# Patient Record
Sex: Male | Born: 1960 | Race: White | Hispanic: No | Marital: Married | Smoking: Former smoker
Health system: Southern US, Community
[De-identification: ages and names within clinical notes are randomized; demographics above are authoritative.]

## PROBLEM LIST (undated history)

## (undated) DIAGNOSIS — R519 Headache, unspecified: Secondary | ICD-10-CM

## (undated) DIAGNOSIS — F419 Anxiety disorder, unspecified: Secondary | ICD-10-CM

## (undated) DIAGNOSIS — N400 Enlarged prostate without lower urinary tract symptoms: Secondary | ICD-10-CM

## (undated) DIAGNOSIS — K7460 Unspecified cirrhosis of liver: Secondary | ICD-10-CM

## (undated) DIAGNOSIS — R413 Other amnesia: Secondary | ICD-10-CM

## (undated) DIAGNOSIS — E119 Type 2 diabetes mellitus without complications: Secondary | ICD-10-CM

## (undated) DIAGNOSIS — Z87442 Personal history of urinary calculi: Secondary | ICD-10-CM

## (undated) DIAGNOSIS — F32A Depression, unspecified: Secondary | ICD-10-CM

## (undated) DIAGNOSIS — M1990 Unspecified osteoarthritis, unspecified site: Secondary | ICD-10-CM

## (undated) DIAGNOSIS — N200 Calculus of kidney: Secondary | ICD-10-CM

## (undated) DIAGNOSIS — F329 Major depressive disorder, single episode, unspecified: Secondary | ICD-10-CM

## (undated) HISTORY — PX: BUNIONECTOMY: SHX129

## (undated) HISTORY — DX: Unspecified osteoarthritis, unspecified site: M19.90

## (undated) HISTORY — PX: PENILE ADHESIONS LYSIS: SHX2198

## (undated) HISTORY — DX: Major depressive disorder, single episode, unspecified: F32.9

## (undated) HISTORY — DX: Other amnesia: R41.3

## (undated) HISTORY — PX: KNEE SURGERY: SHX244

## (undated) HISTORY — DX: Calculus of kidney: N20.0

## (undated) HISTORY — DX: Benign prostatic hyperplasia without lower urinary tract symptoms: N40.0

## (undated) HISTORY — DX: Headache, unspecified: R51.9

## (undated) HISTORY — PX: TONSILLECTOMY AND ADENOIDECTOMY: SUR1326

---

## 2007-06-09 HISTORY — PX: COLONOSCOPY: SHX174

## 2008-02-17 ENCOUNTER — Encounter: Admission: RE | Admit: 2008-02-17 | Discharge: 2008-02-17 | Payer: Self-pay | Admitting: Urology

## 2008-02-22 ENCOUNTER — Ambulatory Visit (HOSPITAL_BASED_OUTPATIENT_CLINIC_OR_DEPARTMENT_OTHER): Admission: RE | Admit: 2008-02-22 | Discharge: 2008-02-22 | Payer: Self-pay | Admitting: Urology

## 2008-02-22 ENCOUNTER — Encounter (INDEPENDENT_AMBULATORY_CARE_PROVIDER_SITE_OTHER): Payer: Self-pay | Admitting: Urology

## 2009-06-01 IMAGING — CR DG CHEST 2V
2 series · 2 of 2 positions shown · non-contrast
Comparison: None

CLINICAL DATA: Preop chest x-ray, hematuria

CHEST - 2 VIEW

[w chest pa]
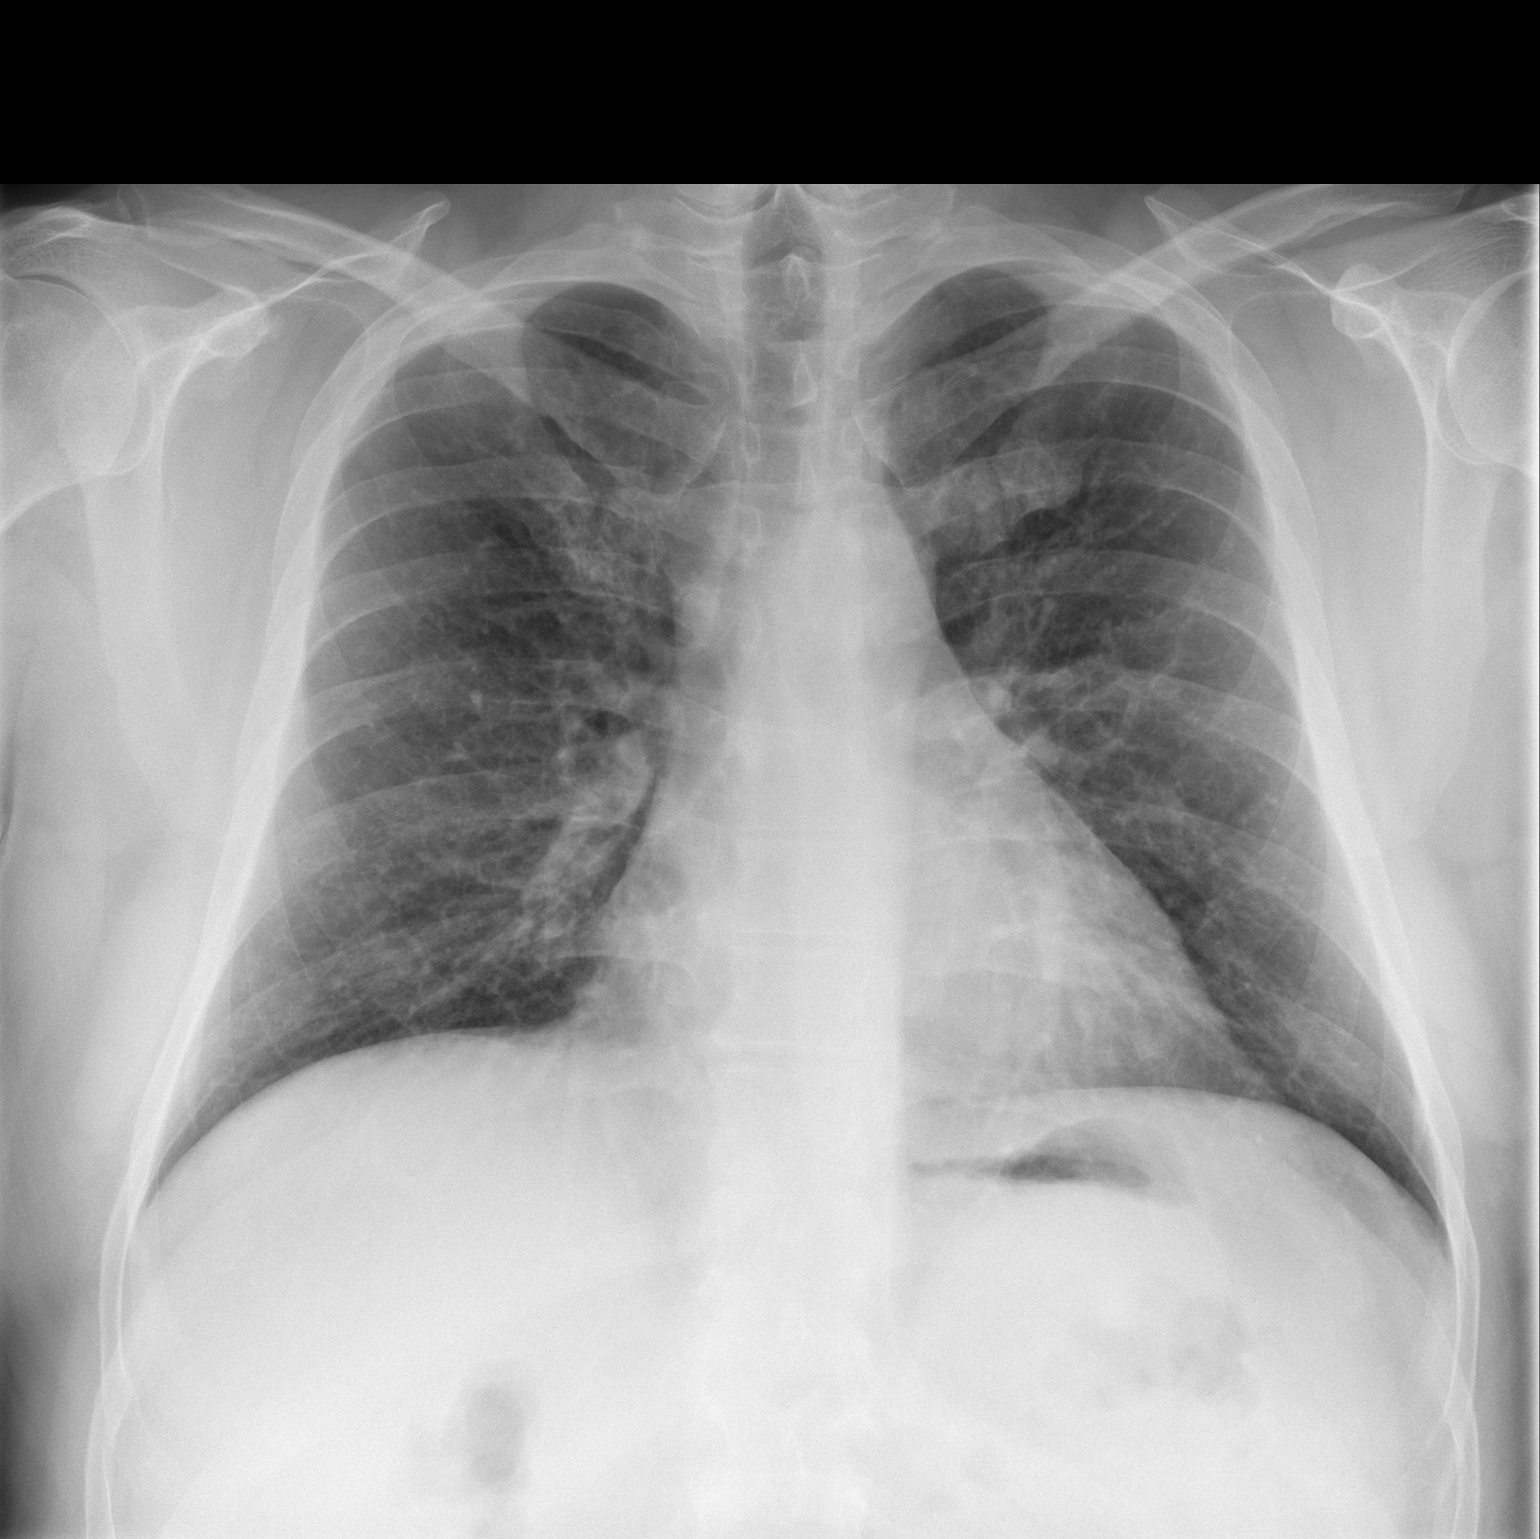

[w chest lat]
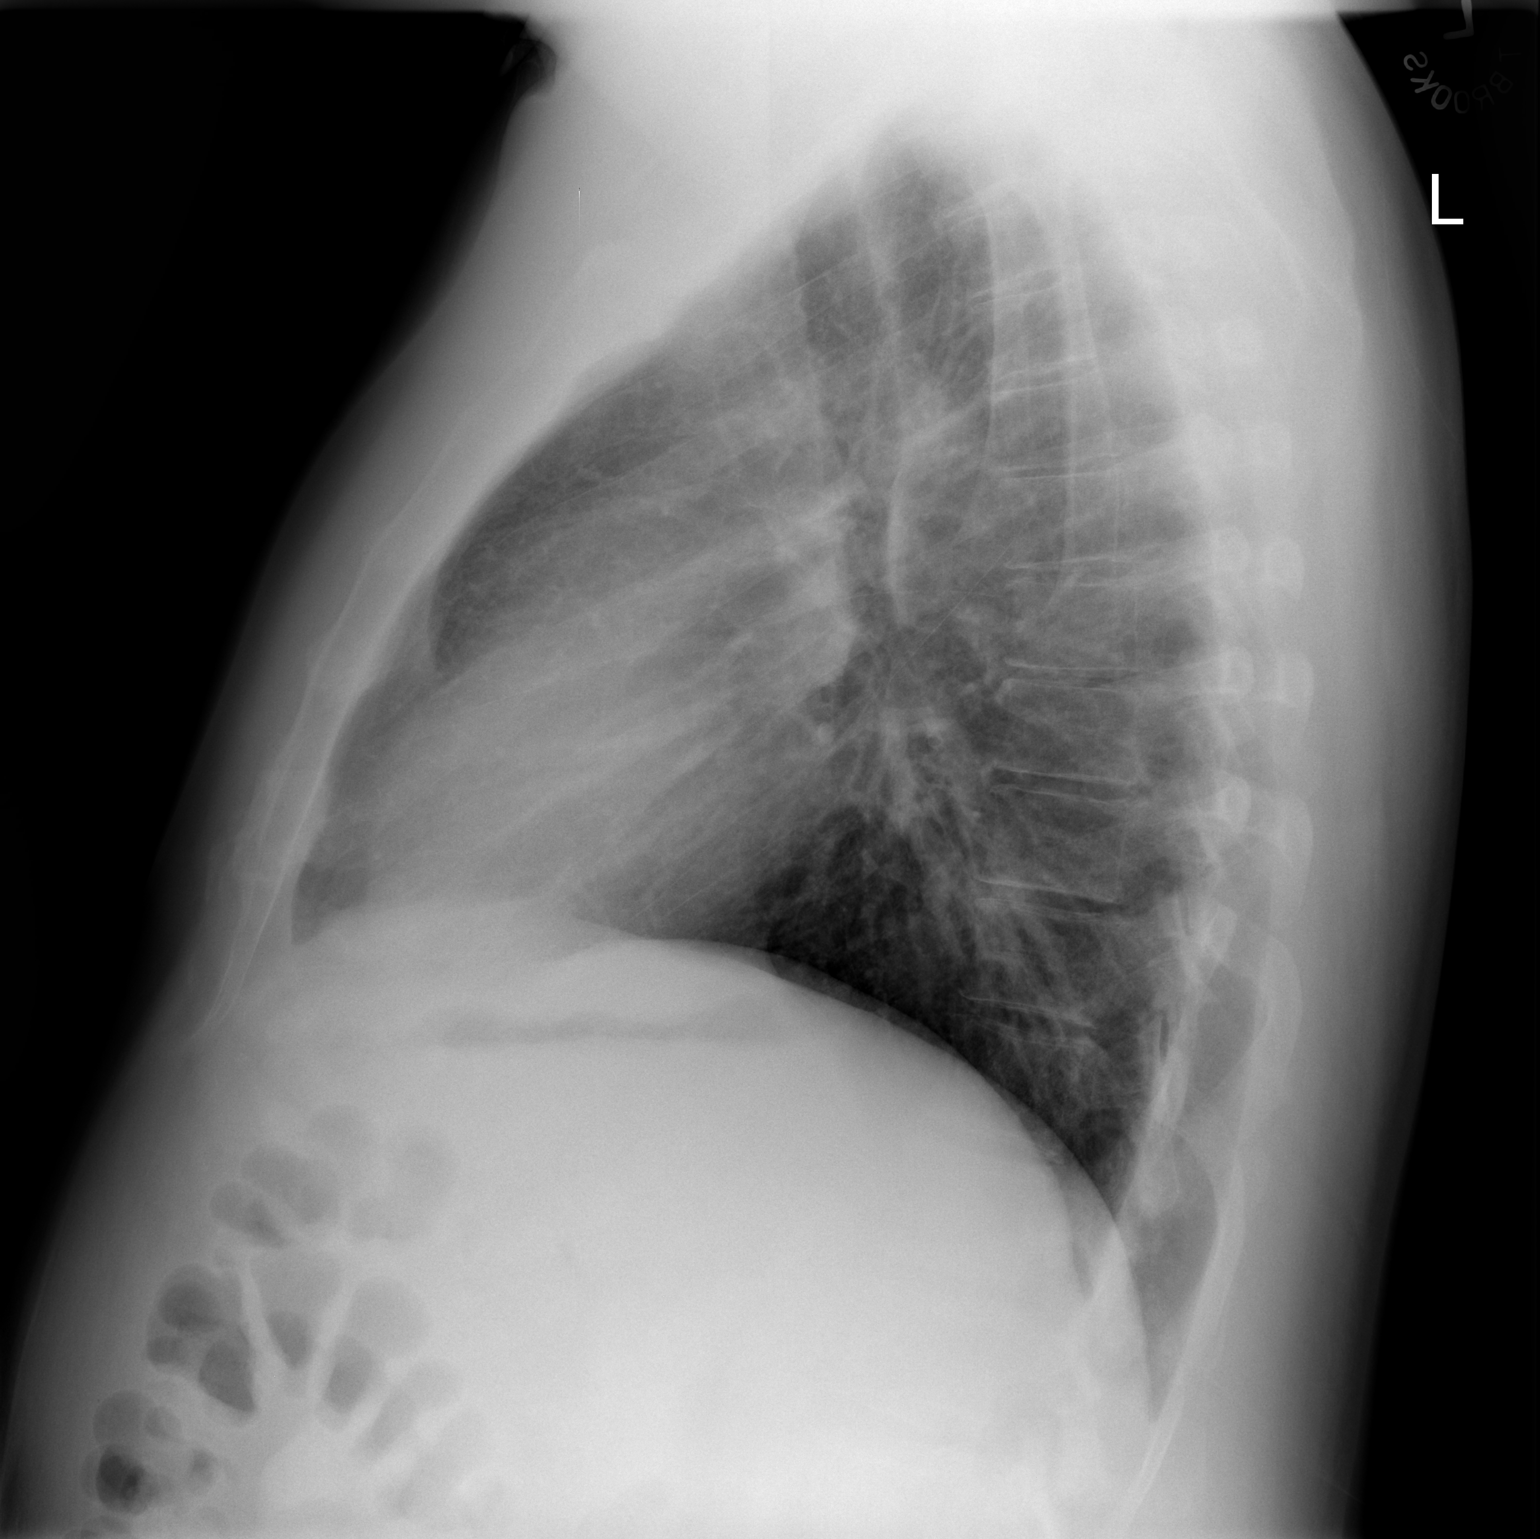

[2 of 2 positions shown; findings below may reference images not displayed]

FINDINGS: Two views the chest show lungs to be clear.  The heart is
within upper limits of normal.  No bony abnormality is seen.
IMPRESSION: No active lung disease.  Heart upper normal.

## 2011-02-17 NOTE — Op Note (Signed)
Malik Hill, Malik Hill              ACCOUNT NO.:  [id number]   MEDICAL RECORD NO.:  [id number]          PATIENT TYPE:  AMB   LOCATION:  [facility]                         FACILITY:  [facility]   PHYSICIAN:  Ronald L. Earlene Plater, M.D.  DATE OF BIRTH:  17-Jul-1961   DATE OF PROCEDURE:  02/22/2008  DATE OF DISCHARGE:                               OPERATIVE REPORT   DIAGNOSES:  1. Hematuria.  2. Positive cytology.  3. Urethral stricture disease.   OPERATIVE PROCEDURE:  1. Cystourethroscopy.  2. Dilatation of urethral stricture.  3. Bilateral renal pelvic washings.  4. Bilateral retrograde ureteropyelograms.  5. Bladder washings.  6. Placement of Foley catheter.   SURGEON:  Gaynelle Arabian, MD   ANESTHESIA:  LMA.   ESTIMATED BLOOD LOSS:  20 mL.   TUBES:  A 24-French 10-mL balloon Foley.   COMPLICATIONS:  None.   INDICATION FOR PROCEDURE:  Mr. Boydstun is a very nice 50 year old white  male who presented with some intermittent hematuria.  He underwent a  workup consisting of renal ultrasound and a CT scan that revealed really  no significant abnormalities.  He underwent an office cystoscopy which  revealed urethral stricture disease with no abnormalities.  However, he  had some cytologies that were positive.  After understanding risks,  benefits and alternatives, he has elected to proceed with the above  procedure.   PROCEDURE IN DETAIL:  The patient was placed in a supine position.  After proper LMA anesthesia, he was placed in the dorsal lithotomy  position, prepped and draped with Betadine in a sterile fashion.  Cystourethroscopy was performed with a 22.5-French Olympus panendoscope.  He was noted have a rather tight filamentous bulbar urethral stricture  that was at multiple levels for approximately a centimeter and a half.  A 0.03-French Sensor wire was placed past it and it was dilated to 75-  [place] with Heyman dilators.  Cystourethroscopy was then performed and  the bladder was  smooth-walled and efflux of clear urine was noted from  the normally placed ureteral orifices bilaterally.  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  The 6-French open-ended catheters were  placed into each renal pelvis and barbotage and right and left renal  cytologies were obtained with normal saline and submitted for cytology.  Right and left retrograde ureteropyelograms was then performed; both  appeared to have no filling defects, no hydronephrosis.  Both drained  well.  The entire ureter could be visualized and the orthotopic  ureterocele was noted near the right ureteral orifice, but there were no  lesions noted and both systems appeared to drain well.  Bladder washings  were then performed with normal saline and submitted to Pathology also.  The bladder was drained.  The panendoscope was visually removed and a 24-  French 10-mL balloon Foley was passed and the urine was noted to  irrigate clear.  The patient tolerated the procedure well with no  complications.  He was taken to the recovery room stable.      Ronald L. Earlene Plater, M.D.  Electronically Signed     RLD/MEDQ  D:  02/22/2008  T:  02/22/2008  Job:  [id number]

## 2011-05-27 ENCOUNTER — Encounter (INDEPENDENT_AMBULATORY_CARE_PROVIDER_SITE_OTHER): Payer: Self-pay

## 2011-07-01 LAB — BASIC METABOLIC PANEL
BUN: 5 — ABNORMAL LOW
Chloride: 105
GFR calc Af Amer: >60
GFR calc non Af Amer: >60

## 2011-07-01 LAB — CBC
HCT: 43.3
Hemoglobin: 14.6
RDW: 13.9

## 2011-07-01 LAB — URINALYSIS, ROUTINE W REFLEX MICROSCOPIC
Bilirubin Urine: NEGATIVE
Specific Gravity, Urine: 1.009
Urobilinogen, UA: 0.2

## 2013-10-05 HISTORY — PX: KNEE SURGERY: SHX244

## 2014-03-20 ENCOUNTER — Emergency Department (HOSPITAL_COMMUNITY): Payer: BC Managed Care – PPO

## 2014-03-20 ENCOUNTER — Encounter (HOSPITAL_COMMUNITY): Payer: Self-pay | Admitting: Emergency Medicine

## 2014-03-20 ENCOUNTER — Emergency Department (HOSPITAL_COMMUNITY)
Admission: EM | Admit: 2014-03-20 | Discharge: 2014-03-20 | Disposition: A | Discharge status: Home / Self Care | Payer: BC Managed Care – PPO | Attending: Emergency Medicine | Admitting: Emergency Medicine

## 2014-03-20 DIAGNOSIS — Z791 Long term (current) use of non-steroidal anti-inflammatories (NSAID): Secondary | ICD-10-CM | POA: Insufficient documentation

## 2014-03-20 DIAGNOSIS — Y9389 Activity, other specified: Secondary | ICD-10-CM | POA: Insufficient documentation

## 2014-03-20 DIAGNOSIS — Z79899 Other long term (current) drug therapy: Secondary | ICD-10-CM | POA: Insufficient documentation

## 2014-03-20 DIAGNOSIS — Z87891 Personal history of nicotine dependence: Secondary | ICD-10-CM | POA: Insufficient documentation

## 2014-03-20 DIAGNOSIS — S99929A Unspecified injury of unspecified foot, initial encounter: Principal | ICD-10-CM

## 2014-03-20 DIAGNOSIS — X500XXA Overexertion from strenuous movement or load, initial encounter: Secondary | ICD-10-CM | POA: Insufficient documentation

## 2014-03-20 DIAGNOSIS — Z8659 Personal history of other mental and behavioral disorders: Secondary | ICD-10-CM | POA: Insufficient documentation

## 2014-03-20 DIAGNOSIS — E119 Type 2 diabetes mellitus without complications: Secondary | ICD-10-CM | POA: Insufficient documentation

## 2014-03-20 DIAGNOSIS — M25561 Pain in right knee: Secondary | ICD-10-CM

## 2014-03-20 DIAGNOSIS — S8990XA Unspecified injury of unspecified lower leg, initial encounter: Secondary | ICD-10-CM | POA: Insufficient documentation

## 2014-03-20 DIAGNOSIS — Y929 Unspecified place or not applicable: Secondary | ICD-10-CM | POA: Insufficient documentation

## 2014-03-20 DIAGNOSIS — S99919A Unspecified injury of unspecified ankle, initial encounter: Principal | ICD-10-CM

## 2014-03-20 HISTORY — DX: Type 2 diabetes mellitus without complications: E11.9

## 2014-03-20 HISTORY — DX: Depression, unspecified: F32.A

## 2014-03-20 HISTORY — DX: Major depressive disorder, single episode, unspecified: F32.9

## 2014-03-20 HISTORY — DX: Anxiety disorder, unspecified: F41.9

## 2014-03-20 MED ORDER — NAPROXEN 500 MG PO TABS: 500.0000 mg | ORAL_TABLET | Freq: Two times a day (BID) | ORAL | Status: DC

## 2014-03-20 MED ORDER — HYDROCODONE-ACETAMINOPHEN 7.5-325 MG PO TABS: 1.0000 | ORAL_TABLET | Freq: Four times a day (QID) | ORAL | Status: DC | PRN

## 2014-03-20 NOTE — ED Notes (Signed)
Pt verbalized understanding of no driving and to use caution within 4 hours of taking pain med due to med causes drowsiness

## 2014-03-20 NOTE — ED Notes (Signed)
T. Triplett, PA at bedside. 

## 2014-03-20 NOTE — Discharge Instructions (Signed)
Knee Pain Knee pain can be a result of an injury or other medical conditions. Treatment will depend on the cause of your pain. HOME CARE  Only take medicine as told by your doctor.  Keep a healthy weight. Being overweight can make the knee hurt more.  Stretch before exercising or playing sports.  If there is constant knee pain, change the way you exercise. Ask your doctor for advice.  Make sure shoes fit well. Choose the right shoe for the sport or activity.  Protect your knees. Wear kneepads if needed.  Rest when you are tired. GET HELP RIGHT AWAY IF:   Your knee pain does not stop.  Your knee pain does not get better.  Your knee joint feels hot to the touch.  You have a fever. MAKE SURE YOU:   Understand these instructions.  Will watch this condition.  Will get help right away if you are not doing well or get worse. Document Released: 12/18/2008 Document Revised: 12/14/2011 Document Reviewed: 12/18/2008 Exit[employer] Patient Information 2014 [employer].  

## 2014-03-20 NOTE — ED Notes (Signed)
Pt lifting and heard a pop to right knee. Has been hurting since and limping some. Mild swelling noted.

## 2014-03-23 NOTE — ED Provider Notes (Signed)
Medical screening examination/treatment/procedure(s) were performed by non-physician practitioner and as supervising physician I was immediately available for consultation/collaboration.   EKG Interpretation None        Joseph L Zammit, MD 03/23/14 1547 

## 2014-03-23 NOTE — ED Provider Notes (Signed)
CSN: [id number]     Arrival date & time 03/20/14  1249 History   First MD Initiated Contact with Patient 03/20/14 1341     Chief Complaint  Patient presents with  . Knee Pain     (Consider location/radiation/quality/duration/timing/severity/associated sxs/prior Treatment) Patient is a 53 y.o. male presenting with knee pain. The history is provided by the patient.  Knee Pain Location:  Knee Time since incident:  1 day Injury: yes   Mechanism of injury comment:  Bending and lifting when he heard a "pop" to the right knee Knee location:  R knee Pain details:    Quality:  Aching and throbbing   Radiates to:  Does not radiate   Severity:  Moderate   Onset quality:  Sudden   Timing:  Constant   Progression:  Unchanged Chronicity:  New Dislocation: no   Foreign body present:  No foreign bodies Prior injury to area:  No Relieved by:  Rest Worsened by:  Activity, bearing weight and flexion Ineffective treatments:  NSAIDs Associated symptoms: decreased ROM and swelling   Associated symptoms: no back pain, no fever, no neck pain, no numbness, no stiffness and no tingling     Past Medical History  Diagnosis Date  . Diabetes mellitus without complication   . Depression   . Anxiety    Past Surgical History  Procedure Laterality Date  . Colonoscopy  06/09/2007  . Knee surgery Left    History reviewed. No pertinent family history. History  Substance Use Topics  . Smoking status: Former Games developermoker  . Smokeless tobacco: Not on file  . Alcohol Use: No    Review of Systems  Constitutional: Negative for fever and chills.  Gastrointestinal: Negative for nausea and vomiting.  Genitourinary: Negative for dysuria and difficulty urinating.  Musculoskeletal: Positive for arthralgias. Negative for back pain, joint swelling, neck pain and stiffness.       Right knee pain and swelling  Skin: Negative for color change and wound.  Neurological: Negative for dizziness, weakness,  light-headedness and numbness.  All other systems reviewed and are negative.     Allergies  Review of patient's allergies indicates no known allergies.  Home Medications   Prior to Admission medications   Medication Sig Start Date End Date Taking? Authorizing Provider  HYDROcodone-acetaminophen (NORCO) 7.5-325 MG per tablet Take 1 tablet by mouth every 6 (six) hours as needed for moderate pain. 03/20/14   Tammy L. Triplett, PA-C  naproxen (NAPROSYN) 500 MG tablet Take 1 tablet (500 mg total) by mouth 2 (two) times daily. 03/20/14   Tammy L. Triplett, PA-C   BP 140/86  Pulse 100  Temp(Src) 98.1 F (36.7 C) (Oral)  Resp 18  Ht 6\' 5"  (1.956 m)  Wt 287 lb (130.182 kg)  BMI 34.03 kg/m2  SpO2 96% Physical Exam  Nursing note and vitals reviewed. Constitutional: He is oriented to person, place, and time. He appears well-developed and well-nourished. No distress.  HENT:  Head: Normocephalic and atraumatic.  Cardiovascular: Normal rate, regular rhythm, normal heart sounds and intact distal pulses.   No murmur heard. Pulmonary/Chest: Effort normal and breath sounds normal. No respiratory distress.  Musculoskeletal: He exhibits edema and tenderness.       Right knee: He exhibits swelling. He exhibits no ecchymosis, no deformity, no laceration, no erythema and normal alignment. Tenderness found. No patellar tendon tenderness noted.       Legs: Localized ttp of the medial upper right knee . Mild to moderate STS present. No erythema,  or step-off deformity.  DP pulse brisk, distal sensation intact. Calf is soft and NT. Compartments of the right leg soft  Neurological: He is alert and oriented to person, place, and time. He exhibits normal muscle tone. Coordination normal.  Skin: Skin is warm and dry. No erythema.    ED Course  Procedures (including critical care time) Labs Review Labs Reviewed - No data to display  Imaging Review Dg Knee Complete 4 Views Right  03/20/2014   CLINICAL  DATA:  Pain post trauma  EXAM: RIGHT KNEE - COMPLETE 4+ VIEW  COMPARISON:  None.  FINDINGS: Frontal, lateral, and bilateral oblique views were obtained. There is a moderate joint effusion. There is no demonstrable fracture or dislocation. There is spurring along the anterior superior patella. There is no appreciable joint space narrowing. There is minimal spurring medially.  IMPRESSION: There is a moderate joint effusion. No fracture is appreciable. There is mild osteoarthritic change.   Electronically Signed   By: Bretta BangWilliam  Woodruff M.D.   On: 03/20/2014 14:16    EKG Interpretation None      MDM   Final diagnoses:  Knee pain, right    XR discussed with pt.  Sx's are concerning for possible quad tendon/ muscle injury.  Plan includes knee immobilizer, ice, elevate and close f/u with orthopedics.  Referral info given.  Pt agrees to arrange f/u with Dr. Romeo AppleHarrison for latera this week.  Also advised pt to return here if sx's worsen, he agrees to plan.  rx for naprosyn and vicodin.  Knee immob applied, pain improved, remains NV intact.      Tammy L. Trisha Mangleriplett, PA-C 03/23/14 1517

## 2016-06-23 ENCOUNTER — Emergency Department (HOSPITAL_COMMUNITY)
Admission: EM | Admit: 2016-06-23 | Discharge: 2016-06-23 | Disposition: A | Discharge status: Home / Self Care | Payer: BLUE CROSS/BLUE SHIELD | Attending: Dermatology | Admitting: Dermatology

## 2016-06-23 ENCOUNTER — Encounter (HOSPITAL_COMMUNITY): Payer: Self-pay | Admitting: Emergency Medicine

## 2016-06-23 DIAGNOSIS — Z5321 Procedure and treatment not carried out due to patient leaving prior to being seen by health care provider: Secondary | ICD-10-CM | POA: Insufficient documentation

## 2016-06-23 DIAGNOSIS — Z87891 Personal history of nicotine dependence: Secondary | ICD-10-CM | POA: Insufficient documentation

## 2016-06-23 DIAGNOSIS — E119 Type 2 diabetes mellitus without complications: Secondary | ICD-10-CM | POA: Insufficient documentation

## 2016-06-23 DIAGNOSIS — R319 Hematuria, unspecified: Secondary | ICD-10-CM | POA: Insufficient documentation

## 2016-06-23 NOTE — ED Triage Notes (Signed)
Pt feel in April and broke a vertabrea in his back and has been going to a Md regularly. Pt has been seeing a Landchiropractor.  Pt is complaining of lower back pain. Pt stated that when he went to the bathroom this morning and had blood in his urine. Pt states that he had a large kidney stone back in April but he has not passed it since then. Pt states that he is having lower back pain.

## 2016-06-23 NOTE — ED Notes (Signed)
Pt told staff he was going to his pcp.

## 2016-08-10 ENCOUNTER — Emergency Department (HOSPITAL_COMMUNITY)
Admission: EM | Admit: 2016-08-10 | Discharge: 2016-08-10 | Disposition: A | Discharge status: Home / Self Care | Payer: BLUE CROSS/BLUE SHIELD | Attending: Emergency Medicine | Admitting: Emergency Medicine

## 2016-08-10 ENCOUNTER — Encounter (HOSPITAL_COMMUNITY): Payer: Self-pay | Admitting: Emergency Medicine

## 2016-08-10 ENCOUNTER — Emergency Department (HOSPITAL_COMMUNITY): Payer: BLUE CROSS/BLUE SHIELD

## 2016-08-10 DIAGNOSIS — R161 Splenomegaly, not elsewhere classified: Secondary | ICD-10-CM | POA: Diagnosis not present

## 2016-08-10 DIAGNOSIS — R1013 Epigastric pain: Secondary | ICD-10-CM

## 2016-08-10 DIAGNOSIS — N200 Calculus of kidney: Secondary | ICD-10-CM

## 2016-08-10 DIAGNOSIS — R110 Nausea: Secondary | ICD-10-CM | POA: Diagnosis not present

## 2016-08-10 DIAGNOSIS — R634 Abnormal weight loss: Secondary | ICD-10-CM | POA: Insufficient documentation

## 2016-08-10 DIAGNOSIS — R630 Anorexia: Secondary | ICD-10-CM | POA: Diagnosis not present

## 2016-08-10 DIAGNOSIS — E119 Type 2 diabetes mellitus without complications: Secondary | ICD-10-CM | POA: Insufficient documentation

## 2016-08-10 DIAGNOSIS — Z87891 Personal history of nicotine dependence: Secondary | ICD-10-CM | POA: Diagnosis not present

## 2016-08-10 LAB — I-STAT TROPONIN, ED: TROPONIN I, POC: 0 ng/mL (ref 0.00–0.08)

## 2016-08-10 LAB — URINALYSIS, ROUTINE W REFLEX MICROSCOPIC
Bilirubin Urine: NEGATIVE
GLUCOSE, UA: NEGATIVE mg/dL
HGB URINE DIPSTICK: NEGATIVE
Ketones, ur: NEGATIVE mg/dL
Nitrite: NEGATIVE
PH: 5.5 (ref 5.0–8.0)
Protein, ur: NEGATIVE mg/dL
SPECIFIC GRAVITY, URINE: 1.017 (ref 1.005–1.030)

## 2016-08-10 LAB — CBC
HEMATOCRIT: 44.4 % (ref 39.0–52.0)
HEMOGLOBIN: 14.6 g/dL (ref 13.0–17.0)
MCH: 28.1 pg (ref 26.0–34.0)
MCHC: 32.9 g/dL (ref 30.0–36.0)
MCV: 85.4 fL (ref 78.0–100.0)
Platelets: 222 10*3/uL (ref 150–400)
RBC: 5.2 MIL/uL (ref 4.22–5.81)
RDW: 13.6 % (ref 11.5–15.5)
WBC: 5.8 10*3/uL (ref 4.0–10.5)

## 2016-08-10 LAB — COMPREHENSIVE METABOLIC PANEL
ALBUMIN: 4.5 g/dL (ref 3.5–5.0)
ALT: 36 U/L (ref 17–63)
ANION GAP: 7 (ref 5–15)
AST: 31 U/L (ref 15–41)
Alkaline Phosphatase: 60 U/L (ref 38–126)
BUN: 13 mg/dL (ref 6–20)
CHLORIDE: 103 mmol/L (ref 101–111)
CO2: 24 mmol/L (ref 22–32)
Calcium: 9.3 mg/dL (ref 8.9–10.3)
Creatinine, Ser: 1.08 mg/dL (ref 0.61–1.24)
GFR calc non Af Amer: >60 mL/min (ref >60)
Glucose, Bld: 80 mg/dL (ref 65–99)
Potassium: 3.6 mmol/L (ref 3.5–5.1)
SODIUM: 134 mmol/L — AB (ref 135–145)
Total Bilirubin: 1 mg/dL (ref 0.3–1.2)
Total Protein: 7.7 g/dL (ref 6.5–8.1)

## 2016-08-10 LAB — URINE MICROSCOPIC-ADD ON
BACTERIA UA: NONE SEEN
RBC / HPF: NONE SEEN RBC/hpf (ref 0–5)
Squamous Epithelial / LPF: NONE SEEN

## 2016-08-10 LAB — LIPASE, BLOOD: LIPASE: 41 U/L (ref 11–51)

## 2016-08-10 MED ORDER — ONDANSETRON 8 MG PO TBDP: 8.0000 mg | ORAL_TABLET | Freq: Three times a day (TID) | ORAL | 0 refills | Status: DC | PRN

## 2016-08-10 MED ORDER — ONDANSETRON 8 MG PO TBDP
8.0000 mg | ORAL_TABLET | Freq: Once | ORAL | Status: AC
  Administered 2016-08-10: 8 mg via ORAL
  Filled 2016-08-10: qty 1

## 2016-08-10 MED ORDER — GI COCKTAIL ~~LOC~~
30.0000 mL | Freq: Once | ORAL | Status: AC
  Administered 2016-08-10: 30 mL via ORAL
  Filled 2016-08-10: qty 30

## 2016-08-10 NOTE — ED Triage Notes (Signed)
Pt reports abd x 1 month, was seen by PCP , was prescribed Protonix and other meds for acid reflux. Pt reports no relief and pain is persistent in LLQ. Hx kidney stone yet reports doesn't feel like one . Also reports oliguria. Denies dysuria.  Reports nausea yet no emesis.

## 2016-08-10 NOTE — ED Provider Notes (Signed)
[facility]-EMERGENCY DEPT Provider Note   CSN: [id number] Arrival date & time: 08/10/16  1239     History   Chief Complaint Chief Complaint  Patient presents with  . Abdominal Pain    HPI Malik Hill is a 55 y.o. male with a PMHx of anxiety, depression, nephrolithiasis, urethral strictures s/p repair, IBS, and DM2, who presents to the ED with complaints of 6 weeks of epigastric abdominal pain. Patient states that several months ago he broke of vertebrae in his back, was prescribed Percocet which he was on for several weeks, and when he came off of it his symptoms started and have persisted since then. He went to his PCPs office on Monday last week and was told that he likely had an ulcer so he was prescribed Protonix and Carafate, which has helped, but this afternoon the pain returned and was more severe so he came to the emergency room. Patient states that he thought that all of his symptoms were related to coming off the Percocet, and being very anxious, but this morning it concerned him in the pain was worse than it had been. He describes his pain as 6/10 intermittent burning in the epigastrium radiating into the left upper quadrant worse with anxiety and stress as well as eating, and improved with Protonix and Carafate. He has not tried anything else. States that currently his pain has resolved. Associated symptoms include nausea and decreased appetite. He states that he hasn't been eating much and has lost approximately 12 pounds in the last 6 weeks.   He denies any fevers, chills, chest pain, shortness breath, vomiting, diarrhea, constipation, melena, hematochezia, obstipation, dysuria, hematuria, numbness, tingling, focal weakness, recent travel, antibiotic use, sick contacts, suspicious food intake, alcohol use, chronic NSAID use, or prior abdominal surgeries. He last ate 2 pieces of toast, a boost drink, and some bacon at 7 AM. His GI specialist is Dr. Dionicia Ableraman, last had a colonoscopy ~8154yrs ago,  states he's supposed to go every 2-3 yrs to have them because his sister had colon cancer so he's high risk.    The history is provided by the patient and medical records. No language interpreter was used.  Abdominal Pain   This is a recurrent problem. The current episode started more than 1 week ago. The problem occurs daily. The problem has been gradually improving. The pain is associated with eating. The pain is located in the epigastric region. The quality of the pain is burning. The pain is at a severity of 6/10. The pain is moderate. Associated symptoms include nausea. Pertinent negatives include fever, diarrhea, flatus, hematochezia, melena, vomiting, constipation, dysuria, hematuria, arthralgias and myalgias. The symptoms are aggravated by eating (and stress/anxiety). Relieved by: protonix and carafate. His past medical history is significant for irritable bowel syndrome.    Past Medical History:  Diagnosis Date  . Anxiety   . Depression   . Diabetes mellitus without complication (HCC)     There are no active problems to display for this patient.   Past Surgical History:  Procedure Laterality Date  . COLONOSCOPY  06/09/2007  . KNEE SURGERY Left   . PENILE ADHESIONS LYSIS         Home Medications    Prior to Admission medications   Medication Sig Start Date End Date Taking? Authorizing Provider  HYDROcodone-acetaminophen (NORCO) 7.5-325 MG per tablet Take 1 tablet by mouth every 6 (six) hours as needed for moderate pain. 03/20/14   Tammy Triplett, PA-C  naproxen (NAPROSYN) 500 MG  tablet Take 1 tablet (500 mg total) by mouth 2 (two) times daily. 03/20/14   Tammy Triplett, PA-C    Family History No family history on file.  Social History Social History  Substance Use Topics  . Smoking status: Former Games developermoker  . Smokeless tobacco: Never Used  . Alcohol use No     Allergies   Penicillins   Review of Systems Review of Systems  Constitutional: Positive for appetite  change (decreased) and unexpected weight change (12# in 6 wks). Negative for chills and fever.  Respiratory: Negative for shortness of breath.   Cardiovascular: Negative for chest pain.  Gastrointestinal: Positive for abdominal pain and nausea. Negative for blood in stool, constipation, diarrhea, flatus, hematochezia, melena and vomiting.  Genitourinary: Negative for dysuria and hematuria.  Musculoskeletal: Negative for arthralgias and myalgias.  Skin: Negative for color change.  Allergic/Immunologic: Positive for immunocompromised state (DM2).  Neurological: Negative for weakness and numbness.  Psychiatric/Behavioral: Negative for confusion.   10 Systems reviewed and are negative for acute change except as noted in the HPI.   Physical Exam Updated Vital Signs BP 128/91   Pulse 104   Temp 98.5 F (36.9 C) (Oral)   Resp 18   Wt 103.4 kg   SpO2 100%   BMI 27.04 kg/m   Physical Exam  Constitutional: He is oriented to person, place, and time. Vital signs are normal. He appears well-developed and well-nourished.  Non-toxic appearance. No distress.  Afebrile, nontoxic, NAD  HENT:  Head: Normocephalic and atraumatic.  Mouth/Throat: Oropharynx is clear and moist and mucous membranes are normal.  Eyes: Conjunctivae and EOM are normal. Right eye exhibits no discharge. Left eye exhibits no discharge.  Neck: Normal range of motion. Neck supple.  Cardiovascular: Normal rate, regular rhythm, normal heart sounds and intact distal pulses.  Exam reveals no gallop and no friction rub.   No murmur heard. Pulmonary/Chest: Effort normal and breath sounds normal. No respiratory distress. He has no decreased breath sounds. He has no wheezes. He has no rhonchi. He has no rales.  Abdominal: Soft. Normal appearance and bowel sounds are normal. He exhibits no distension. There is tenderness in the right upper quadrant and epigastric area. There is no rigidity, no rebound, no guarding, no CVA tenderness, no  tenderness at McBurney's point and negative Murphy's sign (elicits pain but still able to inspire).    Soft, nondistended, +BS throughout, with moderate RUQ and epigastric TTP, no r/g/r, pain elicited with murphy's exam but pt still able to fully inspire, neg mcburney's, no CVA TTP   Musculoskeletal: Normal range of motion.  Neurological: He is alert and oriented to person, place, and time. He has normal strength. No sensory deficit.  Skin: Skin is warm, dry and intact. No rash noted.  Psychiatric: He has a normal mood and affect.  Nursing note and vitals reviewed.    ED Treatments / Results  Labs (all labs ordered are listed, but only abnormal results are displayed) Labs Reviewed  COMPREHENSIVE METABOLIC PANEL - Abnormal; Notable for the following:       Result Value   Sodium 134 (*)    All other components within normal limits  URINALYSIS, ROUTINE W REFLEX MICROSCOPIC (NOT AT [facility]) - Abnormal; Notable for the following:    Leukocytes, UA TRACE (*)    All other components within normal limits  URINE CULTURE  LIPASE, BLOOD  CBC  URINE MICROSCOPIC-ADD ON  I-STAT TROPOININ, ED    EKG  EKG Interpretation  Date/Time:  Monday August 10 2016 17:23:52 EST Ventricular Rate:  95 PR Interval:    QRS Duration: 90 QT Interval:  341 QTC Calculation: 429 R Axis:   80 Text Interpretation:  Sinus rhythm Borderline T wave abnormalities Confirmed by Ranae Palms  MD, DAVID ([id number]) on 08/10/2016 6:38:43 PM       Radiology US Abdomen Complete  Result Date: 08/10/2016 CLINICAL DATA:  Epigastric and left upper quadrant abdominal pain x1 week EXAM: ABDOMEN ULTRASOUND COMPLETE COMPARISON:  CT abdomen/ pelvis dated 07/27/2010 FINDINGS: Gallbladder: No gallstones, gallbladder wall thickening, or pericholecystic fluid. Negative sonographic Murphy's sign. Common bile duct: Diameter: 4 mm Liver: Hyperechoic hepatic parenchyma. No focal hepatic lesion is seen. Incomplete visualization of left hepatic  lobe due to bowel gas. IVC: Poorly visualized due to overlying bowel gas. Pancreas: Not visualized due to overlying bowel gas. Spleen: Splenomegaly, measuring 14.3 x 15.8 x 8.5 cm (calculated volume 1001 mL). Right Kidney: Length: 12.4 cm.  No mass or hydronephrosis. Left Kidney: Length: 13.9 cm. 9 mm lower pole calculus. No hydronephrosis. Abdominal aorta: No aneurysm visualized. Other findings: None. IMPRESSION: Hyperechoic hepatic parenchyma, suggesting hepatic steatosis. Splenomegaly, measuring up to 15.8 cm in max dimension (calculated volume 1001 mL). 9 mm left lower pole renal calculus.  No hydronephrosis. Electronically Signed   By: Charline Bills M.D.   On: 08/10/2016 17:22    Procedures Procedures (including critical care time)  Medications Ordered in ED Medications  ondansetron (ZOFRAN-ODT) disintegrating tablet 8 mg (8 mg Oral Given 08/10/16 1641)  gi cocktail (Maalox,Lidocaine,Donnatal) (30 mLs Oral Given 08/10/16 1642)     Initial Impression / Assessment and Plan / ED Course  I have reviewed the triage vital signs and the nursing notes.  Pertinent labs & imaging results that were available during my care of the patient were reviewed by me and considered in my medical decision making (see chart for details).  Clinical Course     55 y.o. male here with ongoing epigastric pain and nausea, loss of appetite and subsequent weight loss x6 wks, intermittent with times of anxiety/stress; PCP diagnosed him with presumptive ulcer, started on carafate and protonix which has helped, but pain this morning was more severe so he came to the ER. Pain completely resolved upon arrival, declines wanting anything for pain. On exam, epigastric and RUQ TTP, neg murphy's but elicits pain during murphy's exam. Lipase WNL, CMP unremarkable, CBC WNL; U/A pending. Will add-on EKG and troponin to ensure this is not a cardiac etiology; will also get U/A. Will give GI cocktail and zofran, and reassess  shortly  7:17 PM U/A with few WBCs but otherwise unremarkable, doubt UTI, will send for culture. Trop neg. EKG unremarkable. U/S with splenomegaly and L lower renal pole calculus, and some findings of hepatic steatosis (the latter two are findings pt knew about already, chronic and unchanged per his reports). Pt feeling much better, able to tolerate PO well here. Discussed that he needs to see his GI specialist for likely EGD to eval for PUD/gastritis/etc which is what his symptoms seem to be consistent with, and for his colonoscopy since he's due for that and due to the weight loss and FHx of GI cancer this would be indicated for further evaluation; also to f/up on the splenomegaly finding seen on U/S today. Doubt need for further emergent work up for this right now. Will d/c home with zofran, continue carafate and protonix, tylenol and OTC meds for symptom control, diet modifications discussed, f/up with GI in 1wk  for ongoing eval and management. I explained the diagnosis and have given explicit precautions to return to the ER including for any other new or worsening symptoms. The patient understands and accepts the medical plan as it's been dictated and I have answered their questions. Discharge instructions concerning home care and prescriptions have been given. The patient is STABLE and is discharged to home in good condition.   Final Clinical Impressions(s) / ED Diagnoses   Final diagnoses:  Epigastric abdominal pain  Nausea  Splenomegaly  Decreased appetite  Weight loss, unintentional  Left nephrolithiasis    New Prescriptions New Prescriptions   ONDANSETRON (ZOFRAN ODT) 8 MG DISINTEGRATING TABLET    Take 1 tablet (8 mg total) by mouth every 8 (eight) hours as needed for nausea or vomiting.     France Ravens Camprubi-Soms, PA-C 08/10/16 1933    Loren Racer, MD 08/10/16 [phone]

## 2016-08-10 NOTE — Discharge Instructions (Signed)
Your abdominal pain is likely from gastritis or an ulcer, but could be from other causes and you will need to follow up for further evaluation. Your ultrasound today revealed an enlarged spleen, you will need to see your GI doctor for evaluation of this as well. Continue taking your carafate and protonix as directed. You may use over the counter zantac for additional relief, and avoid spicy/fatty/acidic foods, avoid soda/coffee/tea/alcohol. Avoid laying down flat within 30 minutes of eating. Avoid NSAIDs like ibuprofen/aleve/motrin/etc on an empty stomach. May consider using over the counter tums/maalox as needed for additional relief. Use zofran as directed as needed for nausea. Use tylenol as needed for pain. Follow up with your gastroenterologist in one week for ongoing evaluation of your abdominal pain and enlarged spleen. Return to the ER for changes or worsening symptoms.  Abdominal (belly) pain can be caused by many things. Your caregiver performed an examination and possibly ordered blood/urine tests and imaging (CT scan, x-rays, ultrasound). Many cases can be observed and treated at home after initial evaluation in the emergency department. Even though you are being discharged home, abdominal pain can be unpredictable. Therefore, you need a repeated exam if your pain does not resolve, returns, or worsens. Most patients with abdominal pain don't have to be admitted to the hospital or have surgery, but serious problems like appendicitis and gallbladder attacks can start out as nonspecific pain. Many abdominal conditions cannot be diagnosed in one visit, so follow-up evaluations are very important. SEEK IMMEDIATE MEDICAL ATTENTION IF YOU DEVELOP ANY OF THE FOLLOWING SYMPTOMS: The pain does not go away or becomes severe.  A temperature above 101 develops.  Repeated vomiting occurs (multiple episodes).  The pain becomes localized to portions of the abdomen. The right side could possibly be appendicitis.  In an adult, the left lower portion of the abdomen could be colitis or diverticulitis.  Blood is being passed in stools or vomit (bright red or black tarry stools).  Return also if you develop chest pain, difficulty breathing, dizziness or fainting, or become confused, poorly responsive, or inconsolable (young children). The constipation stays for more than 4 days.  There is belly (abdominal) or rectal pain.  You do not seem to be getting better.

## 2016-08-10 NOTE — ED Notes (Signed)
Ultrasound at bedside

## 2016-08-12 LAB — URINE CULTURE: CULTURE: NO GROWTH

## 2017-03-25 ENCOUNTER — Encounter: Payer: Self-pay | Admitting: Registered"

## 2017-03-25 ENCOUNTER — Encounter: Payer: BC Managed Care – PPO | Attending: Family Medicine | Admitting: Registered"

## 2017-03-25 DIAGNOSIS — E119 Type 2 diabetes mellitus without complications: Secondary | ICD-10-CM | POA: Diagnosis not present

## 2017-03-25 DIAGNOSIS — Z713 Dietary counseling and surveillance: Secondary | ICD-10-CM | POA: Insufficient documentation

## 2017-03-25 DIAGNOSIS — E118 Type 2 diabetes mellitus with unspecified complications: Secondary | ICD-10-CM

## 2017-03-25 DIAGNOSIS — I10 Essential (primary) hypertension: Secondary | ICD-10-CM | POA: Diagnosis not present

## 2017-03-25 NOTE — Progress Notes (Signed)
Diabetes Self-Management Education  Visit Type: First/Initial  Appt. Start Time: 1030 Appt. End Time: 1200  03/25/2017  Mr. Lorette AngJeffrey Femia,  identified by name and date of birth, is a 56 y.o. male with a diagnosis of Diabetes: Type 2.   ASSESSMENT Pt ([facility] [employer] states he was reluctant to come in because he believed he would be given a restrictive diet that would be hard to follow and take the enjoyment out of life. Pt states he doesn't want to admit to having diabetes and honest about his denial.   Pt states he has been taking metformin for about 10 yrs. Next visit RD may recommended B12 supplement.  Pt states he was waking up sweating and a friend suggested he eat a little PB before bed which he started and reports that his symptoms have subsided.  Pt has limited activity due to back injury last year. Pt states he and his wife walks [employer] every day. Pt states he would like to have a goal of more vigorous activity 30 min per day.      Diabetes Self-Management Education - 03/25/17 1048      Visit Information   Visit Type First/Initial     Initial Visit   Diabetes Type Type 2   Are you currently following a meal plan? No   Are you taking your medications as prescribed? Yes   Date Diagnosed 10 years      Health Coping   How would you rate your overall health? Fair     Psychosocial Assessment   Patient Belief/Attitude about Diabetes Denial   How often do you need to have someone help you when you read instructions, pamphlets, or other written materials from your doctor or pharmacy? 1 - Never   What is the last grade level you completed in school? 12     Complications   Last HgB A1C per patient/outside source 7.8 %   How often do you check your blood sugar? 1-2 times/day   Fasting Blood glucose range (mg/dL) [phone]  [phone]   Number of hypoglycemic episodes per month 2  feels symptoms in high 70s   Number of hyperglycemic episodes per week 2   Can you tell when your  blood sugar is high? No   Have you had a dilated eye exam in the past 12 months? Yes   Have you had a dental exam in the past 12 months? Yes   Are you checking your feet? Yes   How many days per week are you checking your feet? 4     Dietary Intake   Breakfast biscuit sausage egg   Snack (morning) almonds OR nabs   Lunch starving - [place] OR salad, chicken sour cream   Snack (afternoon) none   [employer] restaurants. fried flounder, fries, coleslaw OR sub OR meat, starch, veggie, 1 choc cookie   Snack (evening) spoonful of PB to prevent night sweats   Beverage(s) water, 1 diet drink     Exercise   Exercise Type Light (walking / raking leaves)   How many days per week to you exercise? 7   How many minutes per day do you exercise? 15   Total minutes per week of exercise 105     Patient Education   Previous Diabetes Education No   Disease state  Definition of diabetes, type 1 and 2, and the diagnosis of diabetes;Factors that contribute to the development of diabetes   Nutrition management  Role of diet in the treatment  of diabetes and the relationship between the three main macronutrients and blood glucose level;Reviewed blood glucose goals for pre and post meals and how to evaluate the patients' food intake on their blood glucose level.   Physical activity and exercise  Role of exercise on diabetes management, blood pressure control and cardiac health.;Identified with patient nutritional and/or medication changes necessary with exercise.   Monitoring Identified appropriate SMBG and/or A1C goals.   Acute complications Taught treatment of hypoglycemia - the 15 rule.   Chronic complications Relationship between chronic complications and blood glucose control     Individualized Goals (developed by patient)   Nutrition General guidelines for healthy choices and portions discussed   Physical Activity Exercise 5-7 days per week     Outcomes   Expected Outcomes Demonstrated interest in  learning. Expect positive outcomes   Future DMSE 4-6 wks   Program Status Not Completed      Individualized Plan for Diabetes Self-Management Training:   Learning Objective:  Patient will have a greater understanding of diabetes self-management. Patient education plan is to attend individual and/or group sessions per assessed needs and concerns.  Patient Instructions  Plan:   Include protein with your meals and snacks  Aim to eat mindful and enjoy food and the eating experiencing. Aim for 20 min for meal.  Consider increasing your activity daily as tolerated  Consider checking BG a few time during the week 2 hours after a meal.   Continue taking medication as directed by MD  Continue doing things you like such as yard work as tolerated  Consider keeping glucose tabs in car to treat low-blood sugar  Expected Outcomes:  Demonstrated interest in learning. Expect positive outcomes  Education material provided: Living Well with Diabetes and A1C conversion sheet  If problems or questions, patient to contact team via:  Phone  Future DSME appointment: 4-6 wks

## 2017-03-25 NOTE — Patient Instructions (Addendum)
Plan:   Include protein with your meals and snacks  Aim to eat mindful and enjoy food and the eating experiencing. Aim for 20 min for meal.  Consider increasing your activity daily as tolerated  Consider checking BG a few time during the week 2 hours after a meal.   Continue taking medication as directed by MD  Continue doing things you like such as yard work as tolerated  Consider keeping glucose tabs in car to treat low-blood sugar

## 2017-05-06 ENCOUNTER — Ambulatory Visit: Payer: BLUE CROSS/BLUE SHIELD | Admitting: Registered"

## 2018-03-14 ENCOUNTER — Encounter: Payer: Self-pay | Admitting: Internal Medicine

## 2018-06-07 ENCOUNTER — Encounter: Payer: Self-pay | Admitting: Internal Medicine

## 2018-06-08 ENCOUNTER — Ambulatory Visit: Payer: BLUE CROSS/BLUE SHIELD | Admitting: Nurse Practitioner

## 2018-06-25 IMAGING — US US ABDOMEN COMPLETE
1 series · 14 of 25 positions shown · non-contrast
Comparison: CT abdomen/ pelvis dated 07/27/2010

CLINICAL DATA: Epigastric and left upper quadrant abdominal pain x1
week

EXAM:
ABDOMEN ULTRASOUND COMPLETE

[Series 1: us abdomen complete · 0.28mm/px · 14 of 71 slices shown]
[im 1/71]
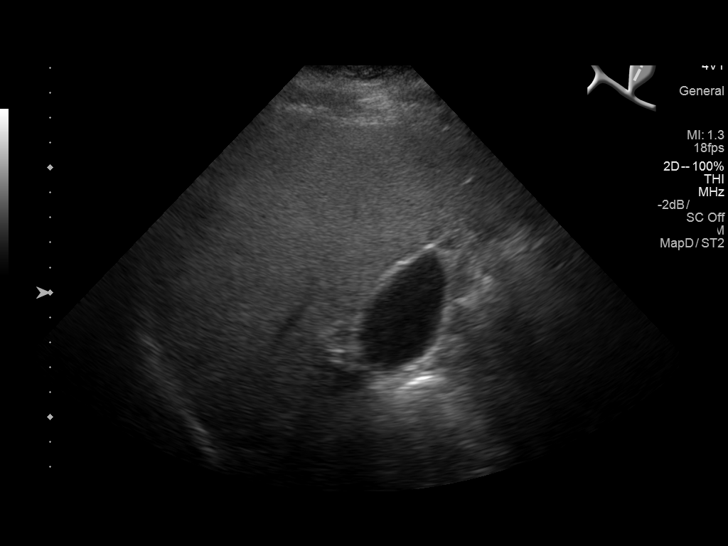
[im 6/71]
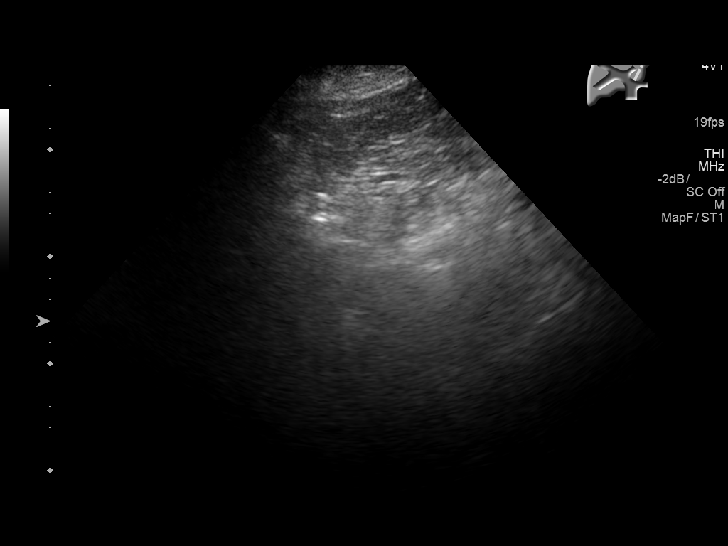
[im 12/71]
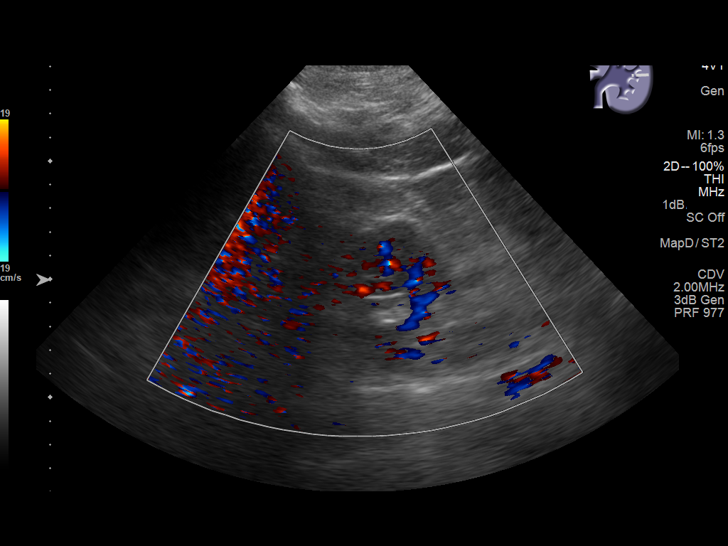
[im 18/71]
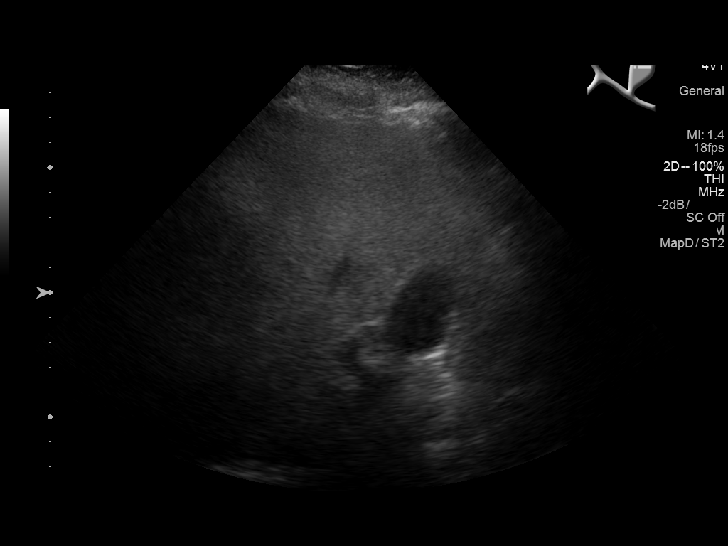
[im 24/71]
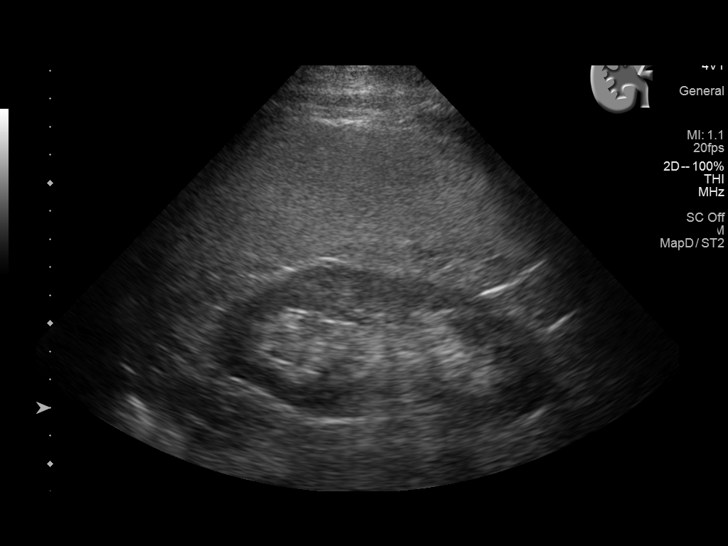
[im 27/71]
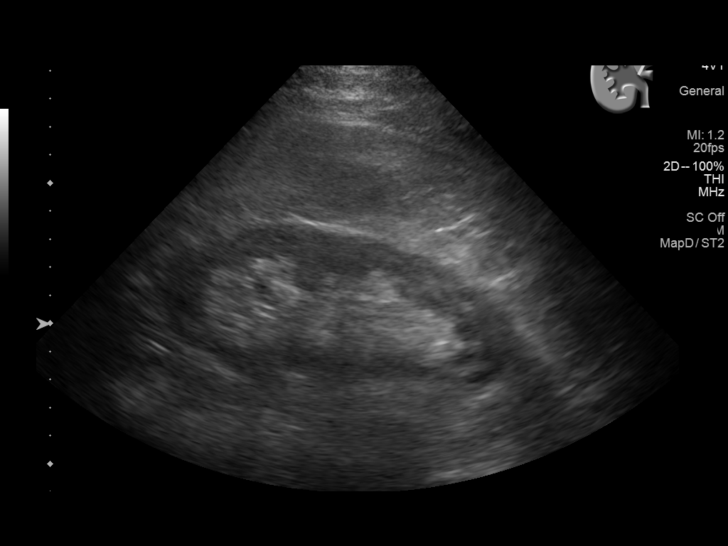
[im 33/71]
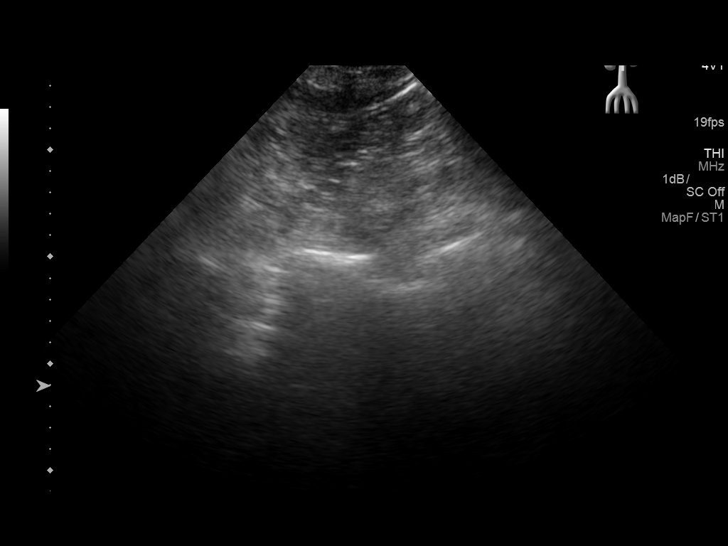
[im 38/71]
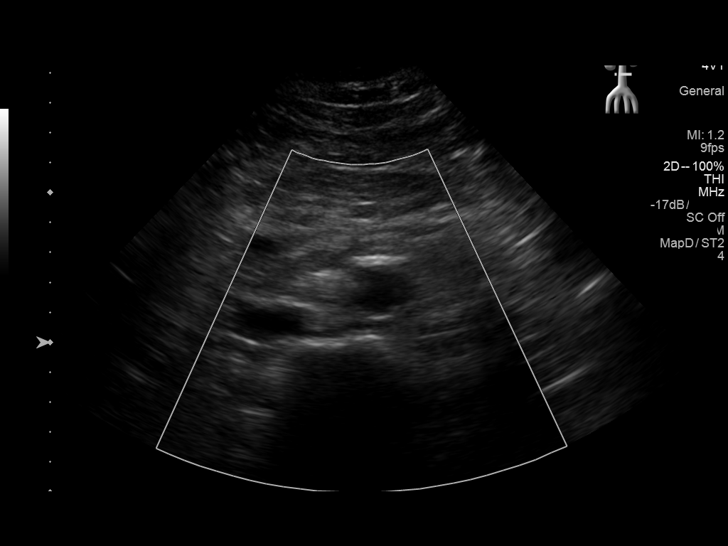
[im 44/71]
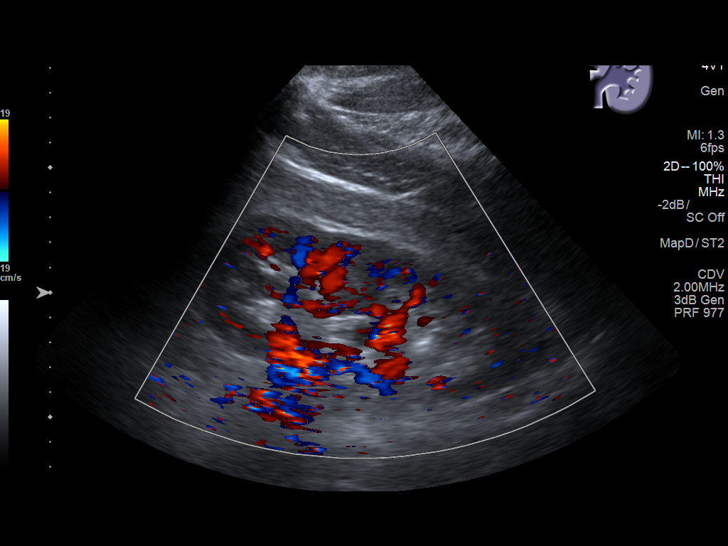
[im 47/71]
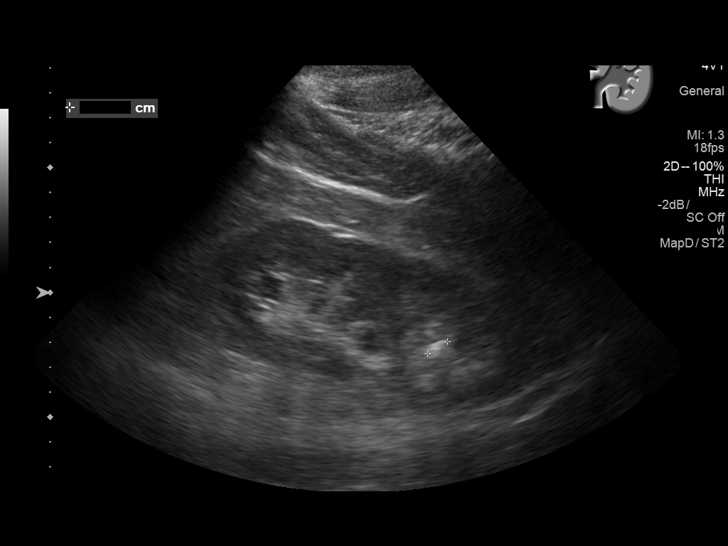
[im 53/71]
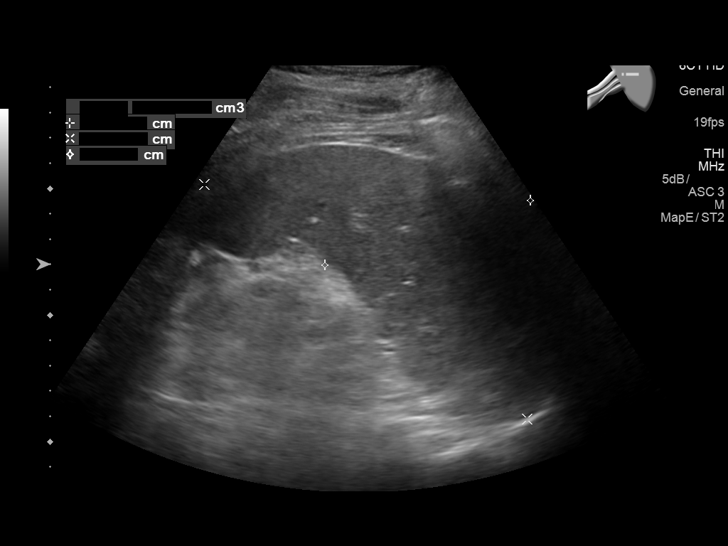
[im 59/71]
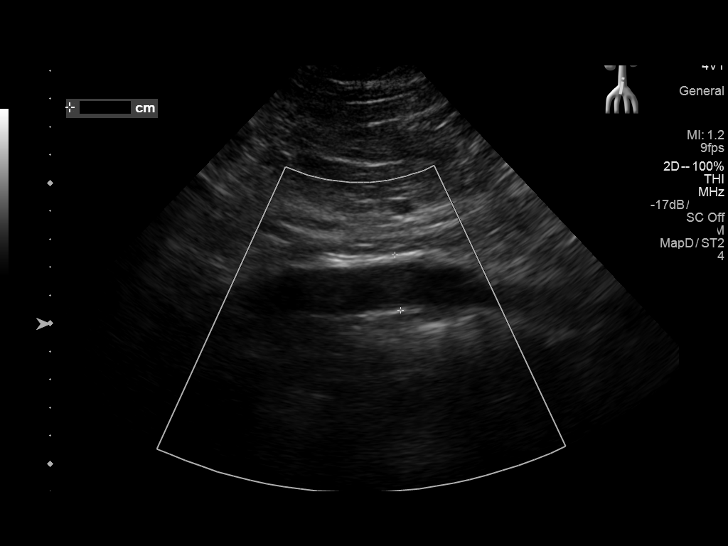
[im 65/71]
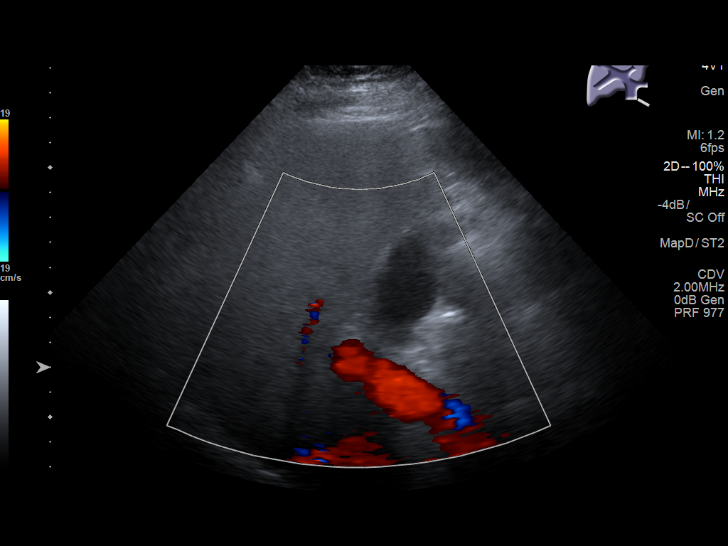
[im 71/71]
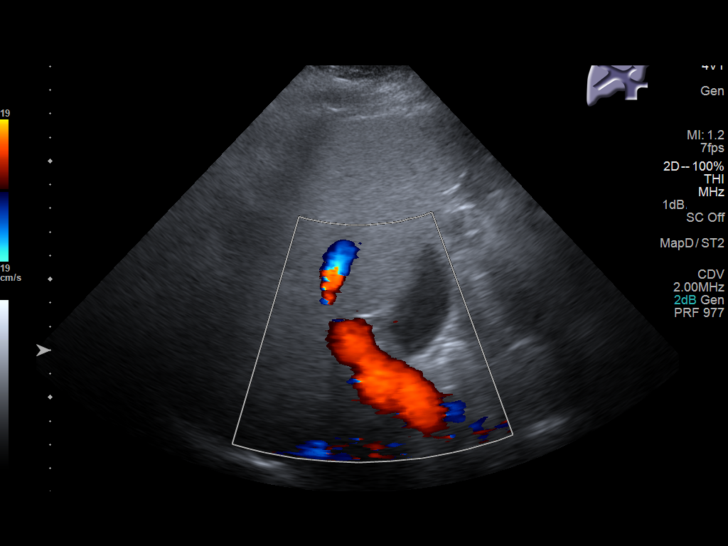

[14 of 25 positions shown; findings below may reference images not displayed]

FINDINGS: Gallbladder: No gallstones, gallbladder wall thickening, or
pericholecystic fluid. Negative sonographic Murphy's sign.

Common bile duct: Diameter: 4 mm

Liver: Hyperechoic hepatic parenchyma. No focal hepatic lesion is
seen. Incomplete visualization of left hepatic lobe due to bowel
gas.

IVC: Poorly visualized due to overlying bowel gas.

Pancreas: Not visualized due to overlying bowel gas.

Spleen: Splenomegaly, measuring 14.3 x 15.8 x 8.5 cm (calculated
volume 0770 mL).

Right Kidney: Length: 12.4 cm.  No mass or hydronephrosis.

Left Kidney: Length: 13.9 cm. 9 mm lower pole calculus. No
hydronephrosis.

Abdominal aorta: No aneurysm visualized.

Other findings: None.
IMPRESSION: Hyperechoic hepatic parenchyma, suggesting hepatic steatosis.

Splenomegaly, measuring up to 15.8 cm in max dimension (calculated
volume 0770 mL).

9 mm left lower pole renal calculus.  No hydronephrosis.

## 2018-09-12 ENCOUNTER — Ambulatory Visit: Payer: BLUE CROSS/BLUE SHIELD | Admitting: Nurse Practitioner

## 2021-09-04 DIAGNOSIS — I639 Cerebral infarction, unspecified: Secondary | ICD-10-CM

## 2021-09-04 HISTORY — DX: Cerebral infarction, unspecified: I63.9

## 2021-09-25 ENCOUNTER — Encounter (HOSPITAL_COMMUNITY): Payer: Self-pay

## 2021-09-25 ENCOUNTER — Emergency Department (HOSPITAL_COMMUNITY): Payer: BC Managed Care – PPO

## 2021-09-25 ENCOUNTER — Other Ambulatory Visit: Payer: Self-pay

## 2021-09-25 ENCOUNTER — Inpatient Hospital Stay (HOSPITAL_COMMUNITY)
Admission: EM | Admit: 2021-09-25 | Discharge: 2021-09-29 | DRG: 202 | Disposition: A | Discharge status: Home / Self Care | Payer: BC Managed Care – PPO | Attending: Internal Medicine | Admitting: Internal Medicine

## 2021-09-25 DIAGNOSIS — E119 Type 2 diabetes mellitus without complications: Secondary | ICD-10-CM

## 2021-09-25 DIAGNOSIS — Z9181 History of falling: Secondary | ICD-10-CM

## 2021-09-25 DIAGNOSIS — R6510 Systemic inflammatory response syndrome (SIRS) of non-infectious origin without acute organ dysfunction: Secondary | ICD-10-CM | POA: Diagnosis present

## 2021-09-25 DIAGNOSIS — J40 Bronchitis, not specified as acute or chronic: Secondary | ICD-10-CM | POA: Diagnosis not present

## 2021-09-25 DIAGNOSIS — E538 Deficiency of other specified B group vitamins: Secondary | ICD-10-CM | POA: Diagnosis present

## 2021-09-25 DIAGNOSIS — F419 Anxiety disorder, unspecified: Secondary | ICD-10-CM | POA: Diagnosis present

## 2021-09-25 DIAGNOSIS — M545 Low back pain, unspecified: Secondary | ICD-10-CM | POA: Diagnosis present

## 2021-09-25 DIAGNOSIS — Z794 Long term (current) use of insulin: Secondary | ICD-10-CM

## 2021-09-25 DIAGNOSIS — Z20822 Contact with and (suspected) exposure to covid-19: Secondary | ICD-10-CM | POA: Diagnosis present

## 2021-09-25 DIAGNOSIS — E876 Hypokalemia: Secondary | ICD-10-CM | POA: Diagnosis present

## 2021-09-25 DIAGNOSIS — E139 Other specified diabetes mellitus without complications: Secondary | ICD-10-CM | POA: Diagnosis not present

## 2021-09-25 DIAGNOSIS — F32A Depression, unspecified: Secondary | ICD-10-CM | POA: Diagnosis not present

## 2021-09-25 DIAGNOSIS — R509 Fever, unspecified: Secondary | ICD-10-CM | POA: Diagnosis present

## 2021-09-25 DIAGNOSIS — K219 Gastro-esophageal reflux disease without esophagitis: Secondary | ICD-10-CM | POA: Diagnosis present

## 2021-09-25 DIAGNOSIS — G8929 Other chronic pain: Secondary | ICD-10-CM | POA: Diagnosis present

## 2021-09-25 DIAGNOSIS — R4182 Altered mental status, unspecified: Secondary | ICD-10-CM

## 2021-09-25 DIAGNOSIS — E785 Hyperlipidemia, unspecified: Secondary | ICD-10-CM | POA: Diagnosis present

## 2021-09-25 DIAGNOSIS — R32 Unspecified urinary incontinence: Secondary | ICD-10-CM | POA: Diagnosis present

## 2021-09-25 DIAGNOSIS — Z87891 Personal history of nicotine dependence: Secondary | ICD-10-CM

## 2021-09-25 DIAGNOSIS — R7982 Elevated C-reactive protein (CRP): Secondary | ICD-10-CM | POA: Diagnosis present

## 2021-09-25 DIAGNOSIS — R531 Weakness: Secondary | ICD-10-CM

## 2021-09-25 DIAGNOSIS — R3589 Other polyuria: Secondary | ICD-10-CM | POA: Diagnosis present

## 2021-09-25 DIAGNOSIS — Z880 Allergy status to penicillin: Secondary | ICD-10-CM

## 2021-09-25 DIAGNOSIS — R700 Elevated erythrocyte sedimentation rate: Secondary | ICD-10-CM | POA: Diagnosis present

## 2021-09-25 DIAGNOSIS — Z7984 Long term (current) use of oral hypoglycemic drugs: Secondary | ICD-10-CM

## 2021-09-25 DIAGNOSIS — F05 Delirium due to known physiological condition: Secondary | ICD-10-CM | POA: Diagnosis present

## 2021-09-25 DIAGNOSIS — Z79899 Other long term (current) drug therapy: Secondary | ICD-10-CM

## 2021-09-25 DIAGNOSIS — U099 Post covid-19 condition, unspecified: Secondary | ICD-10-CM | POA: Diagnosis present

## 2021-09-25 DIAGNOSIS — E871 Hypo-osmolality and hyponatremia: Secondary | ICD-10-CM | POA: Diagnosis present

## 2021-09-25 DIAGNOSIS — I10 Essential (primary) hypertension: Secondary | ICD-10-CM

## 2021-09-25 LAB — CBC WITH DIFFERENTIAL/PLATELET
Abs Immature Granulocytes: 0.02 10*3/uL (ref 0.00–0.07)
Basophils Absolute: 0 10*3/uL (ref 0.0–0.1)
Basophils Relative: 0 %
Eosinophils Absolute: 0 10*3/uL (ref 0.0–0.5)
Eosinophils Relative: 0 %
HCT: 43.1 % (ref 39.0–52.0)
Hemoglobin: 14.6 g/dL (ref 13.0–17.0)
Immature Granulocytes: 0 %
Lymphocytes Relative: 10 %
Lymphs Abs: 0.5 10*3/uL — ABNORMAL LOW (ref 0.7–4.0)
MCH: 29.6 pg (ref 26.0–34.0)
MCHC: 33.9 g/dL (ref 30.0–36.0)
MCV: 87.2 fL (ref 80.0–100.0)
Monocytes Absolute: 0.3 10*3/uL (ref 0.1–1.0)
Monocytes Relative: 7 %
Neutro Abs: 4 10*3/uL (ref 1.7–7.7)
Neutrophils Relative %: 83 %
Platelets: 98 10*3/uL — ABNORMAL LOW (ref 150–400)
RBC: 4.94 MIL/uL (ref 4.22–5.81)
RDW: 14.1 % (ref 11.5–15.5)
WBC: 4.9 10*3/uL (ref 4.0–10.5)
nRBC: 0 % (ref 0.0–0.2)

## 2021-09-25 LAB — COMPREHENSIVE METABOLIC PANEL
ALT: 41 U/L (ref 0–44)
AST: 67 U/L — ABNORMAL HIGH (ref 15–41)
Albumin: 4 g/dL (ref 3.5–5.0)
Alkaline Phosphatase: 55 U/L (ref 38–126)
Anion gap: 12 (ref 5–15)
BUN: 14 mg/dL (ref 8–23)
CO2: 20 mmol/L — ABNORMAL LOW (ref 22–32)
Calcium: 8.9 mg/dL (ref 8.9–10.3)
Chloride: 96 mmol/L — ABNORMAL LOW (ref 98–111)
Creatinine, Ser: 1.1 mg/dL (ref 0.61–1.24)
GFR, Estimated: >60 mL/min (ref >60)
Glucose, Bld: 142 mg/dL — ABNORMAL HIGH (ref 70–99)
Potassium: 3 mmol/L — ABNORMAL LOW (ref 3.5–5.1)
Sodium: 128 mmol/L — ABNORMAL LOW (ref 135–145)
Total Bilirubin: 1.8 mg/dL — ABNORMAL HIGH (ref 0.3–1.2)
Total Protein: 7.4 g/dL (ref 6.5–8.1)

## 2021-09-25 LAB — URINALYSIS, ROUTINE W REFLEX MICROSCOPIC
Bilirubin Urine: NEGATIVE
Glucose, UA: >=500 mg/dL — AB
Ketones, ur: 15 mg/dL — AB
Leukocytes,Ua: NEGATIVE
Nitrite: NEGATIVE
Specific Gravity, Urine: 1.015 (ref 1.005–1.030)
pH: 5.5 (ref 5.0–8.0)

## 2021-09-25 LAB — URINALYSIS, MICROSCOPIC (REFLEX)

## 2021-09-25 LAB — PROTIME-INR
INR: 1.1 (ref 0.8–1.2)
Prothrombin Time: 14.4 seconds (ref 11.4–15.2)

## 2021-09-25 LAB — CBG MONITORING, ED: Glucose-Capillary: 142 mg/dL — ABNORMAL HIGH (ref 70–99)

## 2021-09-25 LAB — LACTIC ACID, PLASMA
Lactic Acid, Venous: 2.4 mmol/L (ref 0.5–1.9)
Lactic Acid, Venous: 2.6 mmol/L (ref 0.5–1.9)

## 2021-09-25 LAB — BRAIN NATRIURETIC PEPTIDE: B Natriuretic Peptide: 40 pg/mL (ref 0.0–100.0)

## 2021-09-25 LAB — RESP PANEL BY RT-PCR (FLU A&B, COVID) ARPGX2
Influenza A by PCR: NEGATIVE
Influenza B by PCR: NEGATIVE
SARS Coronavirus 2 by RT PCR: NEGATIVE

## 2021-09-25 LAB — APTT: aPTT: 27 seconds (ref 24–36)

## 2021-09-25 MED ORDER — LOSARTAN POTASSIUM 50 MG PO TABS
25.0000 mg | ORAL_TABLET | Freq: Every day | ORAL | Status: DC
  Administered 2021-09-26 – 2021-09-29 (×4): 25 mg via ORAL
  Filled 2021-09-25 (×4): qty 1

## 2021-09-25 MED ORDER — SODIUM CHLORIDE 0.9 % IV SOLN
2.0000 g | Freq: Once | INTRAVENOUS | Status: AC
  Administered 2021-09-25: 15:00:00 2 g via INTRAVENOUS
  Filled 2021-09-25: qty 2

## 2021-09-25 MED ORDER — ATENOLOL 25 MG PO TABS
25.0000 mg | ORAL_TABLET | Freq: Every day | ORAL | Status: DC
  Administered 2021-09-26 – 2021-09-29 (×4): 25 mg via ORAL
  Filled 2021-09-25 (×4): qty 1

## 2021-09-25 MED ORDER — OLANZAPINE 5 MG PO TABS
5.0000 mg | ORAL_TABLET | Freq: Every day | ORAL | Status: DC
  Administered 2021-09-26 – 2021-09-29 (×4): 5 mg via ORAL
  Filled 2021-09-25 (×4): qty 1

## 2021-09-25 MED ORDER — SODIUM CHLORIDE 0.9 % IV SOLN
2.0000 g | Freq: Three times a day (TID) | INTRAVENOUS | Status: DC
  Administered 2021-09-25 – 2021-09-27 (×5): 2 g via INTRAVENOUS
  Filled 2021-09-25 (×6): qty 2

## 2021-09-25 MED ORDER — HEPARIN SODIUM (PORCINE) 5000 UNIT/ML IJ SOLN
5000.0000 [IU] | Freq: Three times a day (TID) | INTRAMUSCULAR | Status: DC
  Administered 2021-09-25 – 2021-09-29 (×12): 5000 [IU] via SUBCUTANEOUS
  Filled 2021-09-25 (×11): qty 1

## 2021-09-25 MED ORDER — INSULIN ASPART 100 UNIT/ML IJ SOLN
0.0000 [IU] | Freq: Every day | INTRAMUSCULAR | Status: DC
  Administered 2021-09-27 – 2021-09-28 (×2): 2 [IU] via SUBCUTANEOUS

## 2021-09-25 MED ORDER — VANCOMYCIN HCL 1500 MG/300ML IV SOLN
1500.0000 mg | Freq: Once | INTRAVENOUS | Status: AC
  Administered 2021-09-25: 17:00:00 1500 mg via INTRAVENOUS
  Filled 2021-09-25: qty 300

## 2021-09-25 MED ORDER — PANTOPRAZOLE SODIUM 40 MG PO TBEC
40.0000 mg | DELAYED_RELEASE_TABLET | Freq: Two times a day (BID) | ORAL | Status: DC
  Administered 2021-09-25 – 2021-09-29 (×8): 40 mg via ORAL
  Filled 2021-09-25 (×8): qty 1

## 2021-09-25 MED ORDER — HYDROCHLOROTHIAZIDE 12.5 MG PO TABS: 12.5000 mg | ORAL_TABLET | Freq: Every day | ORAL | Status: DC

## 2021-09-25 MED ORDER — LACTATED RINGERS IV BOLUS (SEPSIS)
1000.0000 mL | Freq: Once | INTRAVENOUS | Status: AC
  Administered 2021-09-25: 15:00:00 1000 mL via INTRAVENOUS

## 2021-09-25 MED ORDER — VANCOMYCIN HCL 750 MG/150ML IV SOLN
750.0000 mg | Freq: Two times a day (BID) | INTRAVENOUS | Status: DC
  Administered 2021-09-26 – 2021-09-27 (×3): 750 mg via INTRAVENOUS
  Filled 2021-09-25 (×9): qty 150

## 2021-09-25 MED ORDER — POTASSIUM CHLORIDE CRYS ER 20 MEQ PO TBCR
40.0000 meq | EXTENDED_RELEASE_TABLET | Freq: Four times a day (QID) | ORAL | Status: AC
  Administered 2021-09-25 – 2021-09-26 (×2): 40 meq via ORAL
  Filled 2021-09-25 (×2): qty 2

## 2021-09-25 MED ORDER — LACTATED RINGERS IV BOLUS
1000.0000 mL | Freq: Once | INTRAVENOUS | Status: AC
  Administered 2021-09-25: 16:00:00 1000 mL via INTRAVENOUS

## 2021-09-25 MED ORDER — ALPRAZOLAM 0.5 MG PO TABS
0.5000 mg | ORAL_TABLET | Freq: Three times a day (TID) | ORAL | Status: DC | PRN
  Administered 2021-09-25 – 2021-09-26 (×2): 0.5 mg via ORAL
  Filled 2021-09-25 (×2): qty 1

## 2021-09-25 MED ORDER — METRONIDAZOLE 500 MG/100ML IV SOLN
500.0000 mg | Freq: Once | INTRAVENOUS | Status: AC
  Administered 2021-09-25: 16:00:00 500 mg via INTRAVENOUS
  Filled 2021-09-25: qty 100

## 2021-09-25 MED ORDER — INSULIN ASPART 100 UNIT/ML IJ SOLN
0.0000 [IU] | Freq: Three times a day (TID) | INTRAMUSCULAR | Status: DC
  Administered 2021-09-26: 17:00:00 3 [IU] via SUBCUTANEOUS
  Administered 2021-09-26: 13:00:00 2 [IU] via SUBCUTANEOUS
  Administered 2021-09-27 (×2): 5 [IU] via SUBCUTANEOUS
  Administered 2021-09-27 – 2021-09-28 (×2): 3 [IU] via SUBCUTANEOUS
  Administered 2021-09-28: 17:00:00 11 [IU] via SUBCUTANEOUS
  Administered 2021-09-28 – 2021-09-29 (×2): 5 [IU] via SUBCUTANEOUS
  Administered 2021-09-29: 09:00:00 2 [IU] via SUBCUTANEOUS
  Administered 2021-09-29: 17:00:00 5 [IU] via SUBCUTANEOUS

## 2021-09-25 MED ORDER — SODIUM CHLORIDE 0.9 % IV SOLN: INTRAVENOUS | Status: DC

## 2021-09-25 NOTE — ED Notes (Signed)
Phlebotomy at bedside collecting second set of blood cultures, labs and lactic have been taken to the the lab

## 2021-09-25 NOTE — ED Notes (Addendum)
Date and time results received: 09/25/21 3:18 PM   Test: Lactic Acid Critical Value: 2.4  Name of Provider Notified: Karie Mainland PA  Orders Received? Or Actions Taken?: see orders

## 2021-09-25 NOTE — ED Provider Notes (Addendum)
[facility] EMERGENCY DEPARTMENT Provider Note   CSN: [id number] Arrival date & time: 09/25/21  1308     History Chief Complaint  Patient presents with   Weakness    Malik Hill is a 60 y.o. male.  60 year old male with past medical history significant for depression, anxiety, hypertension, hyperlipidemia, diabetes, history of back fracture low back pain since presents today for evaluation of sudden onset of weakness and altered mental status since this morning.  Patient reports he woke up at 1:30 AM last night and has not been able to fall asleep since.  His daughter is at bedside who assists with history.  He was recently diagnosed with COVID 3 weeks ago and has done fairly well until today.  Per patient he reports he has been having worsening weakness over the past 2 weeks however daughter reports that this was sudden onset this morning and he had no prior symptoms or complaints until today.  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  Patient and daughter both report patient had 2 episodes of urine incontinence today.  Patient had 2 falls today first 1 requiring full assist from EMS, and second requiring presentation to the emergency room.  Daughter reports he is also confused.  On my exam patient is alert and oriented x3, he is unaware of today's date which daughter states he would normally know.  Daughter states he also told her that he fell at 4:00 this afternoon when currently it is 2 PM.  He is not on anticoagulation and denies prior history of CAD, CHF, or A. fib.  Denies loss of consciousness during falls today.   The history is provided by the patient. No language interpreter was used.      Past Medical History:  Diagnosis Date   Anxiety    Depression    Diabetes mellitus without complication ([place])     There are no problems to display for this patient.   Past Surgical History:  Procedure Laterality Date   COLONOSCOPY   06/09/2007   KNEE SURGERY Left    PENILE ADHESIONS LYSIS         History reviewed. No pertinent family history.  Social History   Tobacco Use   Smoking status: Former   Smokeless tobacco: Never  Substance Use Topics   Alcohol use: No   Drug use: No    Home Medications Prior to Admission medications   Medication Sig Start Date End Date Taking? Authorizing Provider  ALPRAZolam Duanne Moron) 0.5 MG tablet Take 0.5 mg by mouth 2 (two) times daily. 07/07/16   [provider]  atenolol (TENORMIN) 25 MG tablet Take by mouth daily.    [provider]  CARAFATE 1 GM/10ML suspension TAKE TWO TEASPOONSFUL (10ML) BY MOUTH THREE TIMES DAILY. 08/03/16   [provider]  clonazePAM (KLONOPIN) 1 MG tablet Take 1 mg by mouth 3 (three) times daily as needed. for anxiety 08/03/16   [provider]  cyclobenzaprine (FLEXERIL) 10 MG tablet TAKE 1/2 TO 1 TABLET BY MOUTH THREE TIMES DAILY AS NEEDED FOR PAIN 06/23/16   [provider]  hydrochlorothiazide (MICROZIDE) 12.5 MG capsule Take 12.5 mg by mouth daily. 06/09/16   [provider]  HYDROcodone-acetaminophen (NORCO) 7.5-325 MG per tablet Take 1 tablet by mouth every 6 (six) hours as needed for moderate pain. Patient not taking: Reported on 08/10/2016 03/20/14   Triplett, Tammy, PA-C  losartan (COZAAR) 25 MG tablet Take 25 mg by  mouth daily. 07/11/16   [provider]  metFORMIN (GLUCOPHAGE-XR) 500 MG 24 hr tablet TAKE 1 TABLET BY MOUTH DAILY 06/04/16   [provider]  naproxen (NAPROSYN) 500 MG tablet Take 1 tablet (500 mg total) by mouth 2 (two) times daily. Patient not taking: Reported on 08/10/2016 03/20/14   Triplett, Tammy, PA-C  OLANZAPINE-FLUOXETINE HCL PO Take by mouth.    [provider]  ondansetron (ZOFRAN ODT) 8 MG disintegrating tablet Take 1 tablet (8 mg total) by mouth every 8 (eight) hours as needed for nausea or vomiting. Patient not taking: Reported on 03/25/2017  08/10/16   Street, [place], PA-C  ondansetron (ZOFRAN-ODT) 8 MG disintegrating tablet ALLOW ONE TABLET TO DISSOLVE ON THE TONGUE EVERY 6 TO 8 HOURS PRN FOR NAUSEA OR VOMITING 07/07/16   [provider]  oxyCODONE-acetaminophen (PERCOCET/ROXICET) 5-325 MG tablet Take 1 tablet by mouth every 4 (four) hours as needed. for pain 06/23/16   [provider]  pantoprazole (PROTONIX) 40 MG tablet Take 40 mg by mouth 2 (two) times daily. 08/03/16   [provider]  PARoxetine HCl (PAXIL PO) Take by mouth.    [provider]  traMADol (ULTRAM) 50 MG tablet Take 50 mg by mouth 3 (three) times daily as needed. 05/06/16   [provider]  venlafaxine XR (EFFEXOR-XR) 150 MG 24 hr capsule Take 150 mg by mouth daily. 08/03/16   [provider]  venlafaxine XR (EFFEXOR-XR) 75 MG 24 hr capsule TAKE ONE CAPSULE BY MOUTH DAILY. TAKE WITH EFFEXOR 150MG  08/03/16   [provider]    Allergies    Penicillins  Review of Systems   Review of Systems  Constitutional:  Negative for chills and fever.  Eyes:  Negative for visual disturbance.  Respiratory:  Positive for shortness of breath.   Cardiovascular:  Negative for chest pain and palpitations.  Gastrointestinal:  Negative for abdominal pain, nausea and vomiting.  Genitourinary:  Negative for difficulty urinating and dysuria.  Neurological:  Positive for light-headedness and headaches. Negative for facial asymmetry and weakness.  All other systems reviewed and are negative.  Physical Exam Updated Vital Signs BP 127/67 (BP Location: Right Arm)    Pulse (!) 115    Temp 100.1 F (37.8 C) (Oral)    Resp (!) 22    Ht 6\' 5"  (1.956 m)    Wt 78.5 kg    SpO2 98%    BMI 20.51 kg/m   Physical Exam Vitals and nursing note reviewed.  Constitutional:      General: He is not in acute distress.    Appearance: Normal appearance. He is not ill-appearing.  HENT:     Head: Normocephalic and atraumatic.     Nose: Nose  normal.  Eyes:     General: No scleral icterus.    Extraocular Movements: Extraocular movements intact.     Conjunctiva/sclera: Conjunctivae normal.  Cardiovascular:     Rate and Rhythm: Regular rhythm. Tachycardia present.  Pulmonary:     Effort: Pulmonary effort is normal. No respiratory distress.     Breath sounds: Normal breath sounds. No wheezing or rales.  Abdominal:     General: There is no distension.     Palpations: Abdomen is soft.     Tenderness: There is no abdominal tenderness. There is no guarding.  Musculoskeletal:        General: Normal range of motion.     Cervical back: Normal range of motion.     Right lower leg: No  edema.     Left lower leg: No edema.     Comments: Patient with full range of motion in bilateral upper extremities and bilateral lower extremities.  Does have 5/5 strength bilateral lower and upper extremities.  Sensation in bilateral lower extremities intact.  2+ DP pulse bilaterally.   Skin:    General: Skin is warm and dry.  Neurological:     General: No focal deficit present.     Mental Status: He is alert. Mental status is at baseline.     Sensory: No sensory deficit.     Motor: No weakness.    ED Results / Procedures / Treatments   Labs (all labs ordered are listed, but only abnormal results are displayed) Labs Reviewed - No data to display  EKG None  Radiology No results found.  Procedures Procedures   Medications Ordered in ED Medications - No data to display  ED Course  I have reviewed the triage vital signs and the nursing notes.  Pertinent labs & imaging results that were available during my care of the patient were reviewed by me and considered in my medical decision making (see chart for details).    MDM Rules/Calculators/A&P                         60 year old male presents today for evaluation of generalized weakness, multiple falls, and altered mental status onset this morning.  Daughter is at bedside.  Patient  alert and oriented x3.  In addition to above symptoms he does endorse lightheadedness, shortness of breath.  We will start sepsis protocol and obtain CT head.  Work-up significant for lactic acidosis, CBC with thrombocytopenia at 98 otherwise unremarkable, CMP with hyponatremia at 128, hypokalemia 3.0 hypochloremia at 96, glucose of 142, mild hyperbilirubinemia at 1.8, AST of 67 otherwise unremarkable.  Respiratory panel negative for COVID and flu.  BNP of 40.  Blood cultures collected and pending.  Broad spectrum antibiotics ordered.  UA without UTI.  Given work-up so far no apparent source has been identified for patient's symptomatology.  Given back pain, weakness lumbar and thoracic spine MRI ordered.  Patient despite getting 2 L lactated ringer boluses remains tachycardic.  Initial temperature on arrival 100 point 1 repeat temperature 100.0 at 1830.  Case discussed with hospitalist who will evaluate patient for admission.     Final Clinical Impression(s) / ED Diagnoses Final diagnoses:  Weakness  Altered mental status, unspecified altered mental status type    Rx / DC Orders ED Discharge Orders     None        Marita Kansas, PA-C 09/25/21 1918    Marita Kansas, PA-C 09/25/21 1920    Cheryll Cockayne, MD 10/02/21 2035

## 2021-09-25 NOTE — Progress Notes (Signed)
Pharmacy Antibiotic Note  Malik Hill is a 60 y.o. male admitted on 09/25/2021 with sepsis.  Pharmacy has been consulted for Vancomycin and cefepime dosing.  Plan: Vancomycin 1500mg  lV loading dose then 1000 mg IV Q 12 hrs. Goal AUC 400-550. Expected AUC: 424 SCr used: 1.1  Cefepime 2gm IV q8h F/U cxs and clinical progress Monitor V/S, labs and levels as indicated  Height: 6\' 5"  (195.6 cm) Weight: 78.5 kg (173 lb) IBW/kg (Calculated) : 89.1  Temp (24hrs), Avg:100.1 F (37.8 C), Min:100.1 F (37.8 C), Max:100.1 F (37.8 C)  Recent Labs  Lab 09/25/21 1452  WBC 4.9  CREATININE 1.10  LATICACIDVEN 2.4*    Estimated Creatinine Clearance: 78.3 mL/min (by C-G formula based on SCr of 1.1 mg/dL).    Allergies  Allergen Reactions   Penicillins Rash    Antimicrobials this admission: Vancomycin 12/22 >>  Cefepime 12/22 >>  Flagyl 12/22  Microbiology results: 12/22 BCx: pending 12/22 UCx: pending   MRSA PCR:   Thank you for allowing pharmacy to be a part of this patients care.  1/23, BS 1/23, Malik Hill Clinical Pharmacist Pager [phone] 09/25/2021 3:43 PM

## 2021-09-25 NOTE — H&P (Signed)
We will                                                                                                          TRH H&P   Patient Demographics:    Essie Gehret, is a 60 y.o. male  MRN: [id number]   DOB - 01-07-1961  Admit Date - 09/25/2021  Outpatient Primary MD for the patient is Sasser, Clarene Critchley, MD  Referring MD/NP/PA: Doneta Public  Patient coming from: home  Chief Complaint  Patient presents with   Weakness      HPI:    Quaid Yeakle  is a 60 y.o. male, with past medical history for depression, anxiety, hypertension, hyperlipidemia, diabetes mellitus, history of chronic lower back pain/back injury, he presents to ED secondary to episode of generalized weakness, and altered mental status this morning, reported having lack of energy for years, but he is still active, working a full-time job as a Control and instrumentation engineer, patient reports he was recently diagnosed with COVID 3 weeks ago, and he has been doing fairly well until today, he reports woke up this morning, feeling weak, having some fever and chills, reports unable to stand from recliner where he helped himself to the floor couple times and able to get back up on his own, he does report some urine incontinence, but reports this is intermittent problem over the month as he is on hydrochlorothiazide, he denies cough, dysuria (but reports polyuria), no nausea, no vomiting, no chest pain. In ED his work-up significant for hyponatremia of sodium of 128, hypokalemia potassium of 3, and elevated lactic acid at 2.4, white blood cell within normal limit, platelets borderline at 98K, T-max 100.1 in ED,MRI of thoracolumbar spine with no evidence of infectious etiology, CT head nonacute, chest x-ray with no acute findings, [facility] consulted to admit.    Review of systems:    In addition to the HPI above,  Reports fever and chills at home No Headache, No changes with Vision or hearing, No problems swallowing food or Liquids, No Chest  pain, Cough or Shortness of Breath, No Abdominal pain, No Nausea or Vommitting, Bowel movements are regular, No Blood in stool or Urine, Reports incontinence No new skin rashes or bruises, No new joints pains-aches,  Reports generalized weakness No recent weight gain or loss, No polyuria, polydypsia or polyphagia, No significant Mental Stressors.  A full 10 point Review of Systems was done, except as stated above, all other Review of Systems were negative.   With Past History of the following :    Past Medical History:  Diagnosis Date   Anxiety    Depression    Diabetes mellitus without complication (HCC)       Past Surgical History:  Procedure Laterality Date   COLONOSCOPY  06/09/2007   KNEE SURGERY Left    PENILE ADHESIONS LYSIS        Social History:     Social History   Tobacco Use   Smoking status: Former   Smokeless tobacco: Never  Substance Use Topics  Alcohol use: No        Family History :    History reviewed. No pertinent family history.    Home Medications:   Prior to Admission medications   Medication Sig Start Date End Date Taking? Authorizing Provider  ALPRAZolam Prudy Feeler) 1 MG tablet Take 1 mg by mouth 4 (four) times daily as needed. 09/19/21  Yes [provider]  atenolol (TENORMIN) 25 MG tablet Take by mouth daily.   Yes [provider]  clonazePAM (KLONOPIN) 1 MG tablet Take 1 mg by mouth 3 (three) times daily as needed. for anxiety 08/03/16  Yes [provider]  glipiZIDE (GLUCOTROL XL) 10 MG 24 hr tablet Take 10 mg by mouth 2 (two) times daily. 09/18/21  Yes [provider]  hydrochlorothiazide (MICROZIDE) 12.5 MG capsule Take 12.5 mg by mouth daily. 06/09/16  Yes [provider]  JARDIANCE 25 MG TABS tablet Take 25 mg by mouth daily. 09/18/21  Yes [provider]  losartan (COZAAR) 25 MG tablet Take 25 mg by mouth daily. 07/11/16  Yes [provider]  metFORMIN (GLUCOPHAGE-XR)  500 MG 24 hr tablet TAKE 1 TABLET BY MOUTH DAILY 06/04/16  Yes [provider]  OLANZapine (ZYPREXA) 5 MG tablet Take 5 mg by mouth daily. 09/01/21  Yes [provider]  pantoprazole (PROTONIX) 40 MG tablet Take 40 mg by mouth 2 (two) times daily. 08/03/16  Yes [provider]  PARoxetine HCl (PAXIL PO) Take 1 tablet by mouth daily.   Yes [provider]  traZODone (DESYREL) 100 MG tablet Take 100 mg by mouth daily. 09/18/21  Yes [provider]  TRULICITY 1.5 MG/0.5ML SOPN Inject 1.5 mg into the skin once a week. 09/18/21  Yes [provider]  ALPRAZolam Prudy Feeler) 0.5 MG tablet Take 0.5 mg by mouth 3 (three) times daily. Patient not taking: Reported on 09/25/2021 07/07/16   [provider]  CARAFATE 1 GM/10ML suspension TAKE TWO TEASPOONSFUL ( ) BY MOUTH THREE TIMES DAILY. Patient not taking: Reported on 09/25/2021 08/03/16   [provider]  cyclobenzaprine (FLEXERIL) 10 MG tablet TAKE 1/2 TO 1 TABLET BY MOUTH THREE TIMES DAILY AS NEEDED FOR PAIN Patient not taking: Reported on 09/25/2021 06/23/16   [provider]  HYDROcodone-acetaminophen (NORCO) 7.5-325 MG per tablet Take 1 tablet by mouth every 6 (six) hours as needed for moderate pain. Patient not taking: Reported on 08/10/2016 03/20/14   Triplett, Tammy, PA-C  naproxen (NAPROSYN) 500 MG tablet Take 1 tablet (500 mg total) by mouth 2 (two) times daily. Patient not taking: Reported on 08/10/2016 03/20/14   Triplett, Tammy, PA-C  OLANZAPINE-FLUOXETINE HCL PO Take by mouth. Patient not taking: Reported on 09/25/2021    [provider]  ondansetron (ZOFRAN ODT) 8 MG disintegrating tablet Take 1 tablet (8 mg total) by mouth every 8 (eight) hours as needed for nausea or vomiting. Patient not taking: Reported on 03/25/2017 08/10/16   Street, [place], PA-C  ondansetron (ZOFRAN-ODT) 8 MG disintegrating tablet ALLOW ONE TABLET TO DISSOLVE ON THE TONGUE EVERY 6 TO 8  HOURS PRN FOR NAUSEA OR VOMITING 07/07/16   [provider]  oxyCODONE-acetaminophen (PERCOCET/ROXICET) 5-325 MG tablet Take 1 tablet by mouth every 4 (four) hours as needed. for pain Patient not taking: Reported on 09/25/2021 06/23/16   [provider]  PARoxetine (PAXIL) 30 MG tablet Take 60 mg by mouth daily. 07/28/21   [provider]  traMADol (ULTRAM) 50 MG tablet Take 50 mg by mouth 3 (three)  times daily as needed. Patient not taking: Reported on 09/25/2021 05/06/16   [provider]  venlafaxine XR (EFFEXOR-XR) 150 MG 24 hr capsule Take 150 mg by mouth daily. Patient not taking: Reported on 09/25/2021 08/03/16   [provider]  venlafaxine XR (EFFEXOR-XR) 75 MG 24 hr capsule TAKE ONE CAPSULE BY MOUTH DAILY. TAKE WITH EFFEXOR  Patient not taking: Reported on 09/25/2021 08/03/16   [provider]     Allergies:     Allergies  Allergen Reactions   Penicillins Rash     Physical Exam:   Vitals  Blood pressure 118/79, pulse (!) 109, temperature 100.1 F (37.8 C), temperature source Oral, resp. rate (!) 23, height  (1.956 m), weight 78.5 kg, SpO2 91 %.   1. General well developed male, laying in bed in no apparent distress  2. Normal affect and insight, Not Suicidal or Homicidal, Awake Alert, Oriented X 3.  3. No F.N deficits, ALL C.Nerves Intact, Strength 5/5 all 4 extremities, Sensation intact all 4 extremities, Plantars down going.  4. Ears and Eyes appear Normal, Conjunctivae clear, PERRLA. Moist Oral Mucosa.  5. Supple Neck, No JVD, No cervical lymphadenopathy appriciated, No Carotid Bruits.  6. Symmetrical Chest wall movement, Good air movement bilaterally, CTAB.  7.  Tachycardic, No Gallops, Rubs or Murmurs, No Parasternal Heave.  8. Positive Bowel Sounds, Abdomen Soft, No tenderness, No organomegaly appriciated,No rebound -guarding or rigidity.  9.  No Cyanosis, Normal Skin Turgor, No Skin Rash or  Bruise.  10. Good muscle tone,  joints appear normal , no effusions, Normal ROM.  11. No Palpable Lymph Nodes in Neck or Axillae     Data Review:    CBC Recent Labs  Lab 09/25/21 1452  WBC 4.9  HGB 14.6  HCT 43.1  PLT 98*  MCV 87.2  MCH 29.6  MCHC 33.9  RDW 14.1  LYMPHSABS 0.5*  MONOABS 0.3  EOSABS 0.0  BASOSABS 0.0   ------------------------------------------------------------------------------------------------------------------  Chemistries  Recent Labs  Lab 09/25/21 1452  NA 128*  K 3.0*  CL 96*  CO2 20*  GLUCOSE 142*  BUN 14  CREATININE 1.10  CALCIUM 8.9  AST 67*  ALT 41  ALKPHOS 55  BILITOT 1.8*   ------------------------------------------------------------------------------------------------------------------ estimated creatinine clearance is 78.3 mL/min (by C-G formula based on SCr of 1.1 mg/dL). ------------------------------------------------------------------------------------------------------------------ No results for input(s): TSH, T4TOTAL, T3FREE, THYROIDAB in the last 72 hours.  Invalid input(s): FREET3  Coagulation profile Recent Labs  Lab 09/25/21 1452  INR 1.1   ------------------------------------------------------------------------------------------------------------------- No results for input(s): DDIMER in the last 72 hours. -------------------------------------------------------------------------------------------------------------------  Cardiac Enzymes No results for input(s): CKMB, TROPONINI, MYOGLOBIN in the last 168 hours.  Invalid input(s): CK ------------------------------------------------------------------------------------------------------------------    Component Value Date/Time   BNP 40.0 09/25/2021 1455     ---------------------------------------------------------------------------------------------------------------  Urinalysis    Component Value Date/Time   COLORURINE YELLOW 09/25/2021 1411    APPEARANCEUR CLEAR 09/25/2021 1411   LABSPEC 1.015 09/25/2021 1411   PHURINE 5.5 09/25/2021 1411   GLUCOSEU >=500 (A) 09/25/2021 1411   HGBUR LARGE (A) 09/25/2021 1411   BILIRUBINUR NEGATIVE 09/25/2021 1411   KETONESUR 15 (A) 09/25/2021 1411   PROTEINUR TRACE (A) 09/25/2021 1411   UROBILINOGEN 0.2 02/17/2008 1558   NITRITE NEGATIVE 09/25/2021 1411   LEUKOCYTESUR NEGATIVE 09/25/2021 1411    ----------------------------------------------------------------------------------------------------------------   Imaging Results:    CT Head Wo Contrast  Result Date: 09/25/2021 CLINICAL DATA:  Headache EXAM: CT HEAD WITHOUT CONTRAST TECHNIQUE: Contiguous axial images were  obtained from the base of the skull through the vertex without intravenous contrast. COMPARISON:  None. FINDINGS: Brain: No acute intracranial hemorrhage, mass effect, or herniation. No extra-axial fluid collections. No evidence of acute territorial infarct. No hydrocephalus. Vascular: No hyperdense vessel or unexpected calcification. Skull: Normal. Negative for fracture or focal lesion. Sinuses/Orbits: No acute finding. Other: None. IMPRESSION: No acute intracranial process identified. Electronically Signed   By: Jannifer Hick M.D.   On: 09/25/2021 15:20   MR THORACIC SPINE WO CONTRAST  Result Date: 09/25/2021 CLINICAL DATA:  Back pain. Rule out fracture. Rule out epidural abscess. EXAM: MRI THORACIC AND LUMBAR SPINE WITHOUT CONTRAST TECHNIQUE: Multiplanar and multiecho pulse sequences of the thoracic and lumbar spine were obtained without intravenous contrast. COMPARISON:  None FINDINGS: MRI THORACIC SPINE FINDINGS Alignment:  Normal Vertebrae: Negative for fracture, mass, or spinal infection Cord: Cord deformity due to disc protrusions. Spinal cord signal normal throughout. Paraspinal and other soft tissues: Negative for paraspinous mass or adenopathy. No pleural effusion. Disc levels: T1-2: Central disc protrusion without  stenosis T2-3: Negative T3-4: Mild disc degeneration.  Negative for stenosis. T4-5: Negative T5-6: Shallow central disc protrusion with cord flattening. No significant stenosis T6-7: Left-sided disc protrusion with mild left foraminal narrowing. T7-8: Moderately large right paracentral disc protrusion and spurring with moderate cord flattening ventrally on the right. Adequate CSF posterior to the cord T8-9: Mild to moderate left-sided disc protrusion without significant stenosis T9-10: Large extruded disc fragment in the midline. There is cord flattening with moderate spinal stenosis. Cord signal normal. T10-11: Negative T11-12: Negative MRI LUMBAR SPINE FINDINGS Segmentation:  5 lumbar segments Alignment:  Normal Vertebrae: Mild chronic compression fracture L1. No acute fracture or mass. Negative for spinal infection Conus medullaris and cauda equina: Conus extends to the L1 level. Conus and cauda equina appear normal. Paraspinal and other soft tissues: Negative for paraspinous mass or adenopathy. Disc levels: L1-2: Mild disc and facet degeneration.  Negative for stenosis L2-3: Negative L3-4: Mild disc and mild facet degeneration. Shallow left foraminal disc protrusion. Mild left foraminal narrowing. Central canal patent. L4-5: Disc bulging and small central disc protrusion. Bilateral facet degeneration. No significant stenosis. L5-S1: Moderately large central disc protrusion. L5 nerve roots exit freely. Disc protrusion is touching the S1 nerve roots bilaterally without compression. IMPRESSION: MR THORACIC SPINE IMPRESSION 1. Thoracic multilevel disc degeneration. Multi level disc protrusions. Largest disc protrusion is at T9-10 causing cord flattening and moderate spinal stenosis. 2. Negative for thoracic infection MR LUMBAR SPINE IMPRESSION 1. Negative for lumbar infection.  Chronic fracture L1 2. Shallow left foraminal disc protrusion L3-4. Central disc protrusion L5-S1 touching the S1 nerve roots but not  causing nerve root compression. Electronically Signed   By: Marlan Palau M.D.   On: 09/25/2021 19:52   MR LUMBAR SPINE WO CONTRAST  Result Date: 09/25/2021 CLINICAL DATA:  Back pain. Rule out fracture. Rule out epidural abscess. EXAM: MRI THORACIC AND LUMBAR SPINE WITHOUT CONTRAST TECHNIQUE: Multiplanar and multiecho pulse sequences of the thoracic and lumbar spine were obtained without intravenous contrast. COMPARISON:  None FINDINGS: MRI THORACIC SPINE FINDINGS Alignment:  Normal Vertebrae: Negative for fracture, mass, or spinal infection Cord: Cord deformity due to disc protrusions. Spinal cord signal normal throughout. Paraspinal and other soft tissues: Negative for paraspinous mass or adenopathy. No pleural effusion. Disc levels: T1-2: Central disc protrusion without stenosis T2-3: Negative T3-4: Mild disc degeneration.  Negative for stenosis. T4-5: Negative T5-6: Shallow central disc protrusion with cord flattening. No significant stenosis T6-7:  Left-sided disc protrusion with mild left foraminal narrowing. T7-8: Moderately large right paracentral disc protrusion and spurring with moderate cord flattening ventrally on the right. Adequate CSF posterior to the cord T8-9: Mild to moderate left-sided disc protrusion without significant stenosis T9-10: Large extruded disc fragment in the midline. There is cord flattening with moderate spinal stenosis. Cord signal normal. T10-11: Negative T11-12: Negative MRI LUMBAR SPINE FINDINGS Segmentation:  5 lumbar segments Alignment:  Normal Vertebrae: Mild chronic compression fracture L1. No acute fracture or mass. Negative for spinal infection Conus medullaris and cauda equina: Conus extends to the L1 level. Conus and cauda equina appear normal. Paraspinal and other soft tissues: Negative for paraspinous mass or adenopathy. Disc levels: L1-2: Mild disc and facet degeneration.  Negative for stenosis L2-3: Negative L3-4: Mild disc and mild facet degeneration. Shallow  left foraminal disc protrusion. Mild left foraminal narrowing. Central canal patent. L4-5: Disc bulging and small central disc protrusion. Bilateral facet degeneration. No significant stenosis. L5-S1: Moderately large central disc protrusion. L5 nerve roots exit freely. Disc protrusion is touching the S1 nerve roots bilaterally without compression. IMPRESSION: MR THORACIC SPINE IMPRESSION 1. Thoracic multilevel disc degeneration. Multi level disc protrusions. Largest disc protrusion is at T9-10 causing cord flattening and moderate spinal stenosis. 2. Negative for thoracic infection MR LUMBAR SPINE IMPRESSION 1. Negative for lumbar infection.  Chronic fracture L1 2. Shallow left foraminal disc protrusion L3-4. Central disc protrusion L5-S1 touching the S1 nerve roots but not causing nerve root compression. Electronically Signed   By: Marlan Palau M.D.   On: 09/25/2021 19:52   DG Chest Port 1 View  Result Date: 09/25/2021 CLINICAL DATA:  Sepsis EXAM: PORTABLE CHEST 1 VIEW COMPARISON:  Chest x-ray 02/17/2008 FINDINGS: Heart size appears normal. Mediastinum is stable. No focal consolidation identified in the lungs. No pleural effusion or pneumothorax. IMPRESSION: No acute intrathoracic process identified. Electronically Signed   By: Jannifer Hick M.D.   On: 09/25/2021 14:39    My personal review of EKG: EKG is pending    Assessment & Plan:    Principal Problem:   SIRS (systemic inflammatory response syndrome) (HCC) Active Problems:   DM (diabetes mellitus) (HCC)   Essential hypertension   Depression   SIRS -Presents with fever, has elevated lactic acid, so far no infectious etiology could be identified, negative UA, and chest x-ray, as were sent, lactic acid trending up, so he will be admitted for observation overnight, I will keep him on broad-spectrum antibiotics and follow on blood cultures..  Diabetes mellitus -Hold oral hypoglycemic agent including Jardiance, metformin and glipizide,  Trulicity as well, and will keep on insulin sliding scale during hospital stay.  Anxiety/depression -Patient on multiple medications, I will hold trazodone, Klonopin, and Paxil, will resume on Zyprexa and Xanax for now.  Hypertension - resume atenolol, and losartan, will hold HCTZ given, hyponatrmia  Hyponatremia - will start on gentle IV hydration - will hold HCTZ  Hypokalemia - will replete  GERD - on PPI  Generalized weakness -We will consult PT/OT.   DVT Prophylaxis Heparin   AM Labs Ordered, also please review Full Orders  Family Communication: Admission, patients condition and plan of care including tests being ordered have been discussed with the patient who indicate understanding and agree with the plan and Code Status.  Code Status Full  Likely DC to  home  Condition GUARDED    Consults called: none    Admission status: observation    Time spent in minutes : 55 minutes  Huey Bienenstock M.D on 09/25/2021 at 9:10 PM   [facility] - Office  [phone]

## 2021-09-25 NOTE — ED Triage Notes (Signed)
Patient reports generalized weakness for the past two months that has worsened the past two days. Patient reports multiple falls since last night with EMS called to house multiple times due to patient not being able to get off floor. Denies pain. Febrile in ED.

## 2021-09-26 DIAGNOSIS — F419 Anxiety disorder, unspecified: Secondary | ICD-10-CM | POA: Diagnosis present

## 2021-09-26 DIAGNOSIS — E119 Type 2 diabetes mellitus without complications: Secondary | ICD-10-CM | POA: Diagnosis present

## 2021-09-26 DIAGNOSIS — Z20822 Contact with and (suspected) exposure to covid-19: Secondary | ICD-10-CM | POA: Diagnosis present

## 2021-09-26 DIAGNOSIS — E139 Other specified diabetes mellitus without complications: Secondary | ICD-10-CM | POA: Diagnosis not present

## 2021-09-26 DIAGNOSIS — Z79899 Other long term (current) drug therapy: Secondary | ICD-10-CM | POA: Diagnosis not present

## 2021-09-26 DIAGNOSIS — Z880 Allergy status to penicillin: Secondary | ICD-10-CM | POA: Diagnosis not present

## 2021-09-26 DIAGNOSIS — R32 Unspecified urinary incontinence: Secondary | ICD-10-CM | POA: Diagnosis present

## 2021-09-26 DIAGNOSIS — R7982 Elevated C-reactive protein (CRP): Secondary | ICD-10-CM | POA: Diagnosis present

## 2021-09-26 DIAGNOSIS — R6510 Systemic inflammatory response syndrome (SIRS) of non-infectious origin without acute organ dysfunction: Secondary | ICD-10-CM | POA: Diagnosis present

## 2021-09-26 DIAGNOSIS — U099 Post covid-19 condition, unspecified: Secondary | ICD-10-CM | POA: Diagnosis present

## 2021-09-26 DIAGNOSIS — Z794 Long term (current) use of insulin: Secondary | ICD-10-CM | POA: Diagnosis not present

## 2021-09-26 DIAGNOSIS — I10 Essential (primary) hypertension: Secondary | ICD-10-CM | POA: Diagnosis present

## 2021-09-26 DIAGNOSIS — Z9181 History of falling: Secondary | ICD-10-CM | POA: Diagnosis not present

## 2021-09-26 DIAGNOSIS — F32A Depression, unspecified: Secondary | ICD-10-CM | POA: Diagnosis present

## 2021-09-26 DIAGNOSIS — J40 Bronchitis, not specified as acute or chronic: Secondary | ICD-10-CM | POA: Diagnosis present

## 2021-09-26 DIAGNOSIS — R700 Elevated erythrocyte sedimentation rate: Secondary | ICD-10-CM | POA: Diagnosis present

## 2021-09-26 DIAGNOSIS — Z7984 Long term (current) use of oral hypoglycemic drugs: Secondary | ICD-10-CM | POA: Diagnosis not present

## 2021-09-26 DIAGNOSIS — F05 Delirium due to known physiological condition: Secondary | ICD-10-CM | POA: Diagnosis present

## 2021-09-26 DIAGNOSIS — E538 Deficiency of other specified B group vitamins: Secondary | ICD-10-CM | POA: Diagnosis present

## 2021-09-26 DIAGNOSIS — K219 Gastro-esophageal reflux disease without esophagitis: Secondary | ICD-10-CM | POA: Diagnosis present

## 2021-09-26 DIAGNOSIS — R531 Weakness: Secondary | ICD-10-CM | POA: Diagnosis not present

## 2021-09-26 DIAGNOSIS — Z87891 Personal history of nicotine dependence: Secondary | ICD-10-CM | POA: Diagnosis not present

## 2021-09-26 DIAGNOSIS — E785 Hyperlipidemia, unspecified: Secondary | ICD-10-CM | POA: Diagnosis present

## 2021-09-26 DIAGNOSIS — E876 Hypokalemia: Secondary | ICD-10-CM | POA: Diagnosis present

## 2021-09-26 DIAGNOSIS — E871 Hypo-osmolality and hyponatremia: Secondary | ICD-10-CM | POA: Diagnosis present

## 2021-09-26 DIAGNOSIS — R509 Fever, unspecified: Secondary | ICD-10-CM | POA: Diagnosis present

## 2021-09-26 LAB — GLUCOSE, CAPILLARY
Glucose-Capillary: 150 mg/dL — ABNORMAL HIGH (ref 70–99)
Glucose-Capillary: 192 mg/dL — ABNORMAL HIGH (ref 70–99)
Glucose-Capillary: 172 mg/dL — ABNORMAL HIGH (ref 70–99)

## 2021-09-26 LAB — BASIC METABOLIC PANEL
Anion gap: 10 (ref 5–15)
BUN: 13 mg/dL (ref 8–23)
CO2: 22 mmol/L (ref 22–32)
Calcium: 8.5 mg/dL — ABNORMAL LOW (ref 8.9–10.3)
Chloride: 101 mmol/L (ref 98–111)
Creatinine, Ser: 1.07 mg/dL (ref 0.61–1.24)
GFR, Estimated: >60 mL/min (ref >60)
Glucose, Bld: 105 mg/dL — ABNORMAL HIGH (ref 70–99)
Potassium: 3.3 mmol/L — ABNORMAL LOW (ref 3.5–5.1)
Sodium: 133 mmol/L — ABNORMAL LOW (ref 135–145)

## 2021-09-26 LAB — CBC
HCT: 40.4 % (ref 39.0–52.0)
Hemoglobin: 13.8 g/dL (ref 13.0–17.0)
MCH: 29.9 pg (ref 26.0–34.0)
MCHC: 34.2 g/dL (ref 30.0–36.0)
MCV: 87.6 fL (ref 80.0–100.0)
Platelets: 80 10*3/uL — ABNORMAL LOW (ref 150–400)
RBC: 4.61 MIL/uL (ref 4.22–5.81)
RDW: 14.3 % (ref 11.5–15.5)
WBC: 4.4 10*3/uL (ref 4.0–10.5)
nRBC: 0 % (ref 0.0–0.2)

## 2021-09-26 LAB — VITAMIN D 25 HYDROXY (VIT D DEFICIENCY, FRACTURES): Vit D, 25-Hydroxy: 20.05 ng/mL — ABNORMAL LOW (ref 30–100)

## 2021-09-26 LAB — VITAMIN B12: Vitamin B-12: 222 pg/mL (ref 180–914)

## 2021-09-26 LAB — TSH: TSH: 0.818 u[IU]/mL (ref 0.350–4.500)

## 2021-09-26 LAB — HEMOGLOBIN A1C
Hgb A1c MFr Bld: 7.9 % — ABNORMAL HIGH (ref 4.8–5.6)
Mean Plasma Glucose: 180.03 mg/dL

## 2021-09-26 LAB — HIV ANTIBODY (ROUTINE TESTING W REFLEX): HIV Screen 4th Generation wRfx: NONREACTIVE

## 2021-09-26 MED ORDER — ACETAMINOPHEN 325 MG PO TABS
650.0000 mg | ORAL_TABLET | Freq: Four times a day (QID) | ORAL | Status: DC | PRN
  Administered 2021-09-26 – 2021-09-29 (×2): 650 mg via ORAL
  Filled 2021-09-26 (×2): qty 2

## 2021-09-26 MED ORDER — ALPRAZOLAM 0.5 MG PO TABS
0.5000 mg | ORAL_TABLET | Freq: Four times a day (QID) | ORAL | Status: DC | PRN
  Administered 2021-09-26 – 2021-09-27 (×2): 0.5 mg via ORAL
  Filled 2021-09-26 (×2): qty 1

## 2021-09-26 NOTE — ED Notes (Signed)
CBG 120 not crossing into EPIC

## 2021-09-26 NOTE — Progress Notes (Signed)
PROGRESS NOTE    Malik Hill  [id number] DOB: [birth year] DOA: 09/25/2021 PCP: Manon Hilding, MD    Chief Complaint  Patient presents with   Weakness    Brief Narrative:  As per H&P written by Dr. Waldron Labs on 09/25/2021 Malik Hill  is a 60 y.o. male, with past medical history for depression, anxiety, hypertension, hyperlipidemia, diabetes mellitus, history of chronic lower back pain/back injury, he presents to ED secondary to episode of generalized weakness, and altered mental status this morning, reported having lack of energy for years, but he is still active, working a full-time job as a Sports coach, patient reports he was recently diagnosed with COVID 3 weeks ago, and he has been doing fairly well until today, he reports woke up this morning, feeling weak, having some fever and chills, reports unable to stand from recliner where he helped himself to the floor couple times and able to get back up on his own, he does report some urine incontinence, but reports this is intermittent problem over the month as he is on hydrochlorothiazide, he denies cough, dysuria (but reports polyuria), no nausea, no vomiting, no chest pain. In ED his work-up significant for hyponatremia of sodium of 128, hypokalemia potassium of 3, and elevated lactic acid at 2.4, white blood cell within normal limit, platelets borderline at 98K, T-max 100.1 in ED,MRI of thoracolumbar spine with no evidence of infectious etiology, CT head nonacute, chest x-ray with no acute findings, [facility]ist consulted to admit.  Assessment & Plan: 1-SIRS (systemic inflammatory response syndrome) (HCC) -Still spiking low-grade temperature -Due to malaise and diffuse weakness reported -No chest pain, no nausea, no vomiting, no dysuria. -Continue empirical antibiotics and follow culture results -Questionable if patient experiencing SIRS criteria post COVID, with adrenal affectation. -will check cortisol  level. -Will follow TSH, B12 and vitamin D levels.  2-DM (diabetes mellitus) ([place]) -Continue sliding scale insulin and follow CBGs fluctuation. -Multifactorial-diet ordered.  3-Essential hypertension -Continue to follow vital signs -Continue holding HCTZ -Continue atenolol and losartan.  4-hyponatremia/hypokalemia -In the setting of diuretics usage -Improved with fluid resuscitation and holding offending agent -Continue to follow electrolytes trend/stability. -Continue telemetry monitoring for 24 hours.  5-anxiety/depression -Continue treatment with Zyprexa and Xanax -Overall stable mood.  6-gastroesophageal reflux disease -Continue PPI.  7-generalized weakness/deconditioning -Follow recommendation by PT/OT.   DVT prophylaxis: Heparin Code Status: Full code. Family Communication: No family at bedside. Disposition:   Status is: Inpatient    Consultants:  None  Procedures:  See below for x-ray reports.  Antimicrobials: Cefepime and  vancomycin   Subjective: Experiencing low-grade temperature; ongoing general malaise, weakness and debility.  Patient denies chest pain, nausea and vomiting.  Objective: Vitals:   09/26/21 0944 09/26/21 1004 09/26/21 1421 09/26/21 1621  BP:  125/80 115/78 (!) 159/93  Pulse:  (!) 101 97 98  Resp:  20 18 20   Temp: 98.2 F (36.8 C) 98.2 F (36.8 C) 99.4 F (37.4 C) 99.6 F (37.6 C)  TempSrc: Oral Oral Oral Oral  SpO2:  95% 95% 96%  Weight:      Height:        Intake/Output Summary (Last 24 hours) at 09/26/2021 1645 Last data filed at 09/26/2021 1504 Gross per 24 hour  Intake 2207.85 ml  Output 1300 ml  Net 907.85 ml   Filed Weights   09/25/21 1329 09/26/21 0708  Weight: 78.5 kg 77.1 kg    Examination:  General exam: Reporting generalized weakness and general malaise.  Low-grade temperature  appreciated.  No nausea, no vomiting, no chest pain. Respiratory system: Clear to auscultation. Respiratory effort normal.   Good saturation on room air. Cardiovascular system: S1 & S2 heard, RRR. No JVD, murmurs, rubs, gallops or clicks. No pedal edema. Gastrointestinal system: Abdomen is nondistended, soft and nontender. No organomegaly or masses felt. Normal bowel sounds heard. Central nervous system: Alert and oriented. No focal neurological deficits. Extremities: No cyanosis or clubbing. Skin: No petechiae. Psychiatry: Judgement and insight appear normal. Mood & affect appropriate.     Data Reviewed: I have personally reviewed following labs and imaging studies  CBC: Recent Labs  Lab 09/25/21 1452 09/26/21 0346  WBC 4.9 4.4  NEUTROABS 4.0  --   HGB 14.6 13.8  HCT 43.1 40.4  MCV 87.2 87.6  PLT 98* 80*    Basic Metabolic Panel: Recent Labs  Lab 09/25/21 1452 09/26/21 0346  NA 128* 133*  K 3.0* 3.3*  CL 96* 101  CO2 20* 22  GLUCOSE 142* 105*  BUN 14 13  CREATININE 1.10 1.07  CALCIUM 8.9 8.5*    GFR: Estimated Creatinine Clearance: 79.1 mL/min (by C-G formula based on SCr of 1.07 mg/dL).  Liver Function Tests: Recent Labs  Lab 09/25/21 1452  AST 67*  ALT 41  ALKPHOS 55  BILITOT 1.8*  PROT 7.4  ALBUMIN 4.0    CBG: Recent Labs  Lab 09/25/21 2318 09/26/21 1112 09/26/21 1613  GLUCAP 142* 150* 192*    Recent Results (from the past 240 hour(s))  Blood Culture (routine x 2)     Status: None (Preliminary result)   Collection Time: 09/25/21  2:53 PM   Specimen: BLOOD  Result Value Ref Range Status   Specimen Description BLOOD LEFT ANTECUBITAL  Final   Special Requests   Final    BOTTLES DRAWN AEROBIC AND ANAEROBIC Blood Culture adequate volume   Culture   Final    NO GROWTH < 24 HOURS Performed at [facility], [address] [place], [place] [address]    Report Status PENDING  Incomplete  Blood Culture (routine x 2)     Status: None (Preliminary result)   Collection Time: 09/25/21  3:38 PM   Specimen: BLOOD LEFT HAND  Result Value Ref Range Status   Specimen  Description BLOOD LEFT HAND  Final   Special Requests   Final    BOTTLES DRAWN AEROBIC AND ANAEROBIC Blood Culture adequate volume   Culture  Setup Time PENDING  Incomplete   Culture   Final    NO GROWTH < 24 HOURS Performed at [facility], [address], [place], [place] [address]    Report Status PENDING  Incomplete  Resp Panel by RT-PCR (Flu A&B, Covid) Nasopharyngeal Swab     Status: None   Collection Time: 09/25/21  4:22 PM   Specimen: Nasopharyngeal Swab; Nasopharyngeal(NP) swabs in vial transport medium  Result Value Ref Range Status   SARS Coronavirus 2 by RT PCR NEGATIVE NEGATIVE Final    Comment: (NOTE) SARS-CoV-2 target nucleic acids are NOT DETECTED.  The SARS-CoV-2 RNA is generally detectable in upper respiratory specimens during the acute phase of infection. The lowest concentration of SARS-CoV-2 viral copies this assay can detect is 138 copies/mL. A negative result does not preclude SARS-Cov-2 infection and should not be used as the sole basis for treatment or other patient management decisions. A negative result may occur with  improper specimen collection/handling, submission of specimen other than nasopharyngeal swab, presence of viral mutation(s) within the areas targeted  by this assay, and inadequate number of viral copies(<138 copies/mL). A negative result must be combined with clinical observations, patient history, and epidemiological information. The expected result is Negative.  Fact Sheet for Patients:  BloggerCourse.com  Fact Sheet for Healthcare Providers:  SeriousBroker.it  This test is no t yet approved or cleared by the [place] FDA and  has been authorized for detection and/or diagnosis of SARS-CoV-2 by FDA under an Emergency Use Authorization (EUA). This EUA will remain  in effect (meaning this test can be used) for the duration of the COVID-19 declaration under Section 564(b)(1) of the  Act, 21 U.S.C.section 360bbb-3(b)(1), unless the authorization is terminated  or revoked sooner.       Influenza A by PCR NEGATIVE NEGATIVE Final   Influenza B by PCR NEGATIVE NEGATIVE Final    Comment: (NOTE) The Xpert Xpress SARS-CoV-2/FLU/RSV plus assay is intended as an aid in the diagnosis of influenza from Nasopharyngeal swab specimens and should not be used as a sole basis for treatment. Nasal washings and aspirates are unacceptable for Xpert Xpress SARS-CoV-2/FLU/RSV testing.  Fact Sheet for Patients: BloggerCourse.com  Fact Sheet for Healthcare Providers: SeriousBroker.it  This test is not yet approved or cleared by the [place] FDA and has been authorized for detection and/or diagnosis of SARS-CoV-2 by FDA under an Emergency Use Authorization (EUA). This EUA will remain in effect (meaning this test can be used) for the duration of the COVID-19 declaration under Section 564(b)(1) of the Act, 21 U.S.C. section 360bbb-3(b)(1), unless the authorization is terminated or revoked.  Performed at [facility], [address], [place], [place] [address]     Radiology Studies: CT Head Wo Contrast  Result Date: 09/25/2021 CLINICAL DATA:  Headache EXAM: CT HEAD WITHOUT CONTRAST TECHNIQUE: Contiguous axial images were obtained from the base of the skull through the vertex without intravenous contrast. COMPARISON:  None. FINDINGS: Brain: No acute intracranial hemorrhage, mass effect, or herniation. No extra-axial fluid collections. No evidence of acute territorial infarct. No hydrocephalus. Vascular: No hyperdense vessel or unexpected calcification. Skull: Normal. Negative for fracture or focal lesion. Sinuses/Orbits: No acute finding. Other: None. IMPRESSION: No acute intracranial process identified. Electronically Signed   By: Jannifer Hick M.D.   On: 09/25/2021 15:20   MR THORACIC SPINE WO CONTRAST  Result Date:  09/25/2021 CLINICAL DATA:  Back pain. Rule out fracture. Rule out epidural abscess. EXAM: MRI THORACIC AND LUMBAR SPINE WITHOUT CONTRAST TECHNIQUE: Multiplanar and multiecho pulse sequences of the thoracic and lumbar spine were obtained without intravenous contrast. COMPARISON:  None FINDINGS: MRI THORACIC SPINE FINDINGS Alignment:  Normal Vertebrae: Negative for fracture, mass, or spinal infection Cord: Cord deformity due to disc protrusions. Spinal cord signal normal throughout. Paraspinal and other soft tissues: Negative for paraspinous mass or adenopathy. No pleural effusion. Disc levels: T1-2: Central disc protrusion without stenosis T2-3: Negative T3-4: Mild disc degeneration.  Negative for stenosis. T4-5: Negative T5-6: Shallow central disc protrusion with cord flattening. No significant stenosis T6-7: Left-sided disc protrusion with mild left foraminal narrowing. T7-8: Moderately large right paracentral disc protrusion and spurring with moderate cord flattening ventrally on the right. Adequate CSF posterior to the cord T8-9: Mild to moderate left-sided disc protrusion without significant stenosis T9-10: Large extruded disc fragment in the midline. There is cord flattening with moderate spinal stenosis. Cord signal normal. T10-11: Negative T11-12: Negative MRI LUMBAR SPINE FINDINGS Segmentation:  5 lumbar segments Alignment:  Normal Vertebrae: Mild chronic compression fracture L1. No acute fracture or mass. Negative  for spinal infection Conus medullaris and cauda equina: Conus extends to the L1 level. Conus and cauda equina appear normal. Paraspinal and other soft tissues: Negative for paraspinous mass or adenopathy. Disc levels: L1-2: Mild disc and facet degeneration.  Negative for stenosis L2-3: Negative L3-4: Mild disc and mild facet degeneration. Shallow left foraminal disc protrusion. Mild left foraminal narrowing. Central canal patent. L4-5: Disc bulging and small central disc protrusion. Bilateral  facet degeneration. No significant stenosis. L5-S1: Moderately large central disc protrusion. L5 nerve roots exit freely. Disc protrusion is touching the S1 nerve roots bilaterally without compression. IMPRESSION: MR THORACIC SPINE IMPRESSION 1. Thoracic multilevel disc degeneration. Multi level disc protrusions. Largest disc protrusion is at T9-10 causing cord flattening and moderate spinal stenosis. 2. Negative for thoracic infection MR LUMBAR SPINE IMPRESSION 1. Negative for lumbar infection.  Chronic fracture L1 2. Shallow left foraminal disc protrusion L3-4. Central disc protrusion L5-S1 touching the S1 nerve roots but not causing nerve root compression. Electronically Signed   By: Marlan Palau M.D.   On: 09/25/2021 19:52   MR LUMBAR SPINE WO CONTRAST  Result Date: 09/25/2021 CLINICAL DATA:  Back pain. Rule out fracture. Rule out epidural abscess. EXAM: MRI THORACIC AND LUMBAR SPINE WITHOUT CONTRAST TECHNIQUE: Multiplanar and multiecho pulse sequences of the thoracic and lumbar spine were obtained without intravenous contrast. COMPARISON:  None FINDINGS: MRI THORACIC SPINE FINDINGS Alignment:  Normal Vertebrae: Negative for fracture, mass, or spinal infection Cord: Cord deformity due to disc protrusions. Spinal cord signal normal throughout. Paraspinal and other soft tissues: Negative for paraspinous mass or adenopathy. No pleural effusion. Disc levels: T1-2: Central disc protrusion without stenosis T2-3: Negative T3-4: Mild disc degeneration.  Negative for stenosis. T4-5: Negative T5-6: Shallow central disc protrusion with cord flattening. No significant stenosis T6-7: Left-sided disc protrusion with mild left foraminal narrowing. T7-8: Moderately large right paracentral disc protrusion and spurring with moderate cord flattening ventrally on the right. Adequate CSF posterior to the cord T8-9: Mild to moderate left-sided disc protrusion without significant stenosis T9-10: Large extruded disc fragment in  the midline. There is cord flattening with moderate spinal stenosis. Cord signal normal. T10-11: Negative T11-12: Negative MRI LUMBAR SPINE FINDINGS Segmentation:  5 lumbar segments Alignment:  Normal Vertebrae: Mild chronic compression fracture L1. No acute fracture or mass. Negative for spinal infection Conus medullaris and cauda equina: Conus extends to the L1 level. Conus and cauda equina appear normal. Paraspinal and other soft tissues: Negative for paraspinous mass or adenopathy. Disc levels: L1-2: Mild disc and facet degeneration.  Negative for stenosis L2-3: Negative L3-4: Mild disc and mild facet degeneration. Shallow left foraminal disc protrusion. Mild left foraminal narrowing. Central canal patent. L4-5: Disc bulging and small central disc protrusion. Bilateral facet degeneration. No significant stenosis. L5-S1: Moderately large central disc protrusion. L5 nerve roots exit freely. Disc protrusion is touching the S1 nerve roots bilaterally without compression. IMPRESSION: MR THORACIC SPINE IMPRESSION 1. Thoracic multilevel disc degeneration. Multi level disc protrusions. Largest disc protrusion is at T9-10 causing cord flattening and moderate spinal stenosis. 2. Negative for thoracic infection MR LUMBAR SPINE IMPRESSION 1. Negative for lumbar infection.  Chronic fracture L1 2. Shallow left foraminal disc protrusion L3-4. Central disc protrusion L5-S1 touching the S1 nerve roots but not causing nerve root compression. Electronically Signed   By: Marlan Palau M.D.   On: 09/25/2021 19:52   DG Chest Port 1 View  Result Date: 09/25/2021 CLINICAL DATA:  Sepsis EXAM: PORTABLE CHEST 1 VIEW COMPARISON:  Chest  x-ray 02/17/2008 FINDINGS: Heart size appears normal. Mediastinum is stable. No focal consolidation identified in the lungs. No pleural effusion or pneumothorax. IMPRESSION: No acute intrathoracic process identified. Electronically Signed   By: Ofilia Neas M.D.   On: 09/25/2021 14:39     Scheduled Meds:  atenolol  25 mg Oral Daily   heparin  5,000 Units Subcutaneous Q8H   insulin aspart  0-15 Units Subcutaneous TID WC   insulin aspart  0-5 Units Subcutaneous QHS   losartan  25 mg Oral Daily   OLANZapine  5 mg Oral Daily   pantoprazole  40 mg Oral BID   Continuous Infusions:  sodium chloride 50 mL/hr at 09/26/21 1504   ceFEPime (MAXIPIME) IV Stopped (09/26/21 1430)   vancomycin Stopped (09/26/21 [id number])     LOS: 0 days    Barton Dubois, MD [facility]   To contact the attending provider between 7A-7P or the covering provider during after hours 7P-7A, please log into the web site [email or web address] and access using universal [place] password for that web site. If you do not have the password, please call the hospital operator.  09/26/2021, 4:45 PM

## 2021-09-26 NOTE — Progress Notes (Signed)
Patient stated they felt anxious, stated they take xanax four times daily at home. MD [facility] aware. New orders given.

## 2021-09-26 NOTE — Progress Notes (Signed)
Patient reported they were hot and sweaty, noted sweat coming down patients back. Blood glucose 192 mg/dL. Vital signs: T-99.6, P-98, R-20, BP-159/93, R-20. MD [facility] aware. New orders given.

## 2021-09-26 NOTE — Progress Notes (Signed)
°  Transition of Care [facility]) Screening Note   Patient Details  Name: Malik Hill Date of Birth: Dec 27, 1960   Transition of Care [facility]) CM/SW Contact:    Leitha Bleak, RN Phone Number: 09/26/2021, 2:38 PM    Transition of Care Department [facility]) has reviewed patient and no TOC needs have been identified at this time. We will continue to monitor patient advancement through interdisciplinary progression rounds. If new patient transition needs arise, please place a TOC consult.

## 2021-09-27 LAB — URINE CULTURE: Culture: NO GROWTH

## 2021-09-27 LAB — GLUCOSE, CAPILLARY
Glucose-Capillary: 201 mg/dL — ABNORMAL HIGH (ref 70–99)
Glucose-Capillary: 170 mg/dL — ABNORMAL HIGH (ref 70–99)
Glucose-Capillary: 238 mg/dL — ABNORMAL HIGH (ref 70–99)
Glucose-Capillary: 226 mg/dL — ABNORMAL HIGH (ref 70–99)

## 2021-09-27 LAB — C-REACTIVE PROTEIN: CRP: 12.7 mg/dL — ABNORMAL HIGH (ref <1.0)

## 2021-09-27 LAB — SEDIMENTATION RATE: Sed Rate: 23 mm/hr — ABNORMAL HIGH (ref 0–16)

## 2021-09-27 MED ORDER — ALPRAZOLAM 1 MG PO TABS
1.0000 mg | ORAL_TABLET | Freq: Four times a day (QID) | ORAL | Status: DC | PRN
  Administered 2021-09-27 – 2021-09-29 (×7): 1 mg via ORAL
  Filled 2021-09-27 (×8): qty 1

## 2021-09-27 MED ORDER — CYANOCOBALAMIN 1000 MCG/ML IJ SOLN
1000.0000 ug | Freq: Once | INTRAMUSCULAR | Status: AC
  Administered 2021-09-27: 09:00:00 1000 ug via INTRAMUSCULAR
  Filled 2021-09-27: qty 1

## 2021-09-27 MED ORDER — VITAMIN B-12 1000 MCG PO TABS
1000.0000 ug | ORAL_TABLET | Freq: Every day | ORAL | Status: DC
  Administered 2021-09-28 – 2021-09-29 (×2): 1000 ug via ORAL
  Filled 2021-09-27 (×2): qty 1

## 2021-09-27 MED ORDER — VITAMIN D (ERGOCALCIFEROL) 1.25 MG (50000 UNIT) PO CAPS
50000.0000 [IU] | ORAL_CAPSULE | ORAL | Status: DC
  Administered 2021-09-27: 09:00:00 50000 [IU] via ORAL
  Filled 2021-09-27: qty 1

## 2021-09-27 MED ORDER — PREDNISONE 20 MG PO TABS
40.0000 mg | ORAL_TABLET | Freq: Every day | ORAL | Status: DC
  Administered 2021-09-27 – 2021-09-29 (×3): 40 mg via ORAL
  Filled 2021-09-27 (×3): qty 2

## 2021-09-27 MED ORDER — DOXYCYCLINE HYCLATE 100 MG PO TABS
100.0000 mg | ORAL_TABLET | Freq: Two times a day (BID) | ORAL | Status: DC
  Administered 2021-09-27 – 2021-09-29 (×5): 100 mg via ORAL
  Filled 2021-09-27 (×5): qty 1

## 2021-09-27 NOTE — Progress Notes (Signed)
PROGRESS NOTE    Jahari Billy  URK:[id number] DOB: April 23, 1961 DOA: 09/25/2021 PCP: Estanislado Pandy, MD    Chief Complaint  Patient presents with   Weakness    Brief Narrative:  As per H&P written by Dr. Randol Kern on 09/25/2021 Malik Hill  is a 60 y.o. male, with past medical history for depression, anxiety, hypertension, hyperlipidemia, diabetes mellitus, history of chronic lower back pain/back injury, he presents to ED secondary to episode of generalized weakness, and altered mental status this morning, reported having lack of energy for years, but he is still active, working a full-time job as a Control and instrumentation engineer, patient reports he was recently diagnosed with COVID 3 weeks ago, and he has been doing fairly well until today, he reports woke up this morning, feeling weak, having some fever and chills, reports unable to stand from recliner where he helped himself to the floor couple times and able to get back up on his own, he does report some urine incontinence, but reports this is intermittent problem over the month as he is on hydrochlorothiazide, he denies cough, dysuria (but reports polyuria), no nausea, no vomiting, no chest pain. In ED his work-up significant for hyponatremia of sodium of 128, hypokalemia potassium of 3, and elevated lactic acid at 2.4, white blood cell within normal limit, platelets borderline at 98K, T-max 100.1 in ED,MRI of thoracolumbar spine with no evidence of infectious etiology, CT head nonacute, chest x-ray with no acute findings, [facility] hospitalist consulted to admit.  Assessment & Plan: 1-SIRS (systemic inflammatory response syndrome) (HCC) -No chest pain, no nausea, no vomiting, no dysuria. -Continue empirical antibiotics and follow culture results -Questionable if patient experiencing SIRS criteria post COVID bronchitis. -Discontinue broad-spectrum antibiotics; will treat with doxycycline and steroids. -Cortisol level is a stable. -Low B12 and  vitamin D, see below for management. -Normal TSH.  2-DM (diabetes mellitus) (HCC) -Continue sliding scale insulin and follow CBGs fluctuation. -Multifactorial-diet ordered.  3-Essential hypertension -Continue to follow vital signs -Continue holding HCTZ -Continue atenolol and losartan.  4-hyponatremia/hypokalemia -In the setting of diuretics usage -Improved with fluid resuscitation and holding offending agent -Continue to follow trend. -Electrolytes have remained stable; will discontinue telemetry.  5-anxiety/depression -Continue treatment with Zyprexa and Xanax -Overall stable mood.  6-gastroesophageal reflux disease -Continue PPI.  7-generalized weakness/deconditioning -Follow recommendations by PT -Evaluation pending. -Patient open-minded for rehabilitation and skilled nursing facility if required.  8-vitamin D/B12 deficiency -Start repletion/supplementation.   DVT prophylaxis: Heparin Code Status: Full code. Family Communication: No family at bedside. Disposition:   Status is: Inpatient    Consultants:  None  Procedures:  See below for x-ray reports.  Antimicrobials: Cefepime and  vancomycin   Subjective: No chest pain, no nausea, no vomiting.  Afebrile for the last 24 hours.  Reports dyspnea on malaise, Weakness,and poor balance.  Objective: Vitals:   09/26/21 1800 09/26/21 2132 09/27/21 0513 09/27/21 1412  BP: 122/85 127/90 116/75 108/74  Pulse: 93 87 95 88  Resp: 20 20 17 16   Temp: 97.8 F (36.6 C) 98.5 F (36.9 C) 98.9 F (37.2 C) 98.2 F (36.8 C)  TempSrc: Oral Oral  Oral  SpO2: 94% 95% 94% 96%  Weight:      Height:        Intake/Output Summary (Last 24 hours) at 09/27/2021 1429 Last data filed at 09/27/2021 1000 Gross per 24 hour  Intake 2920.73 ml  Output 2000 ml  Net 920.73 ml   Filed Weights   09/25/21 1329 09/26/21 0708  Weight: 78.5 kg 77.1 kg    Examination: General exam: Alert, awake, oriented x 3, reports feeling  significantly weak and expressing poor balance.  No chest pain, no nausea, no vomiting and currently afebrile. Respiratory system: Clear to auscultation. Respiratory effort normal. Cardiovascular system:RRR. No murmurs, rubs, gallops. Gastrointestinal system: Abdomen is nondistended, soft and nontender. No organomegaly or masses felt. Normal bowel sounds heard. Central nervous system: Alert and oriented. No focal neurological deficits. Extremities: No cyanosis or clubbing. Skin: No rashes, lesions or ulcers Psychiatry: Judgement and insight appear normal. Mood & affect appropriate.   Data Reviewed: I have personally reviewed following labs and imaging studies  CBC: Recent Labs  Lab 09/25/21 1452 09/26/21 0346  WBC 4.9 4.4  NEUTROABS 4.0  --   HGB 14.6 13.8  HCT 43.1 40.4  MCV 87.2 87.6  PLT 98* 80*    Basic Metabolic Panel: Recent Labs  Lab 09/25/21 1452 09/26/21 0346  NA 128* 133*  K 3.0* 3.3*  CL 96* 101  CO2 20* 22  GLUCOSE 142* 105*  BUN 14 13  CREATININE 1.10 1.07  CALCIUM 8.9 8.5*    GFR: Estimated Creatinine Clearance: 79.1 mL/min (by C-G formula based on SCr of 1.07 mg/dL).  Liver Function Tests: Recent Labs  Lab 09/25/21 1452  AST 67*  ALT 41  ALKPHOS 55  BILITOT 1.8*  PROT 7.4  ALBUMIN 4.0    CBG: Recent Labs  Lab 09/26/21 1112 09/26/21 1613 09/26/21 2135 09/27/21 0726 09/27/21 1125  GLUCAP 150* 192* 172* 201* 170*    Recent Results (from the past 240 hour(s))  Urine Culture     Status: None   Collection Time: 09/25/21  2:11 PM   Specimen: In/Out Cath Urine  Result Value Ref Range Status   Specimen Description   Final    IN/OUT CATH URINE Performed at [facility], [address] [place], [place] [address]    Special Requests   Final    NONE Performed at [facility], [address], [place], [place] [address]    Culture   Final    NO GROWTH Performed at [facility], [address] [place], [place] [address]    Report  Status 09/27/2021 FINAL  Final  Blood Culture (routine x 2)     Status: None (Preliminary result)   Collection Time: 09/25/21  2:53 PM   Specimen: BLOOD  Result Value Ref Range Status   Specimen Description BLOOD LEFT ANTECUBITAL  Final   Special Requests   Final    BOTTLES DRAWN AEROBIC AND ANAEROBIC Blood Culture adequate volume   Culture   Final    NO GROWTH 2 DAYS Performed at [facility], [address], [place], [place] [address]    Report Status PENDING  Incomplete  Blood Culture (routine x 2)     Status: None (Preliminary result)   Collection Time: 09/25/21  3:38 PM   Specimen: BLOOD LEFT HAND  Result Value Ref Range Status   Specimen Description BLOOD LEFT HAND  Final   Special Requests   Final    BOTTLES DRAWN AEROBIC AND ANAEROBIC Blood Culture adequate volume   Culture  Setup Time PENDING  Incomplete   Culture   Final    NO GROWTH 2 DAYS Performed at [facility], [address] [place], [place] [address]    Report Status PENDING  Incomplete  Resp Panel by RT-PCR (Flu A&B, Covid) Nasopharyngeal Swab     Status: None   Collection Time:  09/25/21  4:22 PM   Specimen: Nasopharyngeal Swab; Nasopharyngeal(NP) swabs in vial transport medium  Result Value Ref Range Status   SARS Coronavirus 2 by RT PCR NEGATIVE NEGATIVE Final    Comment: (NOTE) SARS-CoV-2 target nucleic acids are NOT DETECTED.  The SARS-CoV-2 RNA is generally detectable in upper respiratory specimens during the acute phase of infection. The lowest concentration of SARS-CoV-2 viral copies this assay can detect is 138 copies/mL. A negative result does not preclude SARS-Cov-2 infection and should not be used as the sole basis for treatment or other patient management decisions. A negative result may occur with  improper specimen collection/handling, submission of specimen other than nasopharyngeal swab, presence of viral mutation(s) within the areas targeted by this assay, and inadequate number of  viral copies(<138 copies/mL). A negative result must be combined with clinical observations, patient history, and epidemiological information. The expected result is Negative.  Fact Sheet for Patients:  BloggerCourse.com  Fact Sheet for Healthcare Providers:  SeriousBroker.it  This test is no t yet approved or cleared by the [place] FDA and  has been authorized for detection and/or diagnosis of SARS-CoV-2 by FDA under an Emergency Use Authorization (EUA). This EUA will remain  in effect (meaning this test can be used) for the duration of the COVID-19 declaration under Section 564(b)(1) of the Act, 21 U.S.C.section 360bbb-3(b)(1), unless the authorization is terminated  or revoked sooner.       Influenza A by PCR NEGATIVE NEGATIVE Final   Influenza B by PCR NEGATIVE NEGATIVE Final    Comment: (NOTE) The Xpert Xpress SARS-CoV-2/FLU/RSV plus assay is intended as an aid in the diagnosis of influenza from Nasopharyngeal swab specimens and should not be used as a sole basis for treatment. Nasal washings and aspirates are unacceptable for Xpert Xpress SARS-CoV-2/FLU/RSV testing.  Fact Sheet for Patients: BloggerCourse.com  Fact Sheet for Healthcare Providers: SeriousBroker.it  This test is not yet approved or cleared by the [place] FDA and has been authorized for detection and/or diagnosis of SARS-CoV-2 by FDA under an Emergency Use Authorization (EUA). This EUA will remain in effect (meaning this test can be used) for the duration of the COVID-19 declaration under Section 564(b)(1) of the Act, 21 U.S.C. section 360bbb-3(b)(1), unless the authorization is terminated or revoked.  Performed at [facility], [address], [place], [place] [address]     Radiology Studies: CT Head Wo Contrast  Result Date: 09/25/2021 CLINICAL DATA:  Headache EXAM: CT HEAD WITHOUT  CONTRAST TECHNIQUE: Contiguous axial images were obtained from the base of the skull through the vertex without intravenous contrast. COMPARISON:  None. FINDINGS: Brain: No acute intracranial hemorrhage, mass effect, or herniation. No extra-axial fluid collections. No evidence of acute territorial infarct. No hydrocephalus. Vascular: No hyperdense vessel or unexpected calcification. Skull: Normal. Negative for fracture or focal lesion. Sinuses/Orbits: No acute finding. Other: None. IMPRESSION: No acute intracranial process identified. Electronically Signed   By: Jannifer Hick M.D.   On: 09/25/2021 15:20   MR THORACIC SPINE WO CONTRAST  Result Date: 09/25/2021 CLINICAL DATA:  Back pain. Rule out fracture. Rule out epidural abscess. EXAM: MRI THORACIC AND LUMBAR SPINE WITHOUT CONTRAST TECHNIQUE: Multiplanar and multiecho pulse sequences of the thoracic and lumbar spine were obtained without intravenous contrast. COMPARISON:  None FINDINGS: MRI THORACIC SPINE FINDINGS Alignment:  Normal Vertebrae: Negative for fracture, mass, or spinal infection Cord: Cord deformity due to disc protrusions. Spinal cord signal normal throughout. Paraspinal and other soft tissues: Negative for paraspinous mass or  adenopathy. No pleural effusion. Disc levels: T1-2: Central disc protrusion without stenosis T2-3: Negative T3-4: Mild disc degeneration.  Negative for stenosis. T4-5: Negative T5-6: Shallow central disc protrusion with cord flattening. No significant stenosis T6-7: Left-sided disc protrusion with mild left foraminal narrowing. T7-8: Moderately large right paracentral disc protrusion and spurring with moderate cord flattening ventrally on the right. Adequate CSF posterior to the cord T8-9: Mild to moderate left-sided disc protrusion without significant stenosis T9-10: Large extruded disc fragment in the midline. There is cord flattening with moderate spinal stenosis. Cord signal normal. T10-11: Negative T11-12: Negative  MRI LUMBAR SPINE FINDINGS Segmentation:  5 lumbar segments Alignment:  Normal Vertebrae: Mild chronic compression fracture L1. No acute fracture or mass. Negative for spinal infection Conus medullaris and cauda equina: Conus extends to the L1 level. Conus and cauda equina appear normal. Paraspinal and other soft tissues: Negative for paraspinous mass or adenopathy. Disc levels: L1-2: Mild disc and facet degeneration.  Negative for stenosis L2-3: Negative L3-4: Mild disc and mild facet degeneration. Shallow left foraminal disc protrusion. Mild left foraminal narrowing. Central canal patent. L4-5: Disc bulging and small central disc protrusion. Bilateral facet degeneration. No significant stenosis. L5-S1: Moderately large central disc protrusion. L5 nerve roots exit freely. Disc protrusion is touching the S1 nerve roots bilaterally without compression. IMPRESSION: MR THORACIC SPINE IMPRESSION 1. Thoracic multilevel disc degeneration. Multi level disc protrusions. Largest disc protrusion is at T9-10 causing cord flattening and moderate spinal stenosis. 2. Negative for thoracic infection MR LUMBAR SPINE IMPRESSION 1. Negative for lumbar infection.  Chronic fracture L1 2. Shallow left foraminal disc protrusion L3-4. Central disc protrusion L5-S1 touching the S1 nerve roots but not causing nerve root compression. Electronically Signed   By: Marlan Palau M.D.   On: 09/25/2021 19:52   MR LUMBAR SPINE WO CONTRAST  Result Date: 09/25/2021 CLINICAL DATA:  Back pain. Rule out fracture. Rule out epidural abscess. EXAM: MRI THORACIC AND LUMBAR SPINE WITHOUT CONTRAST TECHNIQUE: Multiplanar and multiecho pulse sequences of the thoracic and lumbar spine were obtained without intravenous contrast. COMPARISON:  None FINDINGS: MRI THORACIC SPINE FINDINGS Alignment:  Normal Vertebrae: Negative for fracture, mass, or spinal infection Cord: Cord deformity due to disc protrusions. Spinal cord signal normal throughout. Paraspinal and  other soft tissues: Negative for paraspinous mass or adenopathy. No pleural effusion. Disc levels: T1-2: Central disc protrusion without stenosis T2-3: Negative T3-4: Mild disc degeneration.  Negative for stenosis. T4-5: Negative T5-6: Shallow central disc protrusion with cord flattening. No significant stenosis T6-7: Left-sided disc protrusion with mild left foraminal narrowing. T7-8: Moderately large right paracentral disc protrusion and spurring with moderate cord flattening ventrally on the right. Adequate CSF posterior to the cord T8-9: Mild to moderate left-sided disc protrusion without significant stenosis T9-10: Large extruded disc fragment in the midline. There is cord flattening with moderate spinal stenosis. Cord signal normal. T10-11: Negative T11-12: Negative MRI LUMBAR SPINE FINDINGS Segmentation:  5 lumbar segments Alignment:  Normal Vertebrae: Mild chronic compression fracture L1. No acute fracture or mass. Negative for spinal infection Conus medullaris and cauda equina: Conus extends to the L1 level. Conus and cauda equina appear normal. Paraspinal and other soft tissues: Negative for paraspinous mass or adenopathy. Disc levels: L1-2: Mild disc and facet degeneration.  Negative for stenosis L2-3: Negative L3-4: Mild disc and mild facet degeneration. Shallow left foraminal disc protrusion. Mild left foraminal narrowing. Central canal patent. L4-5: Disc bulging and small central disc protrusion. Bilateral facet degeneration. No significant stenosis. L5-S1: Moderately  large central disc protrusion. L5 nerve roots exit freely. Disc protrusion is touching the S1 nerve roots bilaterally without compression. IMPRESSION: MR THORACIC SPINE IMPRESSION 1. Thoracic multilevel disc degeneration. Multi level disc protrusions. Largest disc protrusion is at T9-10 causing cord flattening and moderate spinal stenosis. 2. Negative for thoracic infection MR LUMBAR SPINE IMPRESSION 1. Negative for lumbar infection.   Chronic fracture L1 2. Shallow left foraminal disc protrusion L3-4. Central disc protrusion L5-S1 touching the S1 nerve roots but not causing nerve root compression. Electronically Signed   By: Marlan Palau M.D.   On: 09/25/2021 19:52    Scheduled Meds:  atenolol  25 mg Oral Daily   doxycycline  100 mg Oral Q12H   heparin  5,000 Units Subcutaneous Q8H   insulin aspart  0-15 Units Subcutaneous TID WC   insulin aspart  0-5 Units Subcutaneous QHS   losartan  25 mg Oral Daily   OLANZapine  5 mg Oral Daily   pantoprazole  40 mg Oral BID   predniSONE  40 mg Oral Q breakfast   [START ON 09/28/2021] vitamin B-12  1,000 mcg Oral Daily   Vitamin D (Ergocalciferol)  50,000 Units Oral Q7 days   Continuous Infusions:  sodium chloride Stopped (09/27/21 0425)     LOS: 1 day    Vassie Loll, MD [facility]   To contact the attending provider between 7A-7P or the covering provider during after hours 7P-7A, please log into the web site [email or web address] and access using universal [place] password for that web site. If you do not have the password, please call the hospital operator.  09/27/2021, 2:29 PM

## 2021-09-27 NOTE — Progress Notes (Signed)
Patient resting in bed. Patient has ambulated to bedside commode and reports far more weak than baseline. Patient voiced concerns about xanax and this was addressed with the hospitalist and changed. Patients voices no complaints at this time.

## 2021-09-28 LAB — GLUCOSE, CAPILLARY
Glucose-Capillary: 169 mg/dL — ABNORMAL HIGH (ref 70–99)
Glucose-Capillary: 227 mg/dL — ABNORMAL HIGH (ref 70–99)
Glucose-Capillary: 307 mg/dL — ABNORMAL HIGH (ref 70–99)
Glucose-Capillary: 217 mg/dL — ABNORMAL HIGH (ref 70–99)

## 2021-09-28 MED ORDER — VITAMIN B-6 50 MG PO TABS
100.0000 mg | ORAL_TABLET | Freq: Every day | ORAL | Status: DC
  Administered 2021-09-28 – 2021-09-29 (×2): 100 mg via ORAL
  Filled 2021-09-28 (×2): qty 2

## 2021-09-28 NOTE — Progress Notes (Signed)
PROGRESS NOTE    Malik Hill  ZSW:[id number] DOB: 04/08/1961 DOA: 09/25/2021 PCP: Manon Hilding, MD    Chief Complaint  Patient presents with   Weakness    Brief Narrative:  As per H&P written by Dr. Waldron Labs on 09/25/2021 Malik Hill  is a 60 y.o. male, with past medical history for depression, anxiety, hypertension, hyperlipidemia, diabetes mellitus, history of chronic lower back pain/back injury, he presents to ED secondary to episode of generalized weakness, and altered mental status this morning, reported having lack of energy for years, but he is still active, working a full-time job as a Sports coach, patient reports he was recently diagnosed with COVID 3 weeks ago, and he has been doing fairly well until today, he reports woke up this morning, feeling weak, having some fever and chills, reports unable to stand from recliner where he helped himself to the floor couple times and able to get back up on his own, he does report some urine incontinence, but reports this is intermittent problem over the month as he is on hydrochlorothiazide, he denies cough, dysuria (but reports polyuria), no nausea, no vomiting, no chest pain. In ED his work-up significant for hyponatremia of sodium of 128, hypokalemia potassium of 3, and elevated lactic acid at 2.4, white blood cell within normal limit, platelets borderline at 98K, T-max 100.1 in ED,MRI of thoracolumbar spine with no evidence of infectious etiology, CT head nonacute, chest x-ray with no acute findings, [facility] hospitalist consulted to admit.  Assessment & Plan: 1-SIRS (systemic inflammatory response syndrome) (HCC) -No chest pain, no nausea, no vomiting, no dysuria. -Continue empirical antibiotics and follow culture results -Questionable if patient experiencing SIRS criteria post COVID bronchitis. -Elevated ESR and CRP appreciated. -Discontinue broad-spectrum antibiotics (patient afebrile for the last 48 hours); will treat  with doxycycline and steroids empirically. -Cortisol level stable/within normal limits. -Low B12 and vitamin D, see below for management. -Normal TSH.  2-DM (diabetes mellitus) (HCC) -Continue sliding scale insulin and follow CBGs fluctuation. -Multifactorial-diet ordered.  3-Essential hypertension -Continue to follow vital signs -Will continue to hold HCTZ at this moment. -Continue atenolol and losartan.  4-hyponatremia/hypokalemia -In the setting of diuretics usage -Improved with fluid resuscitation and holding offending agent -Continue to follow trend. -Electrolytes have remained stable; will discontinue telemetry.  5-anxiety/depression -Continue treatment with Zyprexa and Xanax -Overall stable mood. -No hallucinations.  6-gastroesophageal reflux disease -Continue PPI.  7-generalized weakness/deconditioning -Follow recommendations by PT -Evaluation continue to be pending at this moment. -Patient remains generally weak, complaining of poor balance and history of multiple falls while at home.  Unfortunately lacking appropriate level of assistance and support at this moment.   -Open-minded for rehabilitation and skilled nursing facility if required.  8-vitamin D/B12 deficiency -Continue repletion and supplementation.  9-sundowning/disorientation/confusion -Transient and nighttime -Constant reorientation and supportive cares will be provided -Patient is afebrile and asymptomatic currently.   DVT prophylaxis: Heparin Code Status: Full code. Family Communication: No family at bedside. Disposition:   Status is: Inpatient    Consultants:  None  Procedures:  See below for x-ray reports.  Antimicrobials: Cefepime and  vancomycin 12/22>>12/24 Doxycycline 12/24>>   Subjective: Alert, awake, oriented x 3 currently; no fever (for 48 hours now), no chest pain, no nausea, no vomiting.  Reports feeling awful due to generalized weakness, poor balance and overnight  episode of confusion/disorientation.  Objective: Vitals:   09/27/21 2108 09/28/21 0445 09/28/21 0857 09/28/21 1230  BP: 104/65 134/85 124/84 116/79  Pulse: 78 69 87 81  Resp: '20 18  18  ' Temp: (!) 97.3 F (36.3 C) 97.8 F (36.6 C)  98.1 F (36.7 C)  TempSrc:      SpO2: 93% 100%  97%  Weight:      Height:        Intake/Output Summary (Last 24 hours) at 09/28/2021 1334 Last data filed at 09/28/2021 0900 Gross per 24 hour  Intake 758.32 ml  Output 400 ml  Net 358.32 ml   Filed Weights   09/25/21 1329 09/26/21 0708  Weight: 78.5 kg 77.1 kg    Examination: General exam: In no major distress currently; patient is afebrile. Respiratory system: Clear to auscultation. Respiratory effort normal.  No using accessory muscle.  Good saturation on room air. Cardiovascular system:RRR. No murmurs, rubs, gallops.  No JVD. Gastrointestinal system: Abdomen is nondistended, soft and nontender. No organomegaly or masses felt. Normal bowel sounds heard. Central nervous system: Alert and oriented. No focal neurological deficits.  Expressed feeling generally weak. Extremities: No cyanosis, clubbing or edema. Skin: No petechiae. Psychiatry: Judgement and insight appear normal. Mood & affect appropriate.    Data Reviewed: I have personally reviewed following labs and imaging studies  CBC: Recent Labs  Lab 09/25/21 1452 09/26/21 0346  WBC 4.9 4.4  NEUTROABS 4.0  --   HGB 14.6 13.8  HCT 43.1 40.4  MCV 87.2 87.6  PLT 98* 80*    Basic Metabolic Panel: Recent Labs  Lab 09/25/21 1452 09/26/21 0346  NA 128* 133*  K 3.0* 3.3*  CL 96* 101  CO2 20* 22  GLUCOSE 142* 105*  BUN 14 13  CREATININE 1.10 1.07  CALCIUM 8.9 8.5*    GFR: Estimated Creatinine Clearance: 79.1 mL/min (by C-G formula based on SCr of 1.07 mg/dL).  Liver Function Tests: Recent Labs  Lab 09/25/21 1452  AST 67*  ALT 41  ALKPHOS 55  BILITOT 1.8*  PROT 7.4  ALBUMIN 4.0    CBG: Recent Labs  Lab  09/27/21 1125 09/27/21 1642 09/27/21 2106 09/28/21 0736 09/28/21 1119  GLUCAP 170* 238* 226* 169* 227*    Recent Results (from the past 240 hour(s))  Urine Culture     Status: None   Collection Time: 09/25/21  2:11 PM   Specimen: In/Out Cath Urine  Result Value Ref Range Status   Specimen Description   Final    IN/OUT CATH URINE Performed at [facility], [address], [place], [place] [address]    Special Requests   Final    NONE Performed at [facility], [address] [place], [place] [address]    Culture   Final    NO GROWTH Performed at [facility], [place] [address] [place], [place] [address]    Report Status 09/27/2021 FINAL  Final  Blood Culture (routine x 2)     Status: None (Preliminary result)   Collection Time: 09/25/21  2:53 PM   Specimen: BLOOD  Result Value Ref Range Status   Specimen Description BLOOD LEFT ANTECUBITAL  Final   Special Requests   Final    BOTTLES DRAWN AEROBIC AND ANAEROBIC Blood Culture adequate volume   Culture   Final    NO GROWTH 2 DAYS Performed at [facility], [address] [place], [place] [address]    Report Status PENDING  Incomplete  Blood Culture (routine x 2)     Status: None (Preliminary result)   Collection Time: 09/25/21  3:38 PM   Specimen: BLOOD LEFT HAND  Result Value Ref Range  Status   Specimen Description BLOOD LEFT HAND  Final   Special Requests   Final    BOTTLES DRAWN AEROBIC AND ANAEROBIC Blood Culture adequate volume   Culture  Setup Time PENDING  Incomplete   Culture   Final    NO GROWTH 2 DAYS Performed at [facility], [address], [place], [place] [address]    Report Status PENDING  Incomplete  Resp Panel by RT-PCR (Flu A&B, Covid) Nasopharyngeal Swab     Status: None   Collection Time: 09/25/21  4:22 PM   Specimen: Nasopharyngeal Swab; Nasopharyngeal(NP) swabs in vial transport medium  Result Value Ref Range Status   SARS Coronavirus 2 by RT PCR NEGATIVE NEGATIVE Final    Comment:  (NOTE) SARS-CoV-2 target nucleic acids are NOT DETECTED.  The SARS-CoV-2 RNA is generally detectable in upper respiratory specimens during the acute phase of infection. The lowest concentration of SARS-CoV-2 viral copies this assay can detect is 138 copies/mL. A negative result does not preclude SARS-Cov-2 infection and should not be used as the sole basis for treatment or other patient management decisions. A negative result may occur with  improper specimen collection/handling, submission of specimen other than nasopharyngeal swab, presence of viral mutation(s) within the areas targeted by this assay, and inadequate number of viral copies(<138 copies/mL). A negative result must be combined with clinical observations, patient history, and epidemiological information. The expected result is Negative.  Fact Sheet for Patients:  EntrepreneurPulse.com.au  Fact Sheet for Healthcare Providers:  IncredibleEmployment.be  This test is no t yet approved or cleared by the [place] FDA and  has been authorized for detection and/or diagnosis of SARS-CoV-2 by FDA under an Emergency Use Authorization (EUA). This EUA will remain  in effect (meaning this test can be used) for the duration of the COVID-19 declaration under Section 564(b)(1) of the Act, 21 U.S.C.section 360bbb-3(b)(1), unless the authorization is terminated  or revoked sooner.       Influenza A by PCR NEGATIVE NEGATIVE Final   Influenza B by PCR NEGATIVE NEGATIVE Final    Comment: (NOTE) The Xpert Xpress SARS-CoV-2/FLU/RSV plus assay is intended as an aid in the diagnosis of influenza from Nasopharyngeal swab specimens and should not be used as a sole basis for treatment. Nasal washings and aspirates are unacceptable for Xpert Xpress SARS-CoV-2/FLU/RSV testing.  Fact Sheet for Patients: EntrepreneurPulse.com.au  Fact Sheet for Healthcare  Providers: IncredibleEmployment.be  This test is not yet approved or cleared by the [place] FDA and has been authorized for detection and/or diagnosis of SARS-CoV-2 by FDA under an Emergency Use Authorization (EUA). This EUA will remain in effect (meaning this test can be used) for the duration of the COVID-19 declaration under Section 564(b)(1) of the Act, 21 U.S.C. section 360bbb-3(b)(1), unless the authorization is terminated or revoked.  Performed at [facility], [address], [place], [place] [address]     Radiology Studies: No results found.  Scheduled Meds:  atenolol  25 mg Oral Daily   doxycycline  100 mg Oral Q12H   heparin  5,000 Units Subcutaneous Q8H   insulin aspart  0-15 Units Subcutaneous TID WC   insulin aspart  0-5 Units Subcutaneous QHS   losartan  25 mg Oral Daily   OLANZapine  5 mg Oral Daily   pantoprazole  40 mg Oral BID   predniSONE  40 mg Oral Q breakfast   vitamin B-6  100 mg Oral Daily   vitamin B-12  1,000 mcg Oral Daily  Vitamin D (Ergocalciferol)  50,000 Units Oral Q7 days   Continuous Infusions:  sodium chloride 50 mL/hr at 09/28/21 0543     LOS: 2 days    Barton Dubois, MD [facility]   To contact the attending provider between 7A-7P or the covering provider during after hours 7P-7A, please log into the web site [email or web address] and access using universal  password for that web site. If you do not have the password, please call the hospital operator.  09/28/2021, 1:34 PM

## 2021-09-29 DIAGNOSIS — J40 Bronchitis, not specified as acute or chronic: Principal | ICD-10-CM

## 2021-09-29 DIAGNOSIS — R531 Weakness: Secondary | ICD-10-CM

## 2021-09-29 LAB — CBC
HCT: 41.5 % (ref 39.0–52.0)
Hemoglobin: 13.6 g/dL (ref 13.0–17.0)
MCH: 29.5 pg (ref 26.0–34.0)
MCHC: 32.8 g/dL (ref 30.0–36.0)
MCV: 90 fL (ref 80.0–100.0)
Platelets: 101 10*3/uL — ABNORMAL LOW (ref 150–400)
RBC: 4.61 MIL/uL (ref 4.22–5.81)
RDW: 14.4 % (ref 11.5–15.5)
WBC: 3.4 10*3/uL — ABNORMAL LOW (ref 4.0–10.5)
nRBC: 0 % (ref 0.0–0.2)

## 2021-09-29 LAB — BASIC METABOLIC PANEL
Anion gap: 13 (ref 5–15)
BUN: 12 mg/dL (ref 8–23)
CO2: 21 mmol/L — ABNORMAL LOW (ref 22–32)
Calcium: 8.4 mg/dL — ABNORMAL LOW (ref 8.9–10.3)
Chloride: 103 mmol/L (ref 98–111)
Creatinine, Ser: 0.82 mg/dL (ref 0.61–1.24)
GFR, Estimated: >60 mL/min (ref >60)
Glucose, Bld: 171 mg/dL — ABNORMAL HIGH (ref 70–99)
Potassium: 3.5 mmol/L (ref 3.5–5.1)
Sodium: 137 mmol/L (ref 135–145)

## 2021-09-29 LAB — GLUCOSE, CAPILLARY
Glucose-Capillary: 150 mg/dL — ABNORMAL HIGH (ref 70–99)
Glucose-Capillary: 147 mg/dL — ABNORMAL HIGH (ref 70–99)
Glucose-Capillary: 212 mg/dL — ABNORMAL HIGH (ref 70–99)
Glucose-Capillary: 209 mg/dL — ABNORMAL HIGH (ref 70–99)

## 2021-09-29 LAB — MAGNESIUM: Magnesium: 2.3 mg/dL (ref 1.7–2.4)

## 2021-09-29 MED ORDER — TRAZODONE HCL 50 MG PO TABS: 100.0000 mg | ORAL_TABLET | Freq: Every day | ORAL | Status: DC

## 2021-09-29 MED ORDER — PYRIDOXINE HCL 100 MG PO TABS: 100.0000 mg | ORAL_TABLET | Freq: Every day | ORAL | 5 refills | Status: DC

## 2021-09-29 MED ORDER — VITAMIN D (ERGOCALCIFEROL) 1.25 MG (50000 UNIT) PO CAPS: 50000.0000 [IU] | ORAL_CAPSULE | ORAL | 0 refills | Status: AC

## 2021-09-29 MED ORDER — TRAMADOL HCL 50 MG PO TABS: 50.0000 mg | ORAL_TABLET | Freq: Three times a day (TID) | ORAL | 0 refills | Status: DC | PRN

## 2021-09-29 MED ORDER — PREDNISONE 20 MG PO TABS: ORAL_TABLET | ORAL | 0 refills | Status: DC

## 2021-09-29 MED ORDER — CYANOCOBALAMIN 1000 MCG PO TABS: 1000.0000 ug | ORAL_TABLET | Freq: Every day | ORAL | 5 refills | Status: DC

## 2021-09-29 MED ORDER — DOXYCYCLINE HYCLATE 100 MG PO TABS: 100.0000 mg | ORAL_TABLET | Freq: Two times a day (BID) | ORAL | 0 refills | Status: AC

## 2021-09-29 MED ORDER — PAROXETINE HCL 20 MG PO TABS
60.0000 mg | ORAL_TABLET | Freq: Every day | ORAL | Status: DC
  Administered 2021-09-29: 10:00:00 60 mg via ORAL
  Filled 2021-09-29: qty 3

## 2021-09-29 NOTE — TOC Transition Note (Signed)
Transition of Care [facility]) - CM/SW Discharge Note   Patient Details  Name: Malik Hill MRN: [id number] Date of Birth: 1961-08-28  [facility] CM/SW Contact:  Karn Cassis, LCSW Phone Number: 09/29/2021, 4:00 PM   Clinical Narrative:  Pt d/c today. PT recommending outpatient therapy. Discussed with pt who reports he has used [facility] outpatient therapy in the past and would like to go there again. Order completed and information printed out for referral. Will fax tomorrow as unable to reach facility (possibly closed for holiday) for fax number.  No other needs reported at this time.     Final next level of care: OP Rehab Barriers to Discharge: Barriers Resolved   Patient Goals and CMS Choice Patient states their goals for this hospitalization and ongoing recovery are:: return home   Choice offered to / list presented to : Patient  Discharge Placement                  Name of family member notified: pt Patient and family notified of of transfer: 09/29/21  Discharge Plan and Services                                     Social Determinants of Health (SDOH) Interventions     Readmission Risk Interventions No flowsheet data found.

## 2021-09-29 NOTE — Discharge Summary (Signed)
Physician Discharge Summary  Malik Hill WUJ:[id number] DOB: 04/02/1961 DOA: 09/25/2021  PCP: Manon Hilding, MD  Admit date: 09/25/2021 Discharge date: 09/29/2021  Time spent: 35 minutes  Recommendations for Outpatient Follow-up:  In 8-12 weeks repeat vitamin D level and B12 level; continue further repletion as needed. Repeat basic metabolic panel to follow twice renal function. Reassess blood pressure and adjust antihypertensive regimen as needed.  Discharge Diagnoses:  Principal Problem:   SIRS (systemic inflammatory response syndrome) (HCC) Active Problems:   DM (diabetes mellitus) ([place])   Essential hypertension   Depression   Weakness   Bronchitis   Discharge Condition: Stable and improved.  Discharged home with instruction to follow-up with PCP in 10 days.  CODE STATUS: Full code.  Diet recommendation: Heart healthy/modified carbohydrate diet.  Filed Weights   09/25/21 1329 09/26/21 0708  Weight: 78.5 kg 77.1 kg    History of present illness:  As per H&P written by Dr. Waldron Labs on 09/25/2021 Malik Hill  is a 60 y.o. male, with past medical history for depression, anxiety, hypertension, hyperlipidemia, diabetes mellitus, history of chronic lower back pain/back injury, he presents to ED secondary to episode of generalized weakness, and altered mental status this morning, reported having lack of energy for years, but he is still active, working a full-time job as a Sports coach, patient reports he was recently diagnosed with COVID 3 weeks ago, and he has been doing fairly well until today, he reports woke up this morning, feeling weak, having some fever and chills, reports unable to stand from recliner where he helped himself to the floor couple times and able to get back up on his own, he does report some urine incontinence, but reports this is intermittent problem over the month as he is on hydrochlorothiazide, he denies cough, dysuria (but reports  polyuria), no nausea, no vomiting, no chest pain. In ED his work-up significant for hyponatremia of sodium of 128, hypokalemia potassium of 3, and elevated lactic acid at 2.4, white blood cell within normal limit, platelets borderline at 98K, T-max 100.1 in ED,MRI of thoracolumbar spine with no evidence of infectious etiology, CT head nonacute, chest x-ray with no acute findings, [facility] hospitalist consulted to admit.  Hospital Course:  1-SIRS (systemic inflammatory response syndrome) (HCC) -No chest pain, no nausea, no vomiting, no dysuria. -Continue empirical antibiotics and follow culture results -Questionable if patient experiencing SIRS criteria post COVID bronchitis. -Elevated ESR and CRP appreciated. -Initially treated with broad-spectrum antibiotics; cultures without any growth; patient's management transition to oral doxycycline along with the use of his steroids.  Afebrile for 96 hours prior to discharge.  -Patient will complete therapy with oral doxycycline and also steroids tapering. -Cortisol level stable and within normal limits. -Low B12 and vitamin D, see below for management. -Normal TSH. -Physical therapy work with patient and recommendations for outpatient therapy and use of rolling walker provided.   2-DM (diabetes mellitus) ([place]) -Modified carbohydrate diet encouraged -Advised to maintain adequate hydration -Resume home hypoglycemic regimen.   3-Essential hypertension -Continue to follow vital signs -Continue the use of atenolol, losartan and HCTZ. -Patient advised to follow heart healthy diet.   4-hyponatremia/hypokalemia -In the setting of diuretics usage and decreased oral intake. -Electrolytes have been repleted and within normal limits at time of discharge. -Patient advised to maintain adequate nutrition and hydration -Safe to resume home HCTZ. -Recommending to repeat basic metabolic panel at follow-up visit to assess electrolytes  stability.  5-anxiety/depression -Continue treatment with Zyprexa and Xanax -Overall stable  mood.   6-gastroesophageal reflux disease -Continue PPI.   7-generalized weakness/deconditioning -Follow recommendations by PT -Evaluation continue to be pending at this moment. -Patient remains generally weak, complaining of poor balance and history of multiple falls while at home.  Unfortunately lacking appropriate level of assistance and support at this moment.   -Open-minded for rehabilitation and skilled nursing facility if required.   8-vitamin D/B12 deficiency -Continue repletion and supplementation. -Patient also discharge on daily B6 supplementation.   9-sundowning/disorientation/confusion -Transient and at nighttime -Constant reorientation and supportive care provided -home psychotropic meds resumed (trazodone and paxil) -asymptomatic at discharge  Procedures: See below for x-ray reports.  Consultations: None  Discharge Exam: Vitals:   09/29/21 0422 09/29/21 1358  BP: 108/67 128/82  Pulse: 72 83  Resp: 20 20  Temp: (!) 97.4 F (36.3 C) 98 F (36.7 C)  SpO2: 97% 97%    General: Reports feeling better, no chest pain, no nausea, no vomiting, patient is afebrile.  Some improvement in his generalized weakness reported, patient did good with physical therapy evaluation and found to be safe to go home with outpatient rehabilitation. Cardiovascular: S1-S2, no rubs, no gallops, no JVD Respiratory: Good air movement bilaterally; no wheezing, no crackles, no using accessory muscles.  Good saturation on room air. Abdomen: Soft, nontender, positive bowel sounds. Extremities: No cyanosis or clubbing.  Discharge Instructions   Discharge Instructions     Ambulatory referral to Physical Therapy   Complete by: As directed    Diet - low sodium heart healthy   Complete by: As directed    Discharge instructions   Complete by: As directed    Take medications as  prescribed Follow up with Outpatient physical therapy for further rehabilitation and conditioning as recommended. Arrange follow up with PCP in 10 days.   Increase activity slowly   Complete by: As directed    No wound care   Complete by: As directed       Allergies as of 09/29/2021       Reactions   Penicillins Rash        Medication List     STOP taking these medications    Carafate 1 GM/10ML suspension Generic drug: sucralfate   clonazePAM 1 MG tablet Commonly known as: KLONOPIN   cyclobenzaprine 10 MG tablet Commonly known as: FLEXERIL   HYDROcodone-acetaminophen 7.5-325 MG tablet Commonly known as: NORCO   naproxen 500 MG tablet Commonly known as: NAPROSYN   OLANZAPINE-FLUOXETINE HCL PO   oxyCODONE-acetaminophen 5-325 MG tablet Commonly known as: PERCOCET/ROXICET   venlafaxine XR 150 MG 24 hr capsule Commonly known as: EFFEXOR-XR   venlafaxine XR 75 MG 24 hr capsule Commonly known as: EFFEXOR-XR       TAKE these medications    ALPRAZolam 1 MG tablet Commonly known as: XANAX Take 1 mg by mouth 4 (four) times daily as needed. What changed: Another medication with the same name was removed. Continue taking this medication, and follow the directions you see here.   atenolol 25 MG tablet Commonly known as: TENORMIN Take by mouth daily.   cyanocobalamin 1000 MCG tablet Take 1 tablet (1,000 mcg total) by mouth daily. Start taking on: September 30, 2021   doxycycline 100 MG tablet Commonly known as: VIBRA-TABS Take 1 tablet (100 mg total) by mouth every 12 (twelve) hours for 4 days.   glipiZIDE 10 MG 24 hr tablet Commonly known as: GLUCOTROL XL Take 10 mg by mouth 2 (two) times daily.   hydrochlorothiazide 12.5 MG capsule Commonly  known as: MICROZIDE Take 12.5 mg by mouth daily.   Jardiance 25 MG Tabs tablet Generic drug: empagliflozin Take 25 mg by mouth daily.   losartan 25 MG tablet Commonly known as: COZAAR Take 25 mg by mouth  daily.   metFORMIN 500 MG 24 hr tablet Commonly known as: GLUCOPHAGE-XR TAKE 1 TABLET BY MOUTH DAILY   OLANZapine 5 MG tablet Commonly known as: ZYPREXA Take 5 mg by mouth daily.   ondansetron 8 MG disintegrating tablet Commonly known as: ZOFRAN-ODT ALLOW ONE TABLET TO DISSOLVE ON THE TONGUE EVERY 6 TO 8 HOURS PRN FOR NAUSEA OR VOMITING What changed: Another medication with the same name was removed. Continue taking this medication, and follow the directions you see here.   pantoprazole 40 MG tablet Commonly known as: PROTONIX Take 40 mg by mouth 2 (two) times daily.   PARoxetine 30 MG tablet Commonly known as: PAXIL Take 60 mg by mouth daily. What changed: Another medication with the same name was removed. Continue taking this medication, and follow the directions you see here.   predniSONE 20 MG tablet Commonly known as: DELTASONE Take 2 tablets by mouth daily x2 days; then 1 tablet by mouth daily x3 days; then half tablet by mouth daily x3 days and stop prednisone.   pyridoxine 100 MG tablet Commonly known as: B-6 Take 1 tablet (100 mg total) by mouth daily. Start taking on: September 30, 2021   traMADol 50 MG tablet Commonly known as: ULTRAM Take 1 tablet (50 mg total) by mouth every 8 (eight) hours as needed for severe pain. What changed:  when to take this reasons to take this   traZODone 100 MG tablet Commonly known as: DESYREL Take 100 mg by mouth daily.   Trulicity 1.5 SP/2.3RA Sopn Generic drug: Dulaglutide Inject 1.5 mg into the skin once a week.   Vitamin D (Ergocalciferol) 1.25 MG (50000 UNIT) Caps capsule Commonly known as: DRISDOL Take 1 capsule (50,000 Units total) by mouth every 7 (seven) days. Start taking on: October 04, 2021       Allergies  Allergen Reactions   Penicillins Rash    Follow-up Information     Sasser, Silvestre Moment, MD. Schedule an appointment as soon as possible for a visit in 10 day(s).   Specialty: Family Medicine Contact  information: [place] [phone]                 The results of significant diagnostics from this hospitalization (including imaging, microbiology, ancillary and laboratory) are listed below for reference.    Significant Diagnostic Studies: CT Head Wo Contrast  Result Date: 09/25/2021 CLINICAL DATA:  Headache EXAM: CT HEAD WITHOUT CONTRAST TECHNIQUE: Contiguous axial images were obtained from the base of the skull through the vertex without intravenous contrast. COMPARISON:  None. FINDINGS: Brain: No acute intracranial hemorrhage, mass effect, or herniation. No extra-axial fluid collections. No evidence of acute territorial infarct. No hydrocephalus. Vascular: No hyperdense vessel or unexpected calcification. Skull: Normal. Negative for fracture or focal lesion. Sinuses/Orbits: No acute finding. Other: None. IMPRESSION: No acute intracranial process identified. Electronically Signed   By: Ofilia Neas M.D.   On: 09/25/2021 15:20   MR THORACIC SPINE WO CONTRAST  Result Date: 09/25/2021 CLINICAL DATA:  Back pain. Rule out fracture. Rule out epidural abscess. EXAM: MRI THORACIC AND LUMBAR SPINE WITHOUT CONTRAST TECHNIQUE: Multiplanar and multiecho pulse sequences of the thoracic and lumbar spine were obtained without intravenous contrast. COMPARISON:  None FINDINGS: MRI  THORACIC SPINE FINDINGS Alignment:  Normal Vertebrae: Negative for fracture, mass, or spinal infection Cord: Cord deformity due to disc protrusions. Spinal cord signal normal throughout. Paraspinal and other soft tissues: Negative for paraspinous mass or adenopathy. No pleural effusion. Disc levels: T1-2: Central disc protrusion without stenosis T2-3: Negative T3-4: Mild disc degeneration.  Negative for stenosis. T4-5: Negative T5-6: Shallow central disc protrusion with cord flattening. No significant stenosis T6-7: Left-sided disc protrusion with mild left foraminal narrowing. T7-8: Moderately large  right paracentral disc protrusion and spurring with moderate cord flattening ventrally on the right. Adequate CSF posterior to the cord T8-9: Mild to moderate left-sided disc protrusion without significant stenosis T9-10: Large extruded disc fragment in the midline. There is cord flattening with moderate spinal stenosis. Cord signal normal. T10-11: Negative T11-12: Negative MRI LUMBAR SPINE FINDINGS Segmentation:  5 lumbar segments Alignment:  Normal Vertebrae: Mild chronic compression fracture L1. No acute fracture or mass. Negative for spinal infection Conus medullaris and cauda equina: Conus extends to the L1 level. Conus and cauda equina appear normal. Paraspinal and other soft tissues: Negative for paraspinous mass or adenopathy. Disc levels: L1-2: Mild disc and facet degeneration.  Negative for stenosis L2-3: Negative L3-4: Mild disc and mild facet degeneration. Shallow left foraminal disc protrusion. Mild left foraminal narrowing. Central canal patent. L4-5: Disc bulging and small central disc protrusion. Bilateral facet degeneration. No significant stenosis. L5-S1: Moderately large central disc protrusion. L5 nerve roots exit freely. Disc protrusion is touching the S1 nerve roots bilaterally without compression. IMPRESSION: MR THORACIC SPINE IMPRESSION 1. Thoracic multilevel disc degeneration. Multi level disc protrusions. Largest disc protrusion is at T9-10 causing cord flattening and moderate spinal stenosis. 2. Negative for thoracic infection MR LUMBAR SPINE IMPRESSION 1. Negative for lumbar infection.  Chronic fracture L1 2. Shallow left foraminal disc protrusion L3-4. Central disc protrusion L5-S1 touching the S1 nerve roots but not causing nerve root compression. Electronically Signed   By: Franchot Gallo M.D.   On: 09/25/2021 19:52   MR LUMBAR SPINE WO CONTRAST  Result Date: 09/25/2021 CLINICAL DATA:  Back pain. Rule out fracture. Rule out epidural abscess. EXAM: MRI THORACIC AND LUMBAR SPINE  WITHOUT CONTRAST TECHNIQUE: Multiplanar and multiecho pulse sequences of the thoracic and lumbar spine were obtained without intravenous contrast. COMPARISON:  None FINDINGS: MRI THORACIC SPINE FINDINGS Alignment:  Normal Vertebrae: Negative for fracture, mass, or spinal infection Cord: Cord deformity due to disc protrusions. Spinal cord signal normal throughout. Paraspinal and other soft tissues: Negative for paraspinous mass or adenopathy. No pleural effusion. Disc levels: T1-2: Central disc protrusion without stenosis T2-3: Negative T3-4: Mild disc degeneration.  Negative for stenosis. T4-5: Negative T5-6: Shallow central disc protrusion with cord flattening. No significant stenosis T6-7: Left-sided disc protrusion with mild left foraminal narrowing. T7-8: Moderately large right paracentral disc protrusion and spurring with moderate cord flattening ventrally on the right. Adequate CSF posterior to the cord T8-9: Mild to moderate left-sided disc protrusion without significant stenosis T9-10: Large extruded disc fragment in the midline. There is cord flattening with moderate spinal stenosis. Cord signal normal. T10-11: Negative T11-12: Negative MRI LUMBAR SPINE FINDINGS Segmentation:  5 lumbar segments Alignment:  Normal Vertebrae: Mild chronic compression fracture L1. No acute fracture or mass. Negative for spinal infection Conus medullaris and cauda equina: Conus extends to the L1 level. Conus and cauda equina appear normal. Paraspinal and other soft tissues: Negative for paraspinous mass or adenopathy. Disc levels: L1-2: Mild disc and facet degeneration.  Negative for stenosis L2-3:  Negative L3-4: Mild disc and mild facet degeneration. Shallow left foraminal disc protrusion. Mild left foraminal narrowing. Central canal patent. L4-5: Disc bulging and small central disc protrusion. Bilateral facet degeneration. No significant stenosis. L5-S1: Moderately large central disc protrusion. L5 nerve roots exit freely.  Disc protrusion is touching the S1 nerve roots bilaterally without compression. IMPRESSION: MR THORACIC SPINE IMPRESSION 1. Thoracic multilevel disc degeneration. Multi level disc protrusions. Largest disc protrusion is at T9-10 causing cord flattening and moderate spinal stenosis. 2. Negative for thoracic infection MR LUMBAR SPINE IMPRESSION 1. Negative for lumbar infection.  Chronic fracture L1 2. Shallow left foraminal disc protrusion L3-4. Central disc protrusion L5-S1 touching the S1 nerve roots but not causing nerve root compression. Electronically Signed   By: Franchot Gallo M.D.   On: 09/25/2021 19:52   DG Chest Port 1 View  Result Date: 09/25/2021 CLINICAL DATA:  Sepsis EXAM: PORTABLE CHEST 1 VIEW COMPARISON:  Chest x-ray 02/17/2008 FINDINGS: Heart size appears normal. Mediastinum is stable. No focal consolidation identified in the lungs. No pleural effusion or pneumothorax. IMPRESSION: No acute intrathoracic process identified. Electronically Signed   By: Ofilia Neas M.D.   On: 09/25/2021 14:39    Microbiology: Recent Results (from the past 240 hour(s))  Urine Culture     Status: None   Collection Time: 09/25/21  2:11 PM   Specimen: In/Out Cath Urine  Result Value Ref Range Status   Specimen Description   Final    IN/OUT CATH URINE Performed at [facility], [address] [place], [place] [address]    Special Requests   Final    NONE Performed at [facility], [address], [place], [place] [address]    Culture   Final    NO GROWTH Performed at [facility], [place] [address], [place], [place] [address]    Report Status 09/27/2021 FINAL  Final  Blood Culture (routine x 2)     Status: None (Preliminary result)   Collection Time: 09/25/21  2:53 PM   Specimen: BLOOD  Result Value Ref Range Status   Specimen Description BLOOD LEFT ANTECUBITAL  Final   Special Requests   Final    BOTTLES DRAWN AEROBIC AND ANAEROBIC Blood Culture adequate volume   Culture   Final     NO GROWTH 4 DAYS Performed at [facility], [address] [place], [place] [address]    Report Status PENDING  Incomplete  Blood Culture (routine x 2)     Status: None (Preliminary result)   Collection Time: 09/25/21  3:38 PM   Specimen: BLOOD LEFT HAND  Result Value Ref Range Status   Specimen Description BLOOD LEFT HAND  Final   Special Requests   Final    BOTTLES DRAWN AEROBIC AND ANAEROBIC Blood Culture adequate volume   Culture  Setup Time PENDING  Incomplete   Culture   Final    NO GROWTH 4 DAYS Performed at [facility], [address] [place],  [address]    Report Status PENDING  Incomplete  Resp Panel by RT-PCR (Flu A&B, Covid) Nasopharyngeal Swab     Status: None   Collection Time: 09/25/21  4:22 PM   Specimen: Nasopharyngeal Swab; Nasopharyngeal(NP) swabs in vial transport medium  Result Value Ref Range Status   SARS Coronavirus 2 by RT PCR NEGATIVE NEGATIVE Final    Comment: (NOTE) SARS-CoV-2 target nucleic acids are NOT DETECTED.  The SARS-CoV-2 RNA is generally detectable in upper respiratory specimens during the acute phase of  infection. The lowest concentration of SARS-CoV-2 viral copies this assay can detect is 138 copies/mL. A negative result does not preclude SARS-Cov-2 infection and should not be used as the sole basis for treatment or other patient management decisions. A negative result may occur with  improper specimen collection/handling, submission of specimen other than nasopharyngeal swab, presence of viral mutation(s) within the areas targeted by this assay, and inadequate number of viral copies(<138 copies/mL). A negative result must be combined with clinical observations, patient history, and epidemiological information. The expected result is Negative.  Fact Sheet for Patients:  EntrepreneurPulse.com.au  Fact Sheet for Healthcare Providers:  IncredibleEmployment.be  This test is no t yet approved or  cleared by the [place] FDA and  has been authorized for detection and/or diagnosis of SARS-CoV-2 by FDA under an Emergency Use Authorization (EUA). This EUA will remain  in effect (meaning this test can be used) for the duration of the COVID-19 declaration under Section 564(b)(1) of the Act, 21 U.S.C.section 360bbb-3(b)(1), unless the authorization is terminated  or revoked sooner.       Influenza A by PCR NEGATIVE NEGATIVE Final   Influenza B by PCR NEGATIVE NEGATIVE Final    Comment: (NOTE) The Xpert Xpress SARS-CoV-2/FLU/RSV plus assay is intended as an aid in the diagnosis of influenza from Nasopharyngeal swab specimens and should not be used as a sole basis for treatment. Nasal washings and aspirates are unacceptable for Xpert Xpress SARS-CoV-2/FLU/RSV testing.  Fact Sheet for Patients: EntrepreneurPulse.com.au  Fact Sheet for Healthcare Providers: IncredibleEmployment.be  This test is not yet approved or cleared by the [place] FDA and has been authorized for detection and/or diagnosis of SARS-CoV-2 by FDA under an Emergency Use Authorization (EUA). This EUA will remain in effect (meaning this test can be used) for the duration of the COVID-19 declaration under Section 564(b)(1) of the Act, 21 U.S.C. section 360bbb-3(b)(1), unless the authorization is terminated or revoked.  Performed at [facility], [address] [place], [place] [address]      Labs: Basic Metabolic Panel: Recent Labs  Lab 09/25/21 1452 09/26/21 0346 09/29/21 0441  NA 128* 133* 137  K 3.0* 3.3* 3.5  CL 96* 101 103  CO2 20* 22 21*  GLUCOSE 142* 105* 171*  BUN _0 CREATININE 1.10 1.07 0.82  CALCIUM 8.9 8.5* 8.4*  MG  --   --  2.3   Liver Function Tests: Recent Labs  Lab 09/25/21 1452  AST 67*  ALT 41  ALKPHOS 55  BILITOT 1.8*  PROT 7.4  ALBUMIN 4.0   CBC: Recent Labs  Lab 09/25/21 1452 09/26/21 0346 09/29/21 0441  WBC 4.9  4.4 3.4*  NEUTROABS 4.0  --   --   HGB 14.6 13.8 13.6  HCT 43.1 40.4 41.5  MCV 87.2 87.6 90.0  PLT 98* 80* 101*   BNP (last 3 results) Recent Labs    09/25/21 1455  BNP 40.0   CBG: Recent Labs  Lab 09/28/21 1611 09/28/21 2106 09/29/21 0540 09/29/21 0737 09/29/21 1145  GLUCAP 307* 217* 150* 147* 212*   Signed:  Barton Dubois MD.  [facility] 09/29/2021, 3:58 PM

## 2021-09-29 NOTE — Plan of Care (Signed)
Problem: Acute Rehab PT Goals(only PT should resolve) Goal: Pt Will Ambulate Outcome: Completed/Met Flowsheets (Taken 09/29/2021 1412) Pt will Ambulate:  > 125 feet  with supervision  with rolling walker  2:12 PM, 09/29/21 M. Sherlyn Lees, PT, DPT Physical Therapist- [place] Office Number: [phone]

## 2021-09-29 NOTE — Evaluation (Addendum)
Physical Therapy Evaluation Patient Details Name: Malik Hill MRN: [id number] DOB: November 28, 1960 Today's Date: 09/29/2021  History of Present Illness  Malik Hill  is a 60 y.o. male, with past medical history for depression, anxiety, hypertension, hyperlipidemia, diabetes mellitus, history of chronic lower back pain/back injury, he presents to ED secondary to episode of generalized weakness, and altered mental status this morning, reported having lack of energy for years, but he is still active, working a full-time job as a Sports coach, patient reports he was recently diagnosed with COVID 3 weeks ago, and he has been doing fairly well until today, he reports woke up this morning, feeling weak, having some fever and chills, reports unable to stand from recliner where he helped himself to the floor couple times and able to get back up on his own, he does report some urine incontinence, but reports this is intermittent problem over the month as he is on hydrochlorothiazide, he denies cough, dysuria (but reports polyuria), no nausea, no vomiting, no chest pain.  In ED his work-up significant for hyponatremia of sodium of 128, hypokalemia potassium of 3, and elevated lactic acid at 2.4, white blood cell within normal limit, platelets borderline at 98K, T-max 100.1 in ED,MRI of thoracolumbar spine with no evidence of infectious etiology, CT head nonacute, chest x-ray with no acute findings,   Clinical Impression   Patient able to perform bed mobility at a modified independent level just requiring increased time.  Pt denies any pain or abnormal symptoms in his back or BLE at this time and able to perform transfers with a set-up to supervision level using RW at this time.  Continued with ambulation on level surfaces using RW and supervision but no LOB experienced during ambulation, negotiation of obstacles, and able to perform toileting transfers and hygiene without assistance.  Recommend supervision  level for transition back to home using RW for transfers and ambulation.  If lingering deficits/activity limitations present at discharge recommend f/u with outpatient therapy services to improve strength, balance, and activity tolerance to restore capabilities to PLOF.   Patient discharged to care of nursing for ambulation daily as tolerated for length of stay with supervision level using RW     Recommendations for follow up therapy are one component of a multi-disciplinary discharge planning process, led by the attending physician.  Recommendations may be updated based on patient status, additional functional criteria and insurance authorization.  Follow Up Recommendations Outpatient PT    Assistance Recommended at Discharge Intermittent Supervision/Assistance  Functional Status Assessment Patient has had a recent decline in their functional status and demonstrates the ability to make significant improvements in function in a reasonable and predictable amount of time.  Equipment Recommendations  None recommended by PT (pt reports he has RW at home)    Recommendations for Other Services       Precautions / Restrictions        Mobility  Bed Mobility Overal bed mobility: Independent                  Transfers Overall transfer level: Needs assistance   Transfers: Sit to/from Stand;Bed to chair/wheelchair/BSC Sit to Stand: Supervision Stand pivot transfers: Supervision              Ambulation/Gait Ambulation/Gait assistance: Supervision Gait Distance (Feet): 125 Feet Assistive device: Rolling walker (2 wheels) Gait Pattern/deviations: Decreased stride length Gait velocity: decreased, no LOB during turns or negotiating doorways        Stairs  Wheelchair Mobility    Modified Rankin (Stroke Patients Only)       Balance Overall balance assessment: Needs assistance Sitting-balance support: No upper extremity supported Sitting balance-Leahy  Scale: Normal       Standing balance-Leahy Scale: Good           Rhomberg - Eyes Opened: 30 Rhomberg - Eyes Closed: 30 High level balance activites: Side stepping;Direction changes;Turns               Pertinent Vitals/Pain Pain Assessment: No/denies pain    Home Living Family/patient expects to be discharged to:: Private residence Living Arrangements: Spouse/significant other Available Help at Discharge: Family Type of Home: House Home Access: Stairs to enter Entrance Stairs-Rails: Can reach both Entrance Stairs-Number of Steps: 2-3   Home Layout: Able to live on main level with bedroom/bathroom Home Equipment: Conservation officer, nature (2 wheels)      Prior Function Prior Level of Function : Independent/Modified Independent                     Hand Dominance        Extremity/Trunk Assessment   Upper Extremity Assessment Upper Extremity Assessment: Overall WFL for tasks assessed    Lower Extremity Assessment Lower Extremity Assessment: Overall WFL for tasks assessed    Cervical / Trunk Assessment Cervical / Trunk Assessment: Normal  Communication   Communication: No difficulties  Cognition Arousal/Alertness: Awake/alert Behavior During Therapy: WFL for tasks assessed/performed Overall Cognitive Status: Within Functional Limits for tasks assessed                                          General Comments      Exercises     Assessment/Plan    PT Assessment All further PT needs can be met in the next venue of care  PT Problem List Decreased activity tolerance;Other (comment) (reduced ability to safely ambulate due to fear of falling)       PT Treatment Interventions      PT Goals (Current goals can be found in the Care Plan section)  Acute Rehab PT Goals Patient Stated Goal: Return home when able PT Goal Formulation: With patient/family Time For Goal Achievement: 10/06/21 Potential to Achieve Goals: Good    Frequency      Barriers to discharge        Co-evaluation               AM-PAC PT "6 Clicks" Mobility  Outcome Measure Help needed turning from your back to your side while in a flat bed without using bedrails?: None Help needed moving from lying on your back to sitting on the side of a flat bed without using bedrails?: None Help needed moving to and from a bed to a chair (including a wheelchair)?: A Little Help needed standing up from a chair using your arms (e.g., wheelchair or bedside chair)?: None Help needed to walk in hospital room?: A Little Help needed climbing 3-5 steps with a railing? : A Little 6 Click Score: 21    End of Session Equipment Utilized During Treatment: Gait belt Activity Tolerance: Patient tolerated treatment well Patient left: in bed;with family/visitor present Nurse Communication: Mobility status PT Visit Diagnosis: Unsteadiness on feet (R26.81);Difficulty in walking, not elsewhere classified (R26.2);Muscle weakness (generalized) (M62.81)    Time: 1287-8676 PT Time Calculation (min) (ACUTE ONLY): 38 min   Charges:  PT Evaluation $PT Eval Low Complexity: 1 Low PT Treatments $Gait Training: 8-22 mins        2:06 PM, 09/29/21 M. Sherlyn Lees, PT, DPT Physical Therapist- [place] Office Number: [phone]

## 2021-10-01 LAB — CULTURE, BLOOD (ROUTINE X 2)
Culture: NO GROWTH
Special Requests: ADEQUATE

## 2021-10-03 LAB — CULTURE, BLOOD (ROUTINE X 2)
Culture: NO GROWTH
Special Requests: ADEQUATE

## 2022-08-11 ENCOUNTER — Ambulatory Visit: Payer: BC Managed Care – PPO | Admitting: Psychiatry

## 2022-08-11 ENCOUNTER — Telehealth: Payer: Self-pay

## 2022-08-11 ENCOUNTER — Telehealth: Payer: Self-pay | Admitting: Psychiatry

## 2022-08-11 ENCOUNTER — Encounter: Payer: Self-pay | Admitting: *Deleted

## 2022-08-11 VITALS — BP 103/71 | HR 90 | Ht 76.0 in | Wt 260.1 lb

## 2022-08-11 DIAGNOSIS — I639 Cerebral infarction, unspecified: Secondary | ICD-10-CM | POA: Diagnosis not present

## 2022-08-11 DIAGNOSIS — R413 Other amnesia: Secondary | ICD-10-CM | POA: Diagnosis not present

## 2022-08-11 DIAGNOSIS — G43709 Chronic migraine without aura, not intractable, without status migrainosus: Secondary | ICD-10-CM | POA: Diagnosis not present

## 2022-08-11 MED ORDER — EMGALITY 120 MG/ML ~~LOC~~ SOAJ: 240.0000 mg | Freq: Once | SUBCUTANEOUS | 0 refills | Status: AC

## 2022-08-11 MED ORDER — EMGALITY 120 MG/ML ~~LOC~~ SOAJ: 120.0000 mg | SUBCUTANEOUS | 6 refills | Status: DC

## 2022-08-11 MED ORDER — UBRELVY 50 MG PO TABS: 50.0000 mg | ORAL_TABLET | ORAL | 6 refills | Status: DC | PRN

## 2022-08-11 NOTE — Progress Notes (Signed)
[facility]  PATIENT: Malik Hill DOB: 08/16/1961  REFERRING CLINICIAN: Sasser, Clarene Critchley, MD HISTORY FROM: self, wife REASON FOR VISIT: headaches, memory loss   HISTORICAL  CHIEF COMPLAINT:  Chief Complaint  Patient presents with   New Patient (Initial Visit)    Pt reports feeling okay. He states yesterday he had an concussion and hit his head. Only thing he took was Tylenol. He states the only time it does not hurt is if his eyes are closed. He has questions about the stroke he had that was seen on MRI. Room 1 with wife    HISTORY OF PRESENT ILLNESS:  The patient presents for evaluation of headaches and memory loss.   Memory issues began around 2022. States he has always had a very good memory previously. His boss pulled him aside and mentioned that he had started making mistakes at work.   In December 2022 he had an episode where he fell to the floor and was admitted to the hospital. He was told that he was speaking gibberish and urinating on himself, but he does not have any memory of this. There are about 2 days where he was in the hospital and had no memory of it. Blood work revealed hyponatremia (128), hypokalemia, and elevated lactic acid. MRI L-spine showed a chronic fracture at L1 and a disc bulge at L3-4 and L5-S1. MRI T-spine showed cord flattening and moderate spinal stenosis at T9-10. He was referred to physical therapy for his balance but did not go. States he has followed with Orthopedics who recommended surgery, which he declined.  He continues to have memory issues despite correction of his electrolyte imbalances. He has stopped working since December 2022. Forgets conversations he has had previously and will lose his train of thought in the middle of a sentence. Forgot his dog's name. Mixes up dates. He has been sleeping a lot. Will nap in the morning, eat lunch, then take another nap. States he does sleep well at night, but does not feel safe in  his bed so he sleeps on the couch. He has missed bills and had the cable turned off because of this. He is still driving, but only goes to the doctor and to the grocery store. He did get lost going to the grocery store one time. Wife does not drive much due to her own medical issues. He does have a history of anxiety/depression and takes Prozac and Seroquel. His anxiety is severe and sometimes will prevent him from leaving the house. States he is going to cancel another doctor's appointment he has today as he does not think he can handle multiple appointments in one day.  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 He was unaware he had a stroke before then. He was started on ASA 81 mg daily.  Headaches began following a concussion in September 2023 (patient states this was 3 weeks ago, but ED visit is dated at 06/23/22). At that time he slipped coming out of the shower and fell, hitting his head. Said he hit the head so hard he broke the water line. Presented to the ED where [facility] was negative for acute process. He is currently having headaches daily. They are described as throbbing pain in the back of his head/neck which radiates forward. Headaches are associated with photophobia and phonophobia. Takes Tylenol as needed which does not help. Cannot take ibuprofen due to kidney issues.  Vitamin B12  level in December 2022 was 222. He was started on B12 supplements. A1c 7.9. TSH was normal.  OTHER MEDICAL CONDITIONS: depression, anxiety, HTN, DM, CVA, kidney stones   REVIEW OF SYSTEMS: Full 14 system review of systems performed and negative with exception of: headaches, memory loss  ALLERGIES: Allergies  Allergen Reactions   Penicillins Rash    HOME MEDICATIONS: Outpatient Medications Prior to Visit  Medication Sig Dispense Refill   ALPRAZolam (XANAX) 1 MG tablet Take 1 mg by mouth 4 (four) times daily as needed.     atenolol (TENORMIN)  25 MG tablet Take by mouth daily.     glipiZIDE (GLUCOTROL XL) 10 MG 24 hr tablet Take 10 mg by mouth 2 (two) times daily.     hydrochlorothiazide (MICROZIDE) 12.5 MG capsule Take 12.5 mg by mouth daily.  3   JARDIANCE 25 MG TABS tablet Take 25 mg by mouth daily.     losartan (COZAAR) 25 MG tablet Take 25 mg by mouth daily.  6   metFORMIN (GLUCOPHAGE-XR) 500 MG 24 hr tablet TAKE 1 TABLET BY MOUTH DAILY  6   PARoxetine (PAXIL) 30 MG tablet Take 60 mg by mouth daily.     pyridOXINE (B-6) 100 MG tablet Take 1 tablet (100 mg total) by mouth daily. 30 tablet 5   QUEtiapine (SEROQUEL) 100 MG tablet Take 100 mg by mouth at bedtime.     traZODone (DESYREL) 100 MG tablet Take 100 mg by mouth daily.     TRULICITY 1.5 MG/0.5ML SOPN Inject 1.5 mg into the skin once a week.     vitamin B-12 1000 MCG tablet Take 1 tablet (1,000 mcg total) by mouth daily. 30 tablet 5   Vitamin D, Ergocalciferol, (DRISDOL) 1.25 MG (50000 UNIT) CAPS capsule Take 1 capsule (50,000 Units total) by mouth every 7 (seven) days. 12 capsule 0   OLANZapine (ZYPREXA) 5 MG tablet Take 5 mg by mouth daily. (Patient not taking: Reported on 08/11/2022)     ondansetron (ZOFRAN-ODT) 8 MG disintegrating tablet ALLOW ONE TABLET TO DISSOLVE ON THE TONGUE EVERY 6 TO 8 HOURS PRN FOR NAUSEA OR VOMITING (Patient not taking: Reported on 08/11/2022)  0   pantoprazole (PROTONIX) 40 MG tablet Take 40 mg by mouth 2 (two) times daily. (Patient not taking: Reported on 08/11/2022)  1   predniSONE (DELTASONE) 20 MG tablet Take 2 tablets by mouth daily x2 days; then 1 tablet by mouth daily x3 days; then half tablet by mouth daily x3 days and stop prednisone. (Patient not taking: Reported on 08/11/2022) 10 tablet 0   traMADol (ULTRAM) 50 MG tablet Take 1 tablet (50 mg total) by mouth every 8 (eight) hours as needed for severe pain. (Patient not taking: Reported on 08/11/2022) 20 tablet 0   No facility-administered medications prior to visit.    PAST MEDICAL  HISTORY: Past Medical History:  Diagnosis Date   Anxiety    BPH (benign prostatic hyperplasia)    Depression    Diabetes mellitus without complication (HCC)    Headache    Kidney stones    MDD (major depressive disorder)    Memory loss    Osteoarthritis     PAST SURGICAL HISTORY: Past Surgical History:  Procedure Laterality Date   BUNIONECTOMY     COLONOSCOPY  06/09/2007   KNEE SURGERY Left 2015   PENILE ADHESIONS LYSIS     TONSILLECTOMY AND ADENOIDECTOMY     age 55    FAMILY HISTORY: Family History  Problem Relation Age  of Onset   Hypertension Mother    Hypothyroidism Mother    Heart attack Father    Cancer Sister     SOCIAL HISTORY: Social History   Socioeconomic History   Marital status: Married    Spouse name: Not on file   Number of children: Not on file   Years of education: Not on file   Highest education level: Not on file  Occupational History   Not on file  Tobacco Use   Smoking status: Former    Types: Cigarettes   Smokeless tobacco: Never  Substance and Sexual Activity   Alcohol use: Never   Drug use: No   Sexual activity: Not on file  Other Topics Concern   Not on file  Social History Narrative   Not on file   Social Determinants of Health   Financial Resource Strain: Not on file  Food Insecurity: Not on file  Transportation Needs: Not on file  Physical Activity: Not on file  Stress: Not on file  Social Connections: Not on file  Intimate Partner Violence: Not on file     PHYSICAL EXAM  GENERAL EXAM/CONSTITUTIONAL: Vitals:  Vitals:   08/11/22 1115  BP: 103/71  Pulse: 90  Weight: 260 lb 2 oz (118 kg)  Height: 6\' 4"  (1.93 m)   Body mass index is 31.66 kg/m. Wt Readings from Last 3 Encounters:  08/11/22 260 lb 2 oz (118 kg)  09/26/21 170 lb (77.1 kg)  08/10/16 228 lb (103.4 kg)   NEUROLOGIC: MENTAL STATUS:     08/11/2022   11:21 AM  Montreal Cognitive Assessment   Visuospatial/ Executive (0/5) 0  Naming (0/3) 1   Attention: Read list of digits (0/2) 2  Attention: Read list of letters (0/1) 1  Attention: Serial 7 subtraction starting at 100 (0/3) 1  Language: Repeat phrase (0/2) 1  Language : Fluency (0/1) 0  Abstraction (0/2) 2  Delayed Recall (0/5) 2  Orientation (0/6) 6  Total 16  Adjusted Score (based on education) 16   CRANIAL NERVE:  2nd, 3rd, 4th, 6th - pupils equal and reactive to light, visual fields full to confrontation, extraocular muscles intact, no nystagmus 5th - facial sensation symmetric 7th - facial strength symmetric 8th - hearing intact 9th - palate elevates symmetrically, uvula midline 11th - shoulder shrug symmetric 12th - tongue protrusion midline  MOTOR:  normal bulk and tone, full strength in the BUE, BLE  SENSORY:  normal and symmetric to light touch all 4 extremities  COORDINATION:  finger-nose-finger, fine finger movements normal, fine action tremor present bilaterally (states this is baseline and runs in his family)  REFLEXES:  deep tendon reflexes present and symmetric  GAIT/STATION:  Slow, normal-based gait with decreased arm swing      DIAGNOSTIC DATA (LABS, IMAGING, TESTING) - I reviewed patient records, labs, notes, testing and imaging myself where available.  Lab Results  Component Value Date   WBC 3.4 (L) 09/29/2021   HGB 13.6 09/29/2021   HCT 41.5 09/29/2021   MCV 90.0 09/29/2021   PLT 101 (L) 09/29/2021      Component Value Date/Time   NA 137 09/29/2021 0441   K 3.5 09/29/2021 0441   CL 103 09/29/2021 0441   CO2 21 (L) 09/29/2021 0441   GLUCOSE 171 (H) 09/29/2021 0441   BUN 12 09/29/2021 0441   CREATININE 0.82 09/29/2021 0441   CALCIUM 8.4 (L) 09/29/2021 0441   PROT 7.4 09/25/2021 1452   ALBUMIN 4.0 09/25/2021 1452  AST 67 (H) 09/25/2021 1452   ALT 41 09/25/2021 1452   ALKPHOS 55 09/25/2021 1452   BILITOT 1.8 (H) 09/25/2021 1452   GFRNONAA >60 09/29/2021 0441   GFRAA >60 08/10/2016 1331   No results found for: "CHOL",  "HDL", "LDLCALC", "LDLDIRECT", "TRIG", "CHOLHDL" Lab Results  Component Value Date   HGBA1C 7.9 (H) 09/26/2021   Lab Results  Component Value Date   VITAMINB12 222 09/26/2021   Lab Results  Component Value Date   TSH 0.818 09/26/2021    ASSESSMENT AND PLAN  61 y.o. year old male with a history of depression, anxiety, HTN, DM, CVA, kidney stones who presents for memory loss and headaches. MRI brain with generalized atrophy and a remote right parietal infarct. Will check CTA head/neck and TTE for underlying stroke risk factors. He states he recently had blood work done by his PCP. Will have those results faxed over to our office. Suspect he will need a statin pending lipid panel results. MOCA today is 16/30, which is concerning for dementia especially given significant impact to his ADLs. His recent concussion, severe anxiety, and irregular sleep schedule may also be contributing to his memory loss. Will refer to neuropsychology to better characterize his memory deficits.  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 He is unable to take Topamax due to kidney stones. Already takes a beta blocker, losartan, and anti-depressants. Will start Emgality for migraine prevention. Triptans are contraindicated due to his history of stroke. Will start Ubrelvy for migraine rescue.   1. Memory loss   2. Cerebrovascular accident (CVA), unspecified mechanism ([place])      PLAN: Stroke: -Will have blood work faxed from [facility] office -CTA head/neck -TTE -Continue ASA 81 mg daily, will likely start statin pending lipid panel results  Memory loss: -Referral to Neuropsychology  Migraines: -Prevention: Start Emgality 120 mg monthly. Loading dose given in office today -Rescue: Start Ubrelvy 50 mg PRN   Orders Placed This Encounter  Procedures   CT ANGIO HEAD W OR WO CONTRAST   CT ANGIO NECK W OR WO CONTRAST   Ambulatory referral to Neuropsychology    ECHOCARDIOGRAM COMPLETE BUBBLE STUDY    Meds ordered this encounter  Medications   Ubrogepant (UBRELVY) 50 MG TABS    Sig: Take 50 mg by mouth as needed. May repeat a dose in 2 hours if headache persists. Max dose 2 pills in 24 hours    Dispense:  16 tablet    Refill:  6   Galcanezumab-gnlm (EMGALITY) 120 MG/ML SOAJ    Sig: Inject 120 mg into the skin every 30 (thirty) days.    Dispense:  1.12 mL    Refill:  6    Return in about 3 months (around 11/11/2022).    Genia Harold, MD 08/11/22 12:37 PM  I spent an average of 76 minutes chart reviewing and counseling the patient, with at least 50% of the time face to face with the patient.   [facility] [address] [place], [place] [address] [phone]

## 2022-08-11 NOTE — Telephone Encounter (Signed)
Referral sent to [facility], phone # [phone]. 

## 2022-08-11 NOTE — Telephone Encounter (Signed)
PA for Ubrelvy 50 MG tablets has been submitted via [facility]. Awaiting determination from [place].  KEY: [id number]

## 2022-08-11 NOTE — Patient Instructions (Signed)
Stroke workup: -CT scan of the blood vessels in the head and neck -Ultrasound of the heart (echocardiogram) -Please send results of your blood work to our office -Continue Aspirin 81 mg daily  Headaches: -Start Emgality monthly injection for headache prevention -Start Gilbert as needed for migraines. Take one pill at onset of migraine, can repeat a dose in 2 hours if headaches persist. Max dose 2 pills in 24 hours  Memory: -Referral to neuropsychology for more extensive memory testing

## 2022-08-12 NOTE — Telephone Encounter (Signed)
Pt was APPROVED for 10 tablets for 30 days. Pharmacy notified via fax.

## 2022-08-13 ENCOUNTER — Telehealth: Payer: Self-pay | Admitting: Psychiatry

## 2022-08-13 NOTE — Telephone Encounter (Signed)
Referral faxed to [facility], phone # [phone].

## 2022-08-13 NOTE — Telephone Encounter (Signed)
[employer] Berkley Harvey: [id number] exp. 08/13/22-09/11/22 sent to [facility] [id number]

## 2022-08-13 NOTE — Telephone Encounter (Signed)
See note below

## 2022-08-13 NOTE — Telephone Encounter (Signed)
[facility] sent a fax that they are out of network with this patient's insurance so they would like to be referred somewhere else please.

## 2022-08-18 ENCOUNTER — Ambulatory Visit (HOSPITAL_COMMUNITY)
Admission: RE | Admit: 2022-08-18 | Discharge: 2022-08-18 | Disposition: A | Discharge status: Home / Self Care | Payer: BC Managed Care – PPO | Source: Ambulatory Visit | Attending: Psychiatry | Admitting: Psychiatry

## 2022-08-18 DIAGNOSIS — I639 Cerebral infarction, unspecified: Secondary | ICD-10-CM | POA: Diagnosis not present

## 2022-08-18 DIAGNOSIS — I6389 Other cerebral infarction: Secondary | ICD-10-CM | POA: Diagnosis not present

## 2022-08-18 DIAGNOSIS — E119 Type 2 diabetes mellitus without complications: Secondary | ICD-10-CM | POA: Insufficient documentation

## 2022-08-18 LAB — ECHOCARDIOGRAM COMPLETE BUBBLE STUDY
Area-P 1/2: 3.5 cm2
S' Lateral: 2.9 cm

## 2022-08-18 MED ORDER — PERFLUTREN LIPID MICROSPHERE
1.0000 mL | INTRAVENOUS | Status: AC | PRN
  Administered 2022-08-18: 1 mL via INTRAVENOUS

## 2022-08-19 ENCOUNTER — Telehealth: Payer: Self-pay | Admitting: Neurology

## 2022-08-19 NOTE — Telephone Encounter (Signed)
Called the patient and advised of the normal echo findings. Advised there was no cause for stroke found on echo. Pt verbalized understanding. Pt had no questions at this time but was encouraged to call back if questions arise.

## 2022-08-19 NOTE — Telephone Encounter (Signed)
-----   Message from Ocie Doyne, MD sent at 08/19/2022 10:53 AM EST ----- Echocardiogram is normal and does not show any cause for stroke

## 2022-08-24 ENCOUNTER — Other Ambulatory Visit: Payer: Self-pay | Admitting: Psychiatry

## 2022-08-24 ENCOUNTER — Telehealth: Payer: Self-pay | Admitting: Neurology

## 2022-08-24 ENCOUNTER — Telehealth: Payer: Self-pay | Admitting: Psychiatry

## 2022-08-24 MED ORDER — UBRELVY 100 MG PO TABS: 100.0000 mg | ORAL_TABLET | ORAL | 6 refills | Status: DC | PRN

## 2022-08-24 NOTE — Telephone Encounter (Signed)
Called the patient and advised that Dr Delena Bali would like to increase to 100 mg Bernita Raisin so hopefully that dose will knock it out and allow relief and may not need to repeat a 2nd dose. Pt verbalized understanding. Pt was appreciative for the call. Advised I will likely have to do a PA for the new dose.

## 2022-08-24 NOTE — Telephone Encounter (Signed)
Pt states he has taken all of his Malik Hill and his head is about to kill him.  Pt is asking for a call from RN.  Pt states he is not at a point of needing to go to the ED

## 2022-08-24 NOTE — Telephone Encounter (Signed)
Called the pt back. He states that woke up this morning with a pounding headache. He states that he has used all the ubrelvy for this month. The last one he took was today. He has found that taking 1 has not helped he typically needs the 2nd dose 2 hrs later and then it will ease up. He states that last week he went 4/5 days without a headache and today when he woke up it has just been hurting. The 1 ubrelvy that he had left today alone has not completely taken care of the headache. Tylenol doesn't touch it. Pt had emgality injections while he was here in the office. He has not tried nurtec or higher dose of ubrelvy. Advised I would ask Dr. Delena Bali what her thoughts are and reach back out. Confirmed the pharmacy on file is correct.

## 2022-08-24 NOTE — Telephone Encounter (Signed)
Yes, let's increase the Ubrelvy to 100 mg as needed. Hopefully this way he won't need to repeat a dose as frequently

## 2022-08-24 NOTE — Telephone Encounter (Signed)
Completed PA on [facility] [employer] [id number] Will await determination

## 2022-08-25 NOTE — Telephone Encounter (Signed)
PA approved for the patient until 08/25/2023 through [facility]

## 2022-08-31 ENCOUNTER — Ambulatory Visit (HOSPITAL_COMMUNITY): Payer: BC Managed Care – PPO

## 2022-08-31 ENCOUNTER — Ambulatory Visit (HOSPITAL_COMMUNITY)
Admission: RE | Admit: 2022-08-31 | Discharge: 2022-08-31 | Disposition: A | Discharge status: Home / Self Care | Payer: BC Managed Care – PPO | Source: Ambulatory Visit | Attending: Psychiatry | Admitting: Psychiatry

## 2022-08-31 ENCOUNTER — Encounter (HOSPITAL_COMMUNITY): Payer: Self-pay | Admitting: Radiology

## 2022-08-31 DIAGNOSIS — I639 Cerebral infarction, unspecified: Secondary | ICD-10-CM | POA: Insufficient documentation

## 2022-08-31 LAB — POCT I-STAT CREATININE: Creatinine, Ser: 1.5 mg/dL — ABNORMAL HIGH (ref 0.61–1.24)

## 2022-08-31 MED ORDER — IOHEXOL 350 MG/ML SOLN
75.0000 mL | Freq: Once | INTRAVENOUS | Status: AC | PRN
  Administered 2022-08-31: 75 mL via INTRAVENOUS

## 2022-09-01 ENCOUNTER — Ambulatory Visit: Payer: BC Managed Care – PPO | Admitting: Psychiatry

## 2022-09-01 ENCOUNTER — Telehealth: Payer: Self-pay

## 2022-09-01 NOTE — Telephone Encounter (Signed)
Contacted pt, informed him CTA of the head/neck shows only minimal atherosclerosis without any significant narrowing of the blood vessels in the head or neck. He asked what was atherosclerosis, informed him it is thickening or hardening of the arteries which can be caused by a buildup of plaque in the inner lining of an artery.  Advised no changes to POC at this time, keep FU in feb. Her verbally understood and was appreciative.

## 2022-09-01 NOTE — Telephone Encounter (Signed)
-----   Message from Ocie Doyne, MD sent at 09/01/2022  4:04 PM EST ----- CTA of the head/neck shows only minimal atherosclerosis without any significant narrowing of the blood vessels in the head or neck.

## 2022-10-28 ENCOUNTER — Encounter: Payer: Self-pay | Admitting: Urology

## 2022-10-28 ENCOUNTER — Ambulatory Visit: Payer: BC Managed Care – PPO | Admitting: Urology

## 2022-10-28 VITALS — BP 108/78 | HR 114

## 2022-10-28 DIAGNOSIS — R310 Gross hematuria: Secondary | ICD-10-CM

## 2022-10-28 DIAGNOSIS — N35011 Post-traumatic bulbous urethral stricture: Secondary | ICD-10-CM | POA: Diagnosis not present

## 2022-10-28 NOTE — Patient Instructions (Signed)

## 2022-10-28 NOTE — Progress Notes (Signed)
post void residual=61 

## 2022-10-28 NOTE — Progress Notes (Signed)
10/28/2022 3:53 PM   Malik Hill 04-Jan-1961 [id number]  Referring provider: Manon Hilding, MD [place],  [place] [address]  Gross hematuria  HPI: Mr Hoard is a 62yo here for evaluation of gross hematuria. He has a history urethral stricture with his last dilation was with Dr. Tresa Endo 10 years ago. He has had intermittent gross hematuria starting beginning of December. He was placed on cipro, vantin and bactrim.  UA today shows >30 RBCs and 3+ glucose. He had a renal US with Dr. Edythe Lynn office and was told he had a left renal calculus. IPSS 19 QOL 4. He has a fair stream and he has straining to urinate. Nocturia 2x. He denies dysuria.  He has a remote smoking history.   PMH: Past Medical History:  Diagnosis Date   Anxiety    BPH (benign prostatic hyperplasia)    Depression    Diabetes mellitus without complication (HCC)    Headache    Kidney stones    MDD (major depressive disorder)    Memory loss    Osteoarthritis     Surgical History: Past Surgical History:  Procedure Laterality Date   BUNIONECTOMY     COLONOSCOPY  06/09/2007   KNEE SURGERY Left 2015   PENILE ADHESIONS LYSIS     TONSILLECTOMY AND ADENOIDECTOMY     age 9    Home Medications:  Allergies as of 10/28/2022       Reactions   Penicillins Rash        Medication List        Accurate as of October 28, 2022  3:53 PM. If you have any questions, ask your nurse or doctor.          ALPRAZolam 1 MG tablet Commonly known as: XANAX Take 1 mg by mouth 4 (four) times daily as needed.   atenolol 25 MG tablet Commonly known as: TENORMIN Take by mouth daily.   cyanocobalamin 1000 MCG tablet Take 1 tablet (1,000 mcg total) by mouth daily.   Emgality 120 MG/ML Soaj Generic drug: Galcanezumab-gnlm Inject 120 mg into the skin every 30 (thirty) days.   glipiZIDE 10 MG 24 hr tablet Commonly known as: GLUCOTROL XL Take 10 mg by mouth 2 (two) times daily.   hydrochlorothiazide 12.5 MG  capsule Commonly known as: MICROZIDE Take 12.5 mg by mouth daily.   Jardiance 25 MG Tabs tablet Generic drug: empagliflozin Take 25 mg by mouth daily.   losartan 25 MG tablet Commonly known as: COZAAR Take 25 mg by mouth daily.   metFORMIN 500 MG 24 hr tablet Commonly known as: GLUCOPHAGE-XR TAKE 1 TABLET BY MOUTH DAILY   OLANZapine 5 MG tablet Commonly known as: ZYPREXA Take 5 mg by mouth daily.   ondansetron 8 MG disintegrating tablet Commonly known as: ZOFRAN-ODT ALLOW ONE TABLET TO DISSOLVE ON THE TONGUE EVERY 6 TO 8 HOURS PRN FOR NAUSEA OR VOMITING   pantoprazole 40 MG tablet Commonly known as: PROTONIX Take 40 mg by mouth 2 (two) times daily.   PARoxetine 30 MG tablet Commonly known as: PAXIL Take 60 mg by mouth daily.   predniSONE 20 MG tablet Commonly known as: DELTASONE Take 2 tablets by mouth daily x2 days; then 1 tablet by mouth daily x3 days; then half tablet by mouth daily x3 days and stop prednisone.   pyridoxine 100 MG tablet Commonly known as: B-6 Take 1 tablet (100 mg total) by mouth daily.   QUEtiapine 100 MG tablet Commonly known as:  SEROQUEL Take 100 mg by mouth at bedtime.   traMADol 50 MG tablet Commonly known as: ULTRAM Take 1 tablet (50 mg total) by mouth every 8 (eight) hours as needed for severe pain.   traZODone 100 MG tablet Commonly known as: DESYREL Take 100 mg by mouth daily.   Trulicity 1.5 HQ/4.6NG Sopn Generic drug: Dulaglutide Inject 1.5 mg into the skin once a week.   Ubrelvy 100 MG Tabs Generic drug: Ubrogepant Take 100 mg by mouth as needed. May repeat a dose in 2 hours if headache persists. Max dose 2 pills in 24 hours   Vitamin D (Ergocalciferol) 1.25 MG (50000 UNIT) Caps capsule Commonly known as: DRISDOL Take 1 capsule (50,000 Units total) by mouth every 7 (seven) days.        Allergies:  Allergies  Allergen Reactions   Penicillins Rash    Family History: Family History  Problem Relation Age of Onset    Hypertension Mother    Hypothyroidism Mother    Heart attack Father    Cancer Sister     Social History:  reports that he has quit smoking. His smoking use included cigarettes. He has never used smokeless tobacco. He reports that he does not drink alcohol and does not use drugs.  ROS: All other review of systems were reviewed and are negative except what is noted above in HPI  Physical Exam: BP 108/78   Pulse (!) 114   Constitutional:  Alert and oriented, No acute distress. HEENT: [place] AT, moist mucus membranes.  Trachea midline, no masses. Cardiovascular: No clubbing, cyanosis, or edema. Respiratory: Normal respiratory effort, no increased work of breathing. GI: Abdomen is soft, nontender, nondistended, no abdominal masses GU: No CVA tenderness. Circumcised phallus. No masses/lesions on penis, testis, scrotum. Prostate 30g smooth no nodules no induration.  Lymph: No cervical or inguinal lymphadenopathy. Skin: No rashes, bruises or suspicious lesions. Neurologic: Grossly intact, no focal deficits, moving all 4 extremities. Psychiatric: Normal mood and affect.  Laboratory Data: Lab Results  Component Value Date   WBC 3.4 (L) 09/29/2021   HGB 13.6 09/29/2021   HCT 41.5 09/29/2021   MCV 90.0 09/29/2021   PLT 101 (L) 09/29/2021    Lab Results  Component Value Date   CREATININE 1.50 (H) 08/31/2022    No results found for: "PSA"  No results found for: "TESTOSTERONE"  Lab Results  Component Value Date   HGBA1C 7.9 (H) 09/26/2021    Urinalysis    Component Value Date/Time   COLORURINE YELLOW 09/25/2021 1411   APPEARANCEUR CLEAR 09/25/2021 1411   LABSPEC 1.015 09/25/2021 1411   PHURINE 5.5 09/25/2021 1411   GLUCOSEU >=500 (A) 09/25/2021 1411   HGBUR LARGE (A) 09/25/2021 1411   BILIRUBINUR NEGATIVE 09/25/2021 1411   KETONESUR 15 (A) 09/25/2021 1411   PROTEINUR TRACE (A) 09/25/2021 1411   UROBILINOGEN 0.2 02/17/2008 1558   NITRITE NEGATIVE 09/25/2021 1411    LEUKOCYTESUR NEGATIVE 09/25/2021 1411    Lab Results  Component Value Date   BACTERIA RARE (A) 09/25/2021    Pertinent Imaging:  No results found for this or any previous visit.  No results found for this or any previous visit.  No results found for this or any previous visit.  No results found for this or any previous visit.  No results found for this or any previous visit.  No valid procedures specified. No results found for this or any previous visit.  No results found for this or any previous visit.  Assessment & Plan:    1. Gross hematuria CT hematuria Urine cytology Office cystoscopy  2. Post-traumatic bulbous urethral stricture We will schedule for office cystoscopy   No follow-ups on file.  Wilkie Aye, MD  [facility] [place]

## 2022-10-30 LAB — CYTOLOGY, URINE

## 2022-11-03 NOTE — Progress Notes (Signed)
Letter sent.

## 2022-11-11 NOTE — Progress Notes (Unsigned)
   CC:  headaches, memory loss  Follow-up Visit  Last visit: 08/11/22  Brief HPI: 62 year old male with a history of depression, anxiety, HTN, DM, CVA, and kidney stones who follows in clinic for headaches and memory loss. 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  At his last visit, CTA head/neck and TTE were ordered. He was referred to neuropsychology for memory evaluation. Emgality was started for migraine prevention and Roselyn Meier was started for rescue.  Interval History: Headaches***Emgality***Ubrelvy***  CTA head/neck did not show any hemodynamically significant stenosis. TTE showed EF of 60-65% with no evidence of shunt. Blood work was never received from his PCP's office***Aspirin***  Memory*** Neuropsych***  Headache days per month: *** Migraine days per month*** Headache free days per month: ***  Current Headache Regimen: Preventative: *** Abortive: ***   Prior Therapies                                  ***  Physical Exam:   Vital Signs: There were no vitals taken for this visit. GENERAL:  well appearing, in no acute distress, alert  SKIN:  Color, texture, turgor normal. No rashes or lesions HEAD:  Normocephalic/atraumatic. RESP: normal respiratory effort MSK:  No gross joint deformities.   NEUROLOGICAL: Mental Status: Alert, oriented to person, place and time, Follows commands, and Speech fluent and appropriate. Cranial Nerves: PERRL, face symmetric, no dysarthria, hearing grossly intact Motor: moves all extremities equally Gait: normal-based.  IMPRESSION: ***  PLAN: *** -Lipid panel*** -consider donepezil***  Follow-up: ***  I spent a total of *** minutes on the date of the service. Headache education was done. Discussed lifestyle modification including increased oral hydration, decreased caffeine, exercise and stress management. Discussed treatment options including preventive and acute  medications, natural supplements, and infusion therapy. Discussed medication overuse headache and to limit use of acute treatments to no more than 2 days/week or 10 days/month. Discussed medication side effects, adverse reactions and drug interactions. Written educational materials and patient instructions outlining all of the above were given.  Genia Harold, MD

## 2022-11-12 ENCOUNTER — Telehealth: Payer: Self-pay | Admitting: Psychiatry

## 2022-11-12 ENCOUNTER — Ambulatory Visit: Payer: BC Managed Care – PPO | Admitting: Psychiatry

## 2022-11-12 VITALS — BP 103/76 | HR 102 | Ht 76.0 in | Wt 262.8 lb

## 2022-11-12 DIAGNOSIS — G43709 Chronic migraine without aura, not intractable, without status migrainosus: Secondary | ICD-10-CM | POA: Diagnosis not present

## 2022-11-12 DIAGNOSIS — I639 Cerebral infarction, unspecified: Secondary | ICD-10-CM | POA: Diagnosis not present

## 2022-11-12 DIAGNOSIS — R413 Other amnesia: Secondary | ICD-10-CM | POA: Diagnosis not present

## 2022-11-12 MED ORDER — UBRELVY 100 MG PO TABS
100.0000 mg | ORAL_TABLET | ORAL | 6 refills | Status: DC | PRN
Start: 2022-11-12 — End: 2022-11-17

## 2022-11-12 MED ORDER — EMGALITY 120 MG/ML ~~LOC~~ SOAJ: 120.0000 mg | SUBCUTANEOUS | 6 refills | Status: DC

## 2022-11-12 MED ORDER — DONEPEZIL HCL 10 MG PO TABS: 10.0000 mg | ORAL_TABLET | Freq: Every day | ORAL | 6 refills | Status: DC

## 2022-11-12 MED ORDER — NURTEC 75 MG PO TBDP: 75.0000 mg | ORAL_TABLET | ORAL | 0 refills | Status: DC | PRN

## 2022-11-12 MED ORDER — DONEPEZIL HCL 5 MG PO TABS: 5.0000 mg | ORAL_TABLET | Freq: Every day | ORAL | 0 refills | Status: DC

## 2022-11-12 NOTE — Patient Instructions (Signed)
Donepezil We recommended that you take Donepezil (Aricept) 5mg  tab once a day for the first 4 weeks, then increase the dose to 10mg  once per day. It is better to take Donepezil with breakfast or lunch.   Referral to neuropsychology for more extensive memory testing  Try Nurtec as needed for migraines. Take one pill at onset of migraine. Let me know if this is effective and I will send in a prescription for you.  Continue Emgality monthly  Please have recent blood work faxed from PCP to our office

## 2022-11-12 NOTE — Telephone Encounter (Signed)
Referral for Neuropsychology sent through [facility] to [facility]. Phone: [phone], Fax: [phone]

## 2022-11-16 ENCOUNTER — Encounter: Payer: Self-pay | Admitting: Psychology

## 2022-11-17 ENCOUNTER — Telehealth: Payer: Self-pay | Admitting: Psychiatry

## 2022-11-17 MED ORDER — NURTEC 75 MG PO TBDP: 75.0000 mg | ORAL_TABLET | ORAL | 6 refills | Status: DC | PRN

## 2022-11-17 NOTE — Telephone Encounter (Signed)
Pt called stating that he was able to take the Nurtec and is wanting to know if the provider can send in a Rx to the [facility] in [place] for him. Please advise.

## 2022-11-17 NOTE — Telephone Encounter (Signed)
Dr,Chima please see the below, per note 11/12/22 "Nurtec sample provided, will send in rx if this is more effective "

## 2022-11-17 NOTE — Telephone Encounter (Signed)
Rx for nurtec sent to his pharmacy

## 2022-11-17 NOTE — Telephone Encounter (Signed)
Please call pt to let them know Dr. Billey Gosling sent in a prescription for Nurtec as requested. Thank you

## 2022-12-04 ENCOUNTER — Ambulatory Visit (HOSPITAL_COMMUNITY)
Admission: RE | Admit: 2022-12-04 | Discharge: 2022-12-04 | Disposition: A | Discharge status: Home / Self Care | Payer: BC Managed Care – PPO | Source: Ambulatory Visit | Attending: Urology | Admitting: Urology

## 2022-12-04 ENCOUNTER — Encounter: Payer: Self-pay | Admitting: Urology

## 2022-12-04 DIAGNOSIS — R310 Gross hematuria: Secondary | ICD-10-CM

## 2022-12-04 LAB — POCT I-STAT CREATININE: Creatinine, Ser: 1.4 mg/dL — ABNORMAL HIGH (ref 0.61–1.24)

## 2022-12-04 MED ORDER — IOHEXOL 300 MG/ML  SOLN: 125.0000 mL | Freq: Once | INTRAMUSCULAR | Status: DC | PRN

## 2022-12-04 MED ORDER — IOHEXOL 350 MG/ML SOLN
100.0000 mL | Freq: Once | INTRAVENOUS | Status: AC | PRN
  Administered 2022-12-04: 100 mL via INTRAVENOUS

## 2022-12-07 ENCOUNTER — Other Ambulatory Visit: Payer: Self-pay | Admitting: Psychiatry

## 2022-12-07 ENCOUNTER — Encounter: Payer: Self-pay | Admitting: Psychiatry

## 2022-12-07 MED ORDER — ATORVASTATIN CALCIUM 40 MG PO TABS: 40.0000 mg | ORAL_TABLET | Freq: Every day | ORAL | 8 refills | Status: DC

## 2022-12-14 ENCOUNTER — Ambulatory Visit: Payer: BC Managed Care – PPO | Admitting: Urology

## 2022-12-14 VITALS — BP 102/71 | HR 109

## 2022-12-14 DIAGNOSIS — R9341 Abnormal radiologic findings on diagnostic imaging of renal pelvis, ureter, or bladder: Secondary | ICD-10-CM | POA: Diagnosis not present

## 2022-12-14 DIAGNOSIS — R310 Gross hematuria: Secondary | ICD-10-CM

## 2022-12-14 NOTE — Progress Notes (Unsigned)
12/14/2022 2:08 PM   Malik Hill Apr 02, 1961 [id number]  Referring provider: Manon Hilding, MD [place],  [place] [address]  No chief complaint on file.   HPI:    PMH: Past Medical History:  Diagnosis Date   Anxiety    BPH (benign prostatic hyperplasia)    Depression    Diabetes mellitus without complication (HCC)    Headache    Kidney stones    MDD (major depressive disorder)    Memory loss    Osteoarthritis     Surgical History: Past Surgical History:  Procedure Laterality Date   BUNIONECTOMY     COLONOSCOPY  06/09/2007   KNEE SURGERY Left 2015   PENILE ADHESIONS LYSIS     TONSILLECTOMY AND ADENOIDECTOMY     age 62    Home Medications:  Allergies as of 12/14/2022       Reactions   Penicillins Rash        Medication List        Accurate as of December 14, 2022  2:08 PM. If you have any questions, ask your nurse or doctor.          ALPRAZolam 1 MG tablet Commonly known as: XANAX Take 1 mg by mouth 4 (four) times daily as needed.   atenolol 25 MG tablet Commonly known as: TENORMIN Take by mouth daily.   atorvastatin 40 MG tablet Commonly known as: Lipitor Take 1 tablet (40 mg total) by mouth daily.   cyanocobalamin 1000 MCG tablet Take 1 tablet (1,000 mcg total) by mouth daily.   donepezil 5 MG tablet Commonly known as: ARICEPT Take 1 tablet (5 mg total) by mouth daily. Take for 4 weeks, then increase to 10 mg daily   donepezil 10 MG tablet Commonly known as: ARICEPT Take 1 tablet (10 mg total) by mouth daily.   Emgality 120 MG/ML Soaj Generic drug: Galcanezumab-gnlm Inject 120 mg into the skin every 30 (thirty) days.   glipiZIDE 10 MG 24 hr tablet Commonly known as: GLUCOTROL XL Take 10 mg by mouth 2 (two) times daily.   hydrochlorothiazide 12.5 MG capsule Commonly known as: MICROZIDE Take 12.5 mg by mouth daily.   Jardiance 25 MG Tabs tablet Generic drug: empagliflozin Take 25 mg by mouth daily.   losartan 25 MG  tablet Commonly known as: COZAAR Take 25 mg by mouth daily.   Nurtec 75 MG Tbdp Generic drug: Rimegepant Sulfate Take 1 tablet (75 mg total) by mouth as needed (migraine). Max dose 1 pill in 24 hours   ondansetron 8 MG disintegrating tablet Commonly known as: ZOFRAN-ODT   pantoprazole 40 MG tablet Commonly known as: PROTONIX Take 40 mg by mouth 2 (two) times daily.   PARoxetine 30 MG tablet Commonly known as: PAXIL Take 60 mg by mouth daily.   pyridoxine 100 MG tablet Commonly known as: B-6 Take 1 tablet (100 mg total) by mouth daily.   QUEtiapine 100 MG tablet Commonly known as: SEROQUEL Take 100 mg by mouth at bedtime.   traZODone 100 MG tablet Commonly known as: DESYREL Take 100 mg by mouth daily.   Trulicity 1.5 0000000 Sopn Generic drug: Dulaglutide Inject 1.5 mg into the skin once a week.   Vitamin D (Ergocalciferol) 1.25 MG (50000 UNIT) Caps capsule Commonly known as: DRISDOL Take 1 capsule (50,000 Units total) by mouth every 7 (seven) days.        Allergies:  Allergies  Allergen Reactions   Penicillins Rash    Family  History: Family History  Problem Relation Age of Onset   Hypertension Mother    Hypothyroidism Mother    Heart attack Father    Cancer Sister     Social History:  reports that he has quit smoking. His smoking use included cigarettes. He has never used smokeless tobacco. He reports that he does not drink alcohol and does not use drugs.  ROS: All other review of systems were reviewed and are negative except what is noted above in HPI  Physical Exam: BP 102/71   Pulse (!) 109   Constitutional:  Alert and oriented, No acute distress. HEENT: [place] AT, moist mucus membranes.  Trachea midline, no masses. Cardiovascular: No clubbing, cyanosis, or edema. Respiratory: Normal respiratory effort, no increased work of breathing. GI: Abdomen is soft, nontender, nondistended, no abdominal masses GU: No CVA tenderness.  Lymph: No cervical or  inguinal lymphadenopathy. Skin: No rashes, bruises or suspicious lesions. Neurologic: Grossly intact, no focal deficits, moving all 4 extremities. Psychiatric: Normal mood and affect.  Laboratory Data: Lab Results  Component Value Date   WBC 3.4 (L) 09/29/2021   HGB 13.6 09/29/2021   HCT 41.5 09/29/2021   MCV 90.0 09/29/2021   PLT 101 (L) 09/29/2021    Lab Results  Component Value Date   CREATININE 1.40 (H) 12/04/2022    No results found for: "PSA"  No results found for: "TESTOSTERONE"  Lab Results  Component Value Date   HGBA1C 7.9 (H) 09/26/2021    Urinalysis    Component Value Date/Time   COLORURINE YELLOW 09/25/2021 1411   APPEARANCEUR CLEAR 09/25/2021 1411   LABSPEC 1.015 09/25/2021 1411   PHURINE 5.5 09/25/2021 1411   GLUCOSEU >=500 (A) 09/25/2021 1411   HGBUR LARGE (A) 09/25/2021 1411   BILIRUBINUR NEGATIVE 09/25/2021 1411   KETONESUR 15 (A) 09/25/2021 1411   PROTEINUR TRACE (A) 09/25/2021 1411   UROBILINOGEN 0.2 02/17/2008 1558   NITRITE NEGATIVE 09/25/2021 1411   LEUKOCYTESUR NEGATIVE 09/25/2021 1411    Lab Results  Component Value Date   BACTERIA RARE (A) 09/25/2021    Pertinent Imaging: *** No results found for this or any previous visit.  No results found for this or any previous visit.  No results found for this or any previous visit.  No results found for this or any previous visit.  No results found for this or any previous visit.  No valid procedures specified. Results for orders placed during the hospital encounter of 12/04/22  CT HEMATURIA WORKUP  Narrative CLINICAL DATA:  Gross hematuria * Tracking Code: BO *  EXAM: CT ABDOMEN AND PELVIS WITHOUT AND WITH CONTRAST  TECHNIQUE: Multidetector CT imaging of the abdomen and pelvis was performed following the standard protocol before and following the bolus administration of intravenous contrast.  RADIATION DOSE REDUCTION: This exam was performed according to the departmental  dose-optimization program which includes automated exposure control, adjustment of the mA and/or kV according to patient size and/or use of iterative reconstruction technique.  CONTRAST:  150m OMNIPAQUE IOHEXOL 350 MG/ML SOLN  COMPARISON:  02/13/2008  FINDINGS: Lower chest: No acute abnormality.  Hepatobiliary: No solid liver abnormality is seen. Coarse, nodular contour of the liver. No gallstones, gallbladder wall thickening, or biliary dilatation.  Pancreas: Unremarkable. No pancreatic ductal dilatation or surrounding inflammatory changes.  Spleen: Splenomegaly, maximum coronal span 16.5 cm.  Adrenals/Urinary Tract: Adrenal glands are unremarkable. Numerous left renal calculi. Heterogeneous, expansile and faintly calcified appearance of the left renal pelvis filling defect measuring 4.9 x 2.9 x  1.8 cm (series 10, image 64, series 7, image 52). The right kidney is normal, without renal calculi, solid lesion, or hydronephrosis. Mild wall thickening of the urinary bladder.  Stomach/Bowel: Stomach is within normal limits. Diverticulum of the descending duodenum. Appendix appears normal. No evidence of bowel wall thickening, distention, or inflammatory changes. Descending and sigmoid diverticulosis.  Vascular/Lymphatic: No significant vascular findings are present. No enlarged abdominal or pelvic lymph nodes.  Reproductive: No mass or other significant abnormality.  Other: No abdominal wall hernia or abnormality. No ascites.  Musculoskeletal: No acute or significant osseous findings.  IMPRESSION: 1. Numerous left renal calculi. 2. Heterogeneous, expansile and faintly calcified appearance of the left renal pelvis filling defect measuring 4.9 x 2.9 x 1.8 cm. This may reflect a large, partially calcified staghorn calculus but appearance is somewhat concerning for urothelial malignancy. 3. No evidence of lymphadenopathy or metastatic disease in the abdomen or pelvis. 4.  Mild wall thickening of the urinary bladder, likely related to chronic outlet obstruction 5. Cirrhotic morphology of the liver.  Splenomegaly. 6. Descending and sigmoid diverticulosis without evidence of acute diverticulitis.  These results will be called to the ordering clinician or representative by the Radiologist Assistant, and communication documented in the PACS or [employer].   Electronically Signed By: Delanna Ahmadi M.D. On: 12/05/2022 17:04  No results found for this or any previous visit.   Assessment & Plan:    1. Left renal mass/left renal calculus -We discussed the  - Urinalysis, Routine w reflex microscopic   No follow-ups on file.  Nicolette Bang, MD  [facility] [place]

## 2022-12-15 LAB — URINALYSIS, ROUTINE W REFLEX MICROSCOPIC
Bilirubin, UA: NEGATIVE
Ketones, UA: NEGATIVE
Leukocytes,UA: NEGATIVE
Nitrite, UA: NEGATIVE
Protein,UA: NEGATIVE
Specific Gravity, UA: 1.01 (ref 1.005–1.030)
Urobilinogen, Ur: 1 mg/dL (ref 0.2–1.0)
pH, UA: 5.5 (ref 5.0–7.5)

## 2022-12-15 LAB — MICROSCOPIC EXAMINATION: Bacteria, UA: NONE SEEN

## 2022-12-22 ENCOUNTER — Telehealth: Payer: Self-pay | Admitting: Psychiatry

## 2022-12-22 ENCOUNTER — Other Ambulatory Visit: Payer: Self-pay | Admitting: *Deleted

## 2022-12-22 DIAGNOSIS — R413 Other amnesia: Secondary | ICD-10-CM

## 2022-12-22 NOTE — Telephone Encounter (Signed)
I called patient and discussed the below. Pt said he only took donepezil 10 mg tablet for 2 days and he had very scary nightmares so he stopped the medication. When taking the 5 mg dose he did not have nightmares. Pt said he wants to take medication because he is worried about his memory. He would like your thoughts on how he should proceed.  Please advise

## 2022-12-22 NOTE — Telephone Encounter (Signed)
Pt said, taking donepezil (ARICEPT) 10 MG tablet began having nightmares. Stop taking on 12/18/22 and nightmares stopped. Would like a call from the nurse.

## 2022-12-23 NOTE — Telephone Encounter (Signed)
Referral for psychology resent to [facility]. Phone: [phone], Fax: [phone].  Pt requested referral sent to another facility due to needing a sooner for disability deadline.

## 2022-12-24 NOTE — Telephone Encounter (Signed)
He can try taking the donepezil in the morning if he isn't already. Donepezil is more likely to cause vivid dreams when taken at night. If he still has nightmares with taking it in the morning, then he can drop back down to the 5 mg dose daily

## 2022-12-24 NOTE — Telephone Encounter (Signed)
Called and spoke with pt. Relayed Dr. Georgina Peer recommendation. He will try to switch to 10mg  po q am. If he continues to have nightmares, he will drop to 5mg  po q am instead.

## 2022-12-28 ENCOUNTER — Telehealth: Payer: Self-pay

## 2022-12-28 NOTE — Telephone Encounter (Signed)
Patient states that he took his Trulicity on Saturday [id number].  Preop informed patient he was not able to do his surgery, he will have to d/c rx 8 days prior to surgery.

## 2022-12-29 ENCOUNTER — Encounter (HOSPITAL_COMMUNITY): Payer: Self-pay | Admitting: Anesthesiology

## 2022-12-29 NOTE — Telephone Encounter (Signed)
My chart message sent to confirm new surgery date and surgery instructions

## 2022-12-30 ENCOUNTER — Encounter (HOSPITAL_COMMUNITY): Admission: RE | Admit: 2022-12-30 | Payer: BC Managed Care – PPO | Source: Ambulatory Visit

## 2022-12-31 ENCOUNTER — Ambulatory Visit (HOSPITAL_COMMUNITY): Admission: RE | Admit: 2022-12-31 | Payer: BC Managed Care – PPO | Source: Ambulatory Visit | Admitting: Urology

## 2022-12-31 ENCOUNTER — Encounter (HOSPITAL_COMMUNITY): Admission: RE | Payer: Self-pay | Source: Ambulatory Visit

## 2022-12-31 SURGERY — CYSTOSCOPY, WITH RETROGRADE PYELOGRAM AND URETERAL STENT INSERTION
Anesthesia: General | Laterality: Left

## 2023-01-08 IMAGING — MR MR THORACIC SPINE W/O CM
9 of 11 series · 36 of 48 positions shown · non-contrast
Comparison: None

CLINICAL DATA: Back pain. Rule out fracture. Rule out epidural
abscess.

EXAM:
MRI THORACIC AND LUMBAR SPINE WITHOUT CONTRAST
TECHNIQUE: Multiplanar and multiecho pulse sequences of the thoracic and lumbar
spine were obtained without intravenous contrast.

[Series 16: T1 · sagittal · 5.0mm · 1.95mm/px · 3 of 11 slices shown (1 of 4)]
[im 1/11]
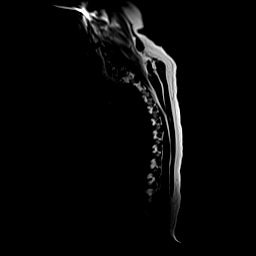
[im 6/11]
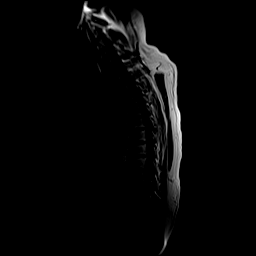
[im 11/11]
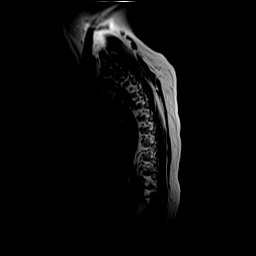

[Series 17: T2 · sagittal · 3.0mm · 1.25mm/px · 4 of 17 slices shown (1 of 4)]
[im 1/17]
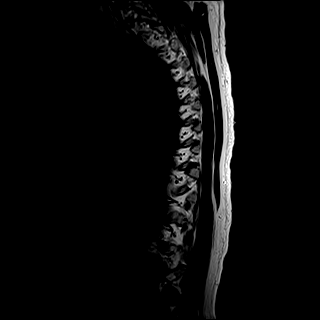
[im 6/17]
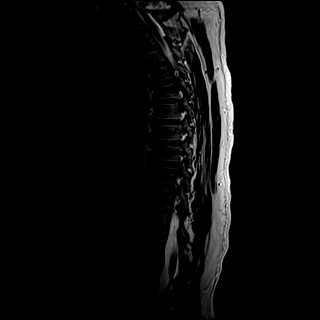
[im 11/17]
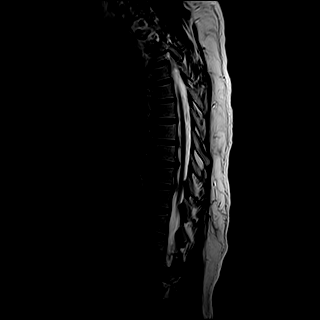
[im 17/17]
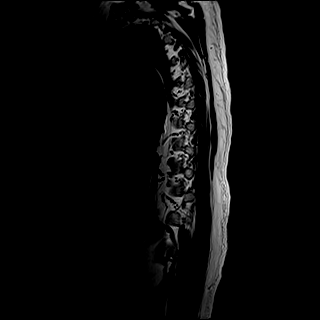

[Series 18: T1 · sagittal · 3.0mm · 1.56mm/px · 3 of 17 slices shown (2 of 4)]
[im 1/17]
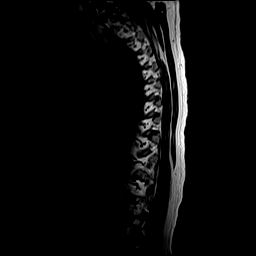
[im 9/17]
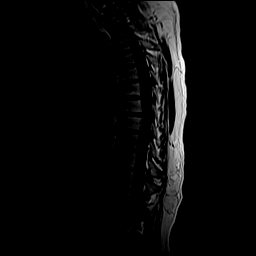
[im 17/17]
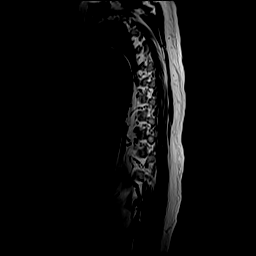

[Series 19: STIR · sagittal · 3.0mm · 0.53mm/px · 1 of 17 slices shown]
[im 1/17]
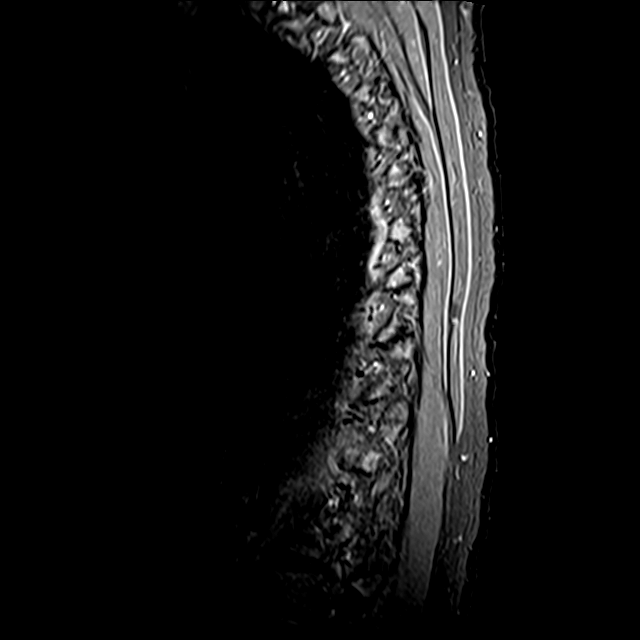

[Series 20: T2 · axial · 5.0mm · 0.74mm/px · z∈[-351,-99]mm · 7 of 39 slices shown (2 of 4)]
[im 1/39]
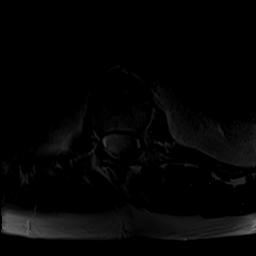
[im 7/39]
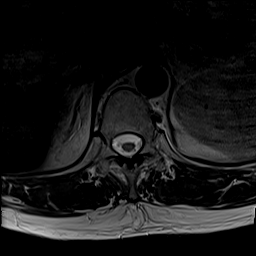
[im 13/39]
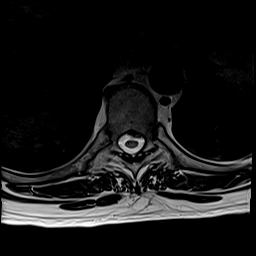
[im 20/39]
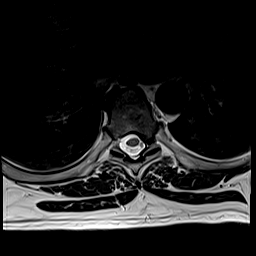
[im 26/39]
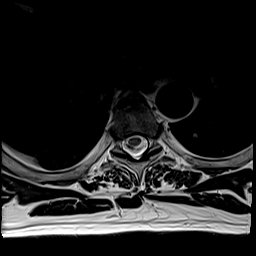
[im 32/39]
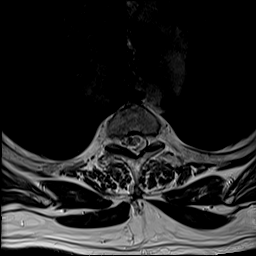
[im 39/39]
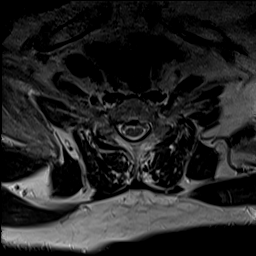

[Series 22: T2 · sagittal · 4.0mm · 0.94mm/px · 3 of 15 slices shown (3 of 4)]
[im 1/15]
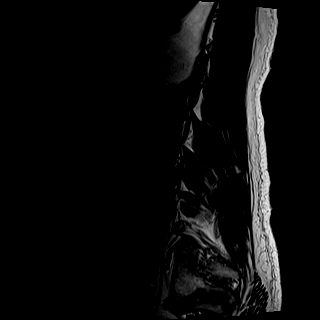
[im 8/15]
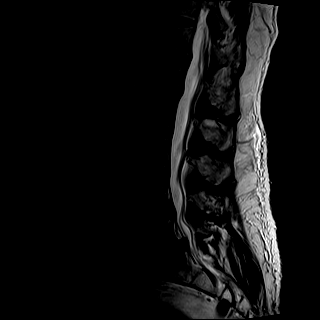
[im 15/15]
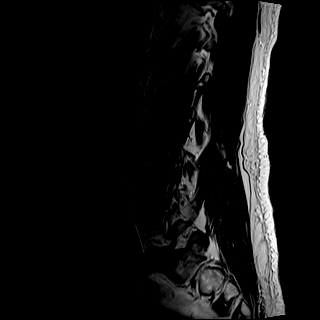

[Series 24: T1 · sagittal · 4.0mm · 1.17mm/px · 3 of 15 slices shown (3 of 4)]
[im 1/15]
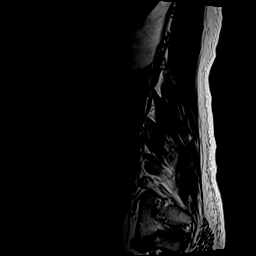
[im 8/15]
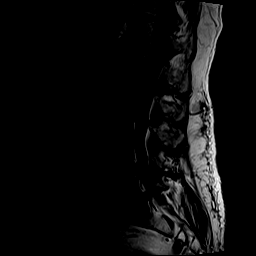
[im 15/15]
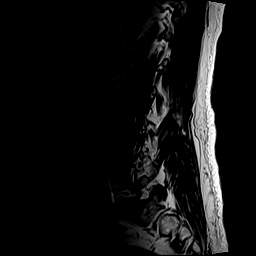

[Series 25: T2 · axial · 4.0mm · 0.70mm/px · z∈[-577,-355]mm · 6 of 34 slices shown (4 of 4)]
[im 1/34]
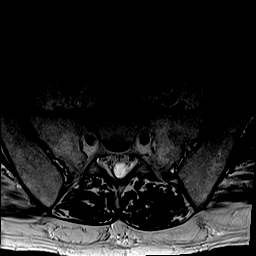
[im 7/34]
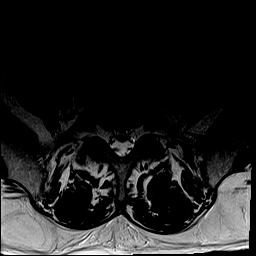
[im 14/34]
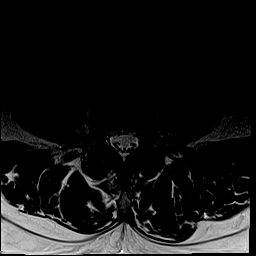
[im 20/34]
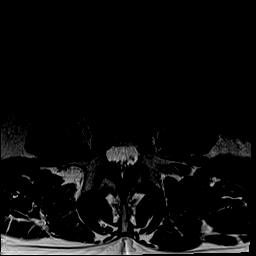
[im 27/34]
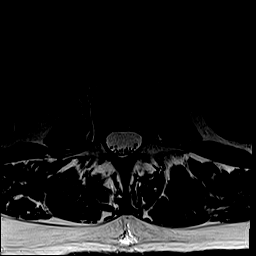
[im 34/34]
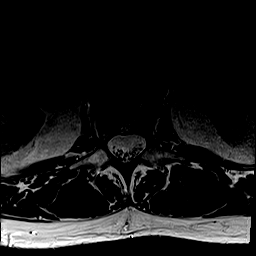

[Series 26: T1 · axial · 4.0mm · 0.35mm/px · z∈[-577,-355]mm · 6 of 34 slices shown (4 of 4)]
[im 1/34]
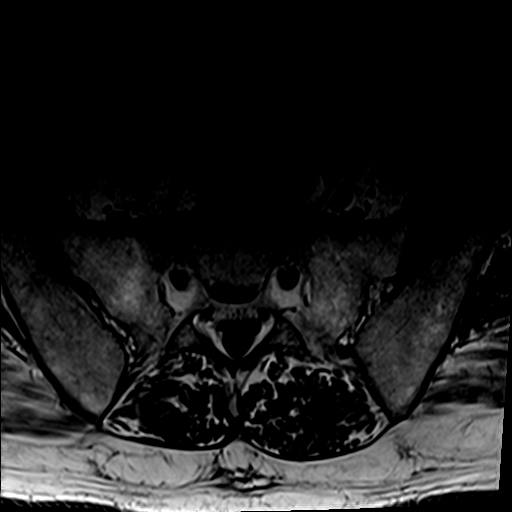
[im 7/34]
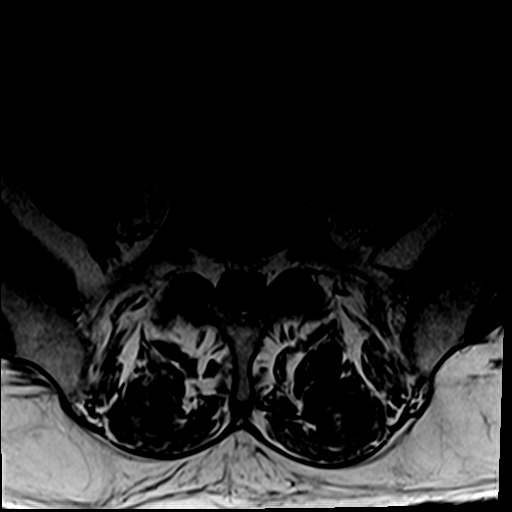
[im 14/34]
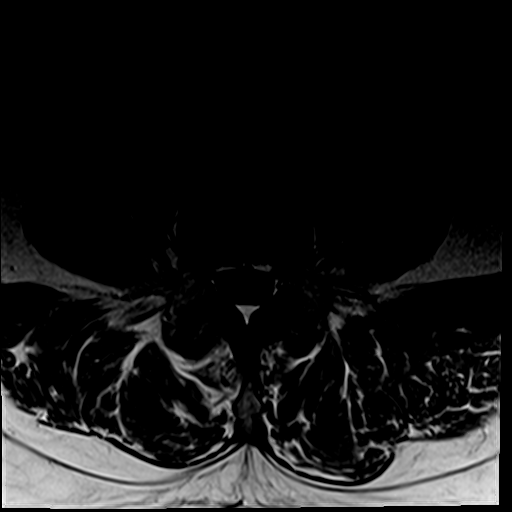
[im 20/34]
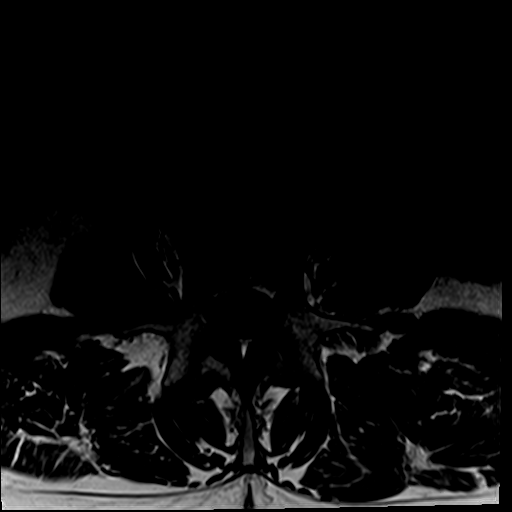
[im 27/34]
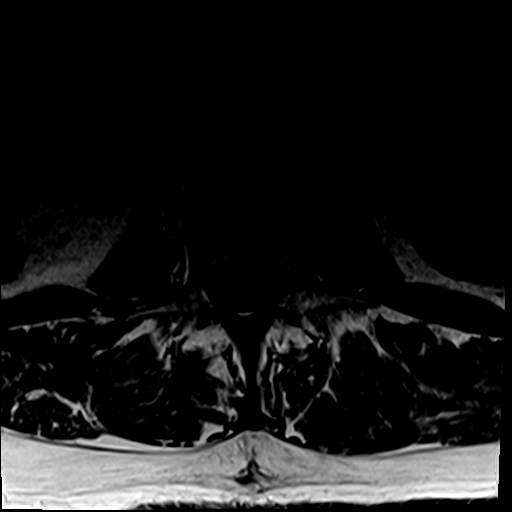
[im 34/34]
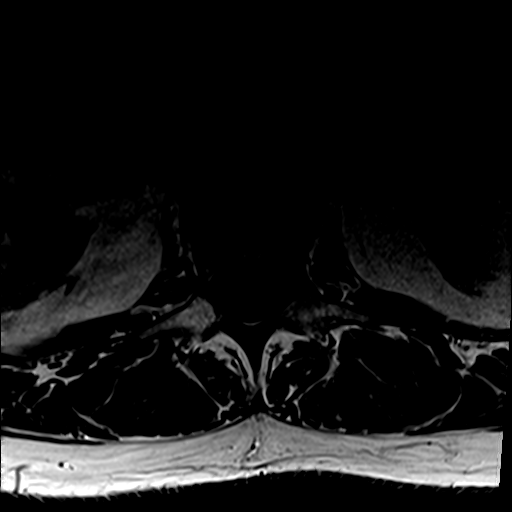

[36 of 48 positions shown; findings below may reference images not displayed]

FINDINGS: MRI THORACIC SPINE FINDINGS

Alignment:  Normal

Vertebrae: Negative for fracture, mass, or spinal infection

Cord: Cord deformity due to disc protrusions. Spinal cord signal
normal throughout.

Paraspinal and other soft tissues: Negative for paraspinous mass or
adenopathy. No pleural effusion.

Disc levels:

T1-2: Central disc protrusion without stenosis

T2-3: Negative

T3-4: Mild disc degeneration.  Negative for stenosis.

T4-5: Negative

T5-6: Shallow central disc protrusion with cord flattening. No
significant stenosis

T6-7: Left-sided disc protrusion with mild left foraminal narrowing.

T7-8: Moderately large right paracentral disc protrusion and
spurring with moderate cord flattening ventrally on the right.
Adequate CSF posterior to the cord

T8-9: Mild to moderate left-sided disc protrusion without
significant stenosis

T9-10: Large extruded disc fragment in the midline. There is cord
flattening with moderate spinal stenosis. Cord signal normal.

T10-11: Negative

T11-12: Negative

MRI LUMBAR SPINE FINDINGS

Segmentation:  5 lumbar segments

Alignment:  Normal

Vertebrae: Mild chronic compression fracture L1. No acute fracture
or mass. Negative for spinal infection

Conus medullaris and cauda equina: Conus extends to the L1 level.
Conus and cauda equina appear normal.

Paraspinal and other soft tissues: Negative for paraspinous mass or
adenopathy.

Disc levels:

L1-2: Mild disc and facet degeneration.  Negative for stenosis

L2-3: Negative

L3-4: Mild disc and mild facet degeneration. Shallow left foraminal
disc protrusion. Mild left foraminal narrowing. Central canal
patent.

L4-5: Disc bulging and small central disc protrusion. Bilateral
facet degeneration. No significant stenosis.

L5-S1: Moderately large central disc protrusion. L5 nerve roots exit
freely. Disc protrusion is touching the S1 nerve roots bilaterally
without compression.
IMPRESSION: MR THORACIC SPINE IMPRESSION

1. Thoracic multilevel disc degeneration. Multi level disc
protrusions. Largest disc protrusion is at T9-10 causing cord
flattening and moderate spinal stenosis.
2. Negative for thoracic infection

MR LUMBAR SPINE IMPRESSION

1. Negative for lumbar infection.  Chronic fracture L1
2. Shallow left foraminal disc protrusion L3-4. Central disc
protrusion L5-S1 touching the S1 nerve roots but not causing nerve
root compression.

## 2023-01-14 NOTE — Patient Instructions (Signed)
Thurston Sheets  01/14/2023     @PREFPERIOPPHARMACY @   Your procedure is scheduled on  01/21/2023.   Report to [facility] at  1000 A.M.   Call this number if you have problems the morning of surgery:  [phone]  If you experience any cold or flu symptoms such as cough, fever, chills, shortness of breath, etc. between now and your scheduled surgery, please notify us at the above number.   Remember:  Do not eat or drink after midnight.        Your last dose of jardiance should be on 01/17/2023.     Take these medicines the morning of surgery with A SIP OF WATER         xanax(if needed), atenolol, zyrtec, aricept, omeprazole, paxil, nurtec, trazadone.     Do not wear jewelry, make-up or nail polish.  Do not wear lotions, powders, or perfumes, or deodorant.  Do not shave 48 hours prior to surgery.  Men may shave face and neck.  Do not bring valuables to the hospital.  [facility] is not responsible for any belongings or valuables.  Contacts, dentures or bridgework may not be worn into surgery.  Leave your suitcase in the car.  After surgery it may be brought to your room.  For patients admitted to the hospital, discharge time will be determined by your treatment team.  Patients discharged the day of surgery will not be allowed to drive home and must have someone with them for 24 hours.    Special instructions:   DO NOT smoke tobacco or vape for 24 hours before your procedure.  Please read over the following fact sheets that you were given. Pain Booklet, Surgical Site Infection Prevention, Anesthesia Post-op Instructions, and Care and Recovery After Surgery      Ureteral Stent Implantation, Care After The following information offers guidance on how to care for yourself after your procedure. Your health care provider may also give you more specific instructions. If you have problems or questions, contact your health care provider. What can I expect after the  procedure? After the procedure, it is common to have: Nausea. Mild pain when you urinate. You may feel this pain in your lower back or lower abdomen. The pain should stop within a few minutes after you urinate. This pattern may last for up to 1 week. A small amount of blood in your urine for several days. Follow these instructions at home: Medicines Take over-the-counter and prescription medicines only as told by your health care provider. If you were prescribed antibiotics, take them as told by your health care provider. Do not stop using the antibiotic even if you start to feel better. If you were given a sedative during the procedure, it can affect you for several hours. Do not drive or operate machinery until your health care provider says that it is safe. Ask your health care provider if the medicine prescribed to you: Requires you to avoid driving or using machinery. Can cause constipation. You may need to take these actions to prevent or treat constipation: Take over-the-counter or prescription medicines. Eat foods that are high in fiber, such as beans, whole grains, and fresh fruits and vegetables. Limit foods that are high in fat and processed sugars, such as fried or sweet foods. Activity Rest as told by your health care provider. Do not sit for a long time without moving. Get up to take short walks every 1-2 hours. This will  improve blood flow and breathing. Ask for help if you feel weak or unsteady. Return to your normal activities as told by your health care provider. Ask your health care provider what activities are safe for you. General instructions  If you have a catheter: Follow instructions from your health care provider about taking care of your catheter and collection bag. Do not Drink enough fluid to keep your urine pale yellow. Do not use any products that contain nicotine or tobacco. These products include cigarettes, chewing tobacco, and vaping devices, such as  e-cigarettes. These can delay healing after surgery. If you need help quitting, ask your health care provider. Keep all follow-up visits. Contact a health care provider if: You start passing blood clots, or you have more than a small amount of blood in your urine. You have pain that gets worse or does not get better with medicine, especially pain when you urinate. You have trouble urinating. You feel nauseous or you vomit again and again during a period of more than 2 days after the procedure. You have a fever. Get help right away if: You are passing blood clots that are 1 inch (2.5 cm) or larger in size. You are leaking urine (have incontinence), or you cannot urinate. The end of the stent comes out of your urethra. You have sudden, sharp, or severe pain in your abdomen or lower back. You have swelling or pain in your legs. You have trouble breathing. These symptoms may be an emergency. Get help right away. Call 911. Do not wait to see if the symptoms will go away. Do not drive yourself to the hospital. Summary After the procedure, it is common to have mild pain when you urinate that goes away within a few minutes after you urinate. This may last for up to 1 week. Take over-the-counter and prescription medicines only as told by your health care provider. Drink enough fluid to keep your urine pale yellow. Call your health care provider if you start passing blood clots, or you have more than a small amount of blood in your urine. This information is not intended to replace advice given to you by your health care provider. Make sure you discuss any questions you have with your health care provider. Document Revised: 10/27/2021 Document Reviewed: 10/27/2021 Elsevier Patient Education  2023 [employer] General Anesthesia, Adult, Care After The following information offers guidance on how to care for yourself after your procedure. Your health care provider may also give you more specific  instructions. If you have problems or questions, contact your health care provider. What can I expect after the procedure? After the procedure, it is common for people to: Have pain or discomfort at the IV site. Have nausea or vomiting. Have a sore throat or hoarseness. Have trouble concentrating. Feel cold or chills. Feel weak, sleepy, or tired (fatigue). Have soreness and body aches. These can affect parts of the body that were not involved in surgery. Follow these instructions at home: For the time period you were told by your health care provider:  Rest. Do not participate in activities where you could fall or become injured. Do not drive or use machinery. Do not drink alcohol. Do not take sleeping pills or medicines that cause drowsiness. Do not make important decisions or sign legal documents. Do not take care of children on your own. General instructions Drink enough fluid to keep your urine pale yellow. If you have sleep apnea, surgery and certain medicines can increase your risk  for breathing problems. Follow instructions from your health care provider about wearing your sleep device: Anytime you are sleeping, including during daytime naps. While taking prescription pain medicines, sleeping medicines, or medicines that make you drowsy. Return to your normal activities as told by your health care provider. Ask your health care provider what activities are safe for you. Take over-the-counter and prescription medicines only as told by your health care provider. Do not use any products that contain nicotine or tobacco. These products include cigarettes, chewing tobacco, and vaping devices, such as e-cigarettes. These can delay incision healing after surgery. If you need help quitting, ask your health care provider. Contact a health care provider if: You have nausea or vomiting that does not get better with medicine. You vomit every time you eat or drink. You have pain that does  not get better with medicine. You cannot urinate or have bloody urine. You develop a skin rash. You have a fever. Get help right away if: You have trouble breathing. You have chest pain. You vomit blood. These symptoms may be an emergency. Get help right away. Call 911. Do not wait to see if the symptoms will go away. Do not drive yourself to the hospital. Summary After the procedure, it is common to have a sore throat, hoarseness, nausea, vomiting, or to feel weak, sleepy, or fatigue. For the time period you were told by your health care provider, do not drive or use machinery. Get help right away if you have difficulty breathing, have chest pain, or vomit blood. These symptoms may be an emergency. This information is not intended to replace advice given to you by your health care provider. Make sure you discuss any questions you have with your health care provider. Document Revised: 12/19/2021 Document Reviewed: 12/19/2021 Elsevier Patient Education  2023 [employer]

## 2023-01-18 ENCOUNTER — Encounter (HOSPITAL_COMMUNITY): Payer: Self-pay

## 2023-01-18 ENCOUNTER — Encounter (HOSPITAL_COMMUNITY)
Admission: RE | Admit: 2023-01-18 | Discharge: 2023-01-18 | Disposition: A | Discharge status: Home / Self Care | Payer: BC Managed Care – PPO | Source: Ambulatory Visit | Attending: Urology | Admitting: Urology

## 2023-01-18 VITALS — BP 108/71 | HR 69 | Temp 98.4°F | Resp 18 | Ht 76.0 in | Wt 262.8 lb

## 2023-01-18 DIAGNOSIS — Z7985 Long-term (current) use of injectable non-insulin antidiabetic drugs: Secondary | ICD-10-CM | POA: Diagnosis not present

## 2023-01-18 DIAGNOSIS — I69919 Unspecified symptoms and signs involving cognitive functions following unspecified cerebrovascular disease: Secondary | ICD-10-CM | POA: Diagnosis not present

## 2023-01-18 DIAGNOSIS — E119 Type 2 diabetes mellitus without complications: Secondary | ICD-10-CM | POA: Diagnosis not present

## 2023-01-18 DIAGNOSIS — Z7984 Long term (current) use of oral hypoglycemic drugs: Secondary | ICD-10-CM | POA: Diagnosis not present

## 2023-01-18 DIAGNOSIS — I10 Essential (primary) hypertension: Secondary | ICD-10-CM

## 2023-01-18 DIAGNOSIS — E139 Other specified diabetes mellitus without complications: Secondary | ICD-10-CM | POA: Insufficient documentation

## 2023-01-18 DIAGNOSIS — Z87891 Personal history of nicotine dependence: Secondary | ICD-10-CM | POA: Diagnosis not present

## 2023-01-18 DIAGNOSIS — Z01818 Encounter for other preprocedural examination: Secondary | ICD-10-CM | POA: Insufficient documentation

## 2023-01-18 DIAGNOSIS — N132 Hydronephrosis with renal and ureteral calculous obstruction: Secondary | ICD-10-CM | POA: Diagnosis not present

## 2023-01-18 DIAGNOSIS — N35919 Unspecified urethral stricture, male, unspecified site: Secondary | ICD-10-CM | POA: Diagnosis not present

## 2023-01-18 DIAGNOSIS — F418 Other specified anxiety disorders: Secondary | ICD-10-CM | POA: Diagnosis not present

## 2023-01-18 LAB — HEMOGLOBIN A1C
Hgb A1c MFr Bld: 7 % — ABNORMAL HIGH (ref 4.8–5.6)
Mean Plasma Glucose: 154.2 mg/dL

## 2023-01-18 LAB — BASIC METABOLIC PANEL
Anion gap: 6 (ref 5–15)
BUN: 13 mg/dL (ref 8–23)
CO2: 23 mmol/L (ref 22–32)
Calcium: 8.7 mg/dL — ABNORMAL LOW (ref 8.9–10.3)
Chloride: 105 mmol/L (ref 98–111)
Creatinine, Ser: 1.19 mg/dL (ref 0.61–1.24)
GFR, Estimated: >60 mL/min (ref >60)
Glucose, Bld: 240 mg/dL — ABNORMAL HIGH (ref 70–99)
Potassium: 3.7 mmol/L (ref 3.5–5.1)
Sodium: 134 mmol/L — ABNORMAL LOW (ref 135–145)

## 2023-01-20 DIAGNOSIS — Z0271 Encounter for disability determination: Secondary | ICD-10-CM

## 2023-01-21 ENCOUNTER — Other Ambulatory Visit: Payer: Self-pay

## 2023-01-21 ENCOUNTER — Encounter (HOSPITAL_COMMUNITY): Payer: Self-pay | Admitting: Urology

## 2023-01-21 ENCOUNTER — Ambulatory Visit (HOSPITAL_COMMUNITY)
Admission: RE | Admit: 2023-01-21 | Discharge: 2023-01-21 | Disposition: A | Discharge status: Home / Self Care | Payer: BC Managed Care – PPO | Attending: Urology | Admitting: Urology

## 2023-01-21 ENCOUNTER — Ambulatory Visit (HOSPITAL_COMMUNITY): Payer: BC Managed Care – PPO | Admitting: Anesthesiology

## 2023-01-21 ENCOUNTER — Encounter (HOSPITAL_COMMUNITY)
Admission: RE | Disposition: A | Discharge status: Home / Self Care | Payer: Self-pay | Source: Home / Self Care | Attending: Urology

## 2023-01-21 ENCOUNTER — Ambulatory Visit (HOSPITAL_COMMUNITY): Payer: BC Managed Care – PPO

## 2023-01-21 DIAGNOSIS — N132 Hydronephrosis with renal and ureteral calculous obstruction: Secondary | ICD-10-CM | POA: Diagnosis not present

## 2023-01-21 DIAGNOSIS — N135 Crossing vessel and stricture of ureter without hydronephrosis: Secondary | ICD-10-CM | POA: Diagnosis not present

## 2023-01-21 DIAGNOSIS — N35919 Unspecified urethral stricture, male, unspecified site: Secondary | ICD-10-CM | POA: Insufficient documentation

## 2023-01-21 DIAGNOSIS — Z87891 Personal history of nicotine dependence: Secondary | ICD-10-CM | POA: Insufficient documentation

## 2023-01-21 DIAGNOSIS — Z7985 Long-term (current) use of injectable non-insulin antidiabetic drugs: Secondary | ICD-10-CM | POA: Insufficient documentation

## 2023-01-21 DIAGNOSIS — E119 Type 2 diabetes mellitus without complications: Secondary | ICD-10-CM | POA: Insufficient documentation

## 2023-01-21 DIAGNOSIS — N200 Calculus of kidney: Secondary | ICD-10-CM

## 2023-01-21 DIAGNOSIS — N2889 Other specified disorders of kidney and ureter: Secondary | ICD-10-CM

## 2023-01-21 DIAGNOSIS — F418 Other specified anxiety disorders: Secondary | ICD-10-CM | POA: Insufficient documentation

## 2023-01-21 DIAGNOSIS — Z7984 Long term (current) use of oral hypoglycemic drugs: Secondary | ICD-10-CM | POA: Insufficient documentation

## 2023-01-21 DIAGNOSIS — I10 Essential (primary) hypertension: Secondary | ICD-10-CM | POA: Insufficient documentation

## 2023-01-21 DIAGNOSIS — I69919 Unspecified symptoms and signs involving cognitive functions following unspecified cerebrovascular disease: Secondary | ICD-10-CM | POA: Insufficient documentation

## 2023-01-21 HISTORY — PX: CYSTOSCOPY WITH RETROGRADE PYELOGRAM, URETEROSCOPY AND STENT PLACEMENT: SHX5789

## 2023-01-21 LAB — GLUCOSE, CAPILLARY: Glucose-Capillary: 151 mg/dL — ABNORMAL HIGH (ref 70–99)

## 2023-01-21 SURGERY — CYSTOURETEROSCOPY, WITH RETROGRADE PYELOGRAM AND STENT INSERTION
Anesthesia: General | Site: Penis

## 2023-01-21 MED ORDER — PHENAZOPYRIDINE HCL 100 MG PO TABS: 100.0000 mg | ORAL_TABLET | Freq: Three times a day (TID) | ORAL | 0 refills | Status: DC | PRN

## 2023-01-21 MED ORDER — MIDAZOLAM HCL 5 MG/5ML IJ SOLN
INTRAMUSCULAR | Status: DC | PRN
  Administered 2023-01-21: 2 mg via INTRAVENOUS

## 2023-01-21 MED ORDER — DIATRIZOATE MEGLUMINE 30 % UR SOLN
URETHRAL | Status: AC
  Filled 2023-01-21: qty 100

## 2023-01-21 MED ORDER — HYDROMORPHONE HCL 1 MG/ML IJ SOLN: 0.2500 mg | INTRAMUSCULAR | Status: DC | PRN

## 2023-01-21 MED ORDER — ONDANSETRON HCL 4 MG PO TABS: 4.0000 mg | ORAL_TABLET | Freq: Every day | ORAL | 1 refills | Status: DC | PRN

## 2023-01-21 MED ORDER — MIDAZOLAM HCL 2 MG/2ML IJ SOLN
INTRAMUSCULAR | Status: AC
  Filled 2023-01-21: qty 2

## 2023-01-21 MED ORDER — ONDANSETRON HCL 4 MG/2ML IJ SOLN
INTRAMUSCULAR | Status: AC
  Filled 2023-01-21: qty 2

## 2023-01-21 MED ORDER — PROPOFOL 10 MG/ML IV BOLUS
INTRAVENOUS | Status: AC
  Filled 2023-01-21: qty 20

## 2023-01-21 MED ORDER — FENTANYL CITRATE (PF) 100 MCG/2ML IJ SOLN
INTRAMUSCULAR | Status: DC | PRN
  Administered 2023-01-21: 50 ug via INTRAVENOUS
  Administered 2023-01-21 (×2): 25 ug via INTRAVENOUS

## 2023-01-21 MED ORDER — ORAL CARE MOUTH RINSE: 15.0000 mL | Freq: Once | OROMUCOSAL | Status: AC

## 2023-01-21 MED ORDER — DIATRIZOATE MEGLUMINE 30 % UR SOLN
URETHRAL | Status: DC | PRN
  Administered 2023-01-21: 10 mL via URETHRAL

## 2023-01-21 MED ORDER — CHLORHEXIDINE GLUCONATE 0.12 % MT SOLN
15.0000 mL | Freq: Once | OROMUCOSAL | Status: AC
  Administered 2023-01-21: 15 mL via OROMUCOSAL

## 2023-01-21 MED ORDER — SODIUM BICARBONATE 650 MG PO TABS: 650.0000 mg | ORAL_TABLET | Freq: Two times a day (BID) | ORAL | 11 refills | Status: DC

## 2023-01-21 MED ORDER — SODIUM CHLORIDE 0.9 % IR SOLN
Status: DC | PRN
  Administered 2023-01-21 (×2): 3000 mL

## 2023-01-21 MED ORDER — PHENYLEPHRINE 80 MCG/ML (10ML) SYRINGE FOR IV PUSH (FOR BLOOD PRESSURE SUPPORT)
PREFILLED_SYRINGE | INTRAVENOUS | Status: AC
  Filled 2023-01-21: qty 10

## 2023-01-21 MED ORDER — ONDANSETRON HCL 4 MG/2ML IJ SOLN: 4.0000 mg | Freq: Once | INTRAMUSCULAR | Status: DC | PRN

## 2023-01-21 MED ORDER — PROPOFOL 10 MG/ML IV BOLUS
INTRAVENOUS | Status: DC | PRN
  Administered 2023-01-21: 170 mg via INTRAVENOUS

## 2023-01-21 MED ORDER — MEPERIDINE HCL 50 MG/ML IJ SOLN: 6.2500 mg | INTRAMUSCULAR | Status: DC | PRN

## 2023-01-21 MED ORDER — FENTANYL CITRATE (PF) 100 MCG/2ML IJ SOLN
INTRAMUSCULAR | Status: AC
  Filled 2023-01-21: qty 2

## 2023-01-21 MED ORDER — WATER FOR IRRIGATION, STERILE IR SOLN
Status: DC | PRN
  Administered 2023-01-21: 500 mL

## 2023-01-21 MED ORDER — PHENYLEPHRINE 80 MCG/ML (10ML) SYRINGE FOR IV PUSH (FOR BLOOD PRESSURE SUPPORT)
PREFILLED_SYRINGE | INTRAVENOUS | Status: DC | PRN
  Administered 2023-01-21: 160 ug via INTRAVENOUS

## 2023-01-21 MED ORDER — CEFAZOLIN SODIUM-DEXTROSE 2-4 GM/100ML-% IV SOLN
2.0000 g | INTRAVENOUS | Status: AC
  Administered 2023-01-21: 2 g via INTRAVENOUS
  Filled 2023-01-21: qty 100

## 2023-01-21 MED ORDER — LACTATED RINGERS IV SOLN: INTRAVENOUS | Status: DC

## 2023-01-21 MED ORDER — LIDOCAINE HCL (CARDIAC) PF 50 MG/5ML IV SOSY
PREFILLED_SYRINGE | INTRAVENOUS | Status: DC | PRN
  Administered 2023-01-21: 100 mg via INTRAVENOUS

## 2023-01-21 MED ORDER — ONDANSETRON HCL 4 MG/2ML IJ SOLN
INTRAMUSCULAR | Status: DC | PRN
  Administered 2023-01-21: 4 mg via INTRAVENOUS

## 2023-01-21 MED ORDER — OXYCODONE-ACETAMINOPHEN 5-325 MG PO TABS: 1.0000 | ORAL_TABLET | ORAL | 0 refills | Status: DC | PRN

## 2023-01-21 SURGICAL SUPPLY — 28 items
BAG DRAIN URO TABLE W/ADPT NS (BAG) ×3 IMPLANT
BAG DRN 8 ADPR NS SKTRN CSTL (BAG) ×3
BAG HAMPER (MISCELLANEOUS) ×3 IMPLANT
CATH FOLEY 2W 5CC 18FR LF (CATHETERS) ×1 IMPLANT
CATH INTERMIT  6FR 70CM (CATHETERS) ×3 IMPLANT
CLOTH BEACON ORANGE TIMEOUT ST (SAFETY) ×3 IMPLANT
DECANTER SPIKE VIAL GLASS SM (MISCELLANEOUS) ×3 IMPLANT
EXTRACTOR STONE NITINOL NGAGE (UROLOGICAL SUPPLIES) IMPLANT
GLOVE BIO SURGEON STRL SZ8 (GLOVE) ×3 IMPLANT
GLOVE BIOGEL PI IND STRL 7.0 (GLOVE) ×6 IMPLANT
GOWN STRL REUS W/TWL LRG LVL3 (GOWN DISPOSABLE) ×3 IMPLANT
GOWN STRL REUS W/TWL XL LVL3 (GOWN DISPOSABLE) ×3 IMPLANT
GUIDEWIRE STR DUAL SENSOR (WIRE) ×3 IMPLANT
GUIDEWIRE STR ZIPWIRE 035X150 (MISCELLANEOUS) ×3 IMPLANT
IV NS IRRIG 3000ML ARTHROMATIC (IV SOLUTION) ×6 IMPLANT
KIT TURNOVER CYSTO (KITS) ×3 IMPLANT
MANIFOLD NEPTUNE II (INSTRUMENTS) ×3 IMPLANT
PACK CYSTO (CUSTOM PROCEDURE TRAY) ×3 IMPLANT
PAD ARMBOARD 7.5X6 YLW CONV (MISCELLANEOUS) ×3 IMPLANT
SHEATH NAVIGATOR HD 11/13X36 (SHEATH) ×1 IMPLANT
SHEATH URETERAL 12FRX35CM (MISCELLANEOUS) IMPLANT
STENT URET 6FRX26 CONTOUR (STENTS) ×1 IMPLANT
SYR 10ML LL (SYRINGE) ×3 IMPLANT
SYR CONTROL 10ML LL (SYRINGE) ×3 IMPLANT
TOWEL OR 17X26 4PK STRL BLUE (TOWEL DISPOSABLE) ×3 IMPLANT
TRACTIP FLEXIVA PULS ID 200XHI (Laser) IMPLANT
TRACTIP FLEXIVA PULSE ID 200 (Laser)
WATER STERILE IRR 500ML POUR (IV SOLUTION) ×3 IMPLANT

## 2023-01-21 NOTE — Op Note (Signed)
.  Preoperative diagnosis: Left renal pelvis tumor  Postoperative diagnosis: Left UPJ obstruction, large left renal calculus, urethral stricture  Procedure: 1 cystoscopy 2. Left retrograde pyelography 3.  Intraoperative fluoroscopy, under one hour, with interpretation 4.  Left diagnostic ureteroscopy 5.  Left 6 x 26 JJ stent placement 6. Urethral dilation  Attending: Cleda Mccreedy  Anesthesia: General  Estimated blood loss: None  Drains: Left 6 x 26 JJ ureteral stent without tether 18 french foley  Specimens: none  Antibiotics: ancef  Findings: left UPJ obstruction with moderate to severe left hydronephrosis. Large left renal pelvis calculus. No tumor visualized. Dense penile urethral stricture No masses/lesions in the bladder. Ureteral orifices in normal anatomic location.  Indications: Patient is a 62 year old male with a history of left renal mass versus calculus. After discussing treatment options, he decided proceed with left diagnostic ureteroscopy.  Procedure in detail: The patient was brought to the operating room and a brief timeout was done to ensure correct patient, correct procedure, correct site.  General anesthesia was administered patient was placed in dorsal lithotomy position.  His genitalia was then prepped and draped in usual sterile fashion.  A rigid 22 French cystoscope was passed into the penile urethra were we encountered a dense pinhole urethral stricture. We advanced a sensor wire past the stricture and curled it into the bladder. We then used sequential dilators to dilate the urethral from 6 french to 28 french. We then passed the scope in to the bladder.  Bladder was inspected free masses or lesions.  the ureteral orifices were in the normal orthotopic locations.  a 6 french ureteral catheter was then instilled into the left ureteral orifice.  a gentle retrograde was obtained and findings noted above.  we then placed a zip wire through the ureteral catheter  and advanced up to the renal pelvis.  we then removed the cystoscope and cannulated the left ureteral orifice with a semirigid ureteroscope.  No stone was found in the ureter. Once we reached the UPJ a sensor wire was advanced in to the renal pelvis. We then removed the ureteroscope and advanced am 12/14 x 38cm access sheath up to the UPJ. We then used the flexible ureteroscope to perform nephroscopy.  The scope could not be passed through the UPJ. We could visual a large calculus in the renal pelvis and no tumor was visualized.  We then elected to place a stent. we removed the access sheath under direct vision and noted no injury to the ureter. We then placed a 6 x 26 double-j ureteral stent over the original zip wire.  We then removed the wire and good coil was noted in the the renal pelvis under fluoroscopy and the bladder under direct vision. the bladder was then drained and this concluded the procedure which was well tolerated by patient.  Complications: None  Condition: Stable, extubated, transferred to PACU  Plan: Patient is to be discharged home as to follow-up in 2 weeks for left ureteroscopic stone extraction

## 2023-01-21 NOTE — Transfer of Care (Signed)
Immediate Anesthesia Transfer of Care Note  Patient: Malik Hill  Procedure(s) Performed: CYSTOSCOPY WITH RETROGRADE PYELOGRAM, URETEROSCOPY AND STENT PLACEMENT (Left: Penis) URETHRAL DILATION UNDER ANESTHESIA (Penis)  Patient Location: PACU  Anesthesia Type:General  Level of Consciousness: drowsy  Airway & Oxygen Therapy: Patient Spontanous Breathing and Patient connected to face mask oxygen  Post-op Assessment: Report given to RN and Post -op Vital signs reviewed and stable  Post vital signs: Reviewed and stable  Last Vitals:  Vitals Value Taken Time  BP    Temp    Pulse 64 01/21/23 1302  Resp    SpO2 97 % 01/21/23 1302  Vitals shown include unvalidated device data.  Last Pain:  Vitals:   01/21/23 1027  TempSrc: Oral  PainSc: 0-No pain         Complications: No notable events documented.

## 2023-01-21 NOTE — H&P (Signed)
HPI: Malik Hill is a 61yo here for followup for gross hematuria. He continues to have gross hematuria with clots. CT hematuria protocol shows a 4cm left renal pelvis mass versus calcification. He denies nay left flank pain. He has chronic right flank pain.      PMH:     Past Medical History:  Diagnosis Date   Anxiety     BPH (benign prostatic hyperplasia)     Depression     Diabetes mellitus without complication (HCC)     Headache     Kidney stones     MDD (major depressive disorder)     Memory loss     Osteoarthritis        Surgical History:      Past Surgical History:  Procedure Laterality Date   BUNIONECTOMY       COLONOSCOPY   06/09/2007   KNEE SURGERY Left 2015   PENILE ADHESIONS LYSIS       TONSILLECTOMY AND ADENOIDECTOMY        age 85      Home Medications:  Allergies as of 12/14/2022         Reactions    Penicillins Rash            Medication List           Accurate as of December 14, 2022  2:08 PM. If you have any questions, ask your nurse or doctor.              ALPRAZolam 1 MG tablet Commonly known as: XANAX Take 1 mg by mouth 4 (four) times daily as needed.    atenolol 25 MG tablet Commonly known as: TENORMIN Take by mouth daily.    atorvastatin 40 MG tablet Commonly known as: Lipitor Take 1 tablet (40 mg total) by mouth daily.    cyanocobalamin 1000 MCG tablet Take 1 tablet (1,000 mcg total) by mouth daily.    donepezil 5 MG tablet Commonly known as: ARICEPT Take 1 tablet (5 mg total) by mouth daily. Take for 4 weeks, then increase to 10 mg daily    donepezil 10 MG tablet Commonly known as: ARICEPT Take 1 tablet (10 mg total) by mouth daily.    Emgality 120 MG/ML Soaj Generic drug: Galcanezumab-gnlm Inject 120 mg into the skin every 30 (thirty) days.    glipiZIDE 10 MG 24 hr tablet Commonly known as: GLUCOTROL XL Take 10 mg by mouth 2 (two) times daily.    hydrochlorothiazide 12.5 MG capsule Commonly known as: MICROZIDE Take  12.5 mg by mouth daily.    Jardiance 25 MG Tabs tablet Generic drug: empagliflozin Take 25 mg by mouth daily.    losartan 25 MG tablet Commonly known as: COZAAR Take 25 mg by mouth daily.    Nurtec 75 MG Tbdp Generic drug: Rimegepant Sulfate Take 1 tablet (75 mg total) by mouth as needed (migraine). Max dose 1 pill in 24 hours    ondansetron 8 MG disintegrating tablet Commonly known as: ZOFRAN-ODT    pantoprazole 40 MG tablet Commonly known as: PROTONIX Take 40 mg by mouth 2 (two) times daily.    PARoxetine 30 MG tablet Commonly known as: PAXIL Take 60 mg by mouth daily.    pyridoxine 100 MG tablet Commonly known as: B-6 Take 1 tablet (100 mg total) by mouth daily.    QUEtiapine 100 MG tablet Commonly known as: SEROQUEL Take 100 mg by mouth at bedtime.    traZODone 100 MG tablet Commonly known as:  DESYREL Take 100 mg by mouth daily.    Trulicity 1.5 MG/0.5ML Sopn Generic drug: Dulaglutide Inject 1.5 mg into the skin once a week.    Vitamin D (Ergocalciferol) 1.25 MG (50000 UNIT) Caps capsule Commonly known as: DRISDOL Take 1 capsule (50,000 Units total) by mouth every 7 (seven) days.             Allergies:      Allergies  Allergen Reactions   Penicillins Rash      Family History:      Family History  Problem Relation Age of Onset   Hypertension Mother     Hypothyroidism Mother     Heart attack Father     Cancer Sister        Social History:  reports that he has quit smoking. His smoking use included cigarettes. He has never used smokeless tobacco. He reports that he does not drink alcohol and does not use drugs.   ROS: All other review of systems were reviewed and are negative except what is noted above in HPI   Physical Exam: BP 102/71   Pulse (!) 109   Constitutional:  Alert and oriented, No acute distress. HEENT: [place] AT, moist mucus membranes.  Trachea midline, no masses. Cardiovascular: No clubbing, cyanosis, or edema. Respiratory:  Normal respiratory effort, no increased work of breathing. GI: Abdomen is soft, nontender, nondistended, no abdominal masses GU: No CVA tenderness.  Lymph: No cervical or inguinal lymphadenopathy. Skin: No rashes, bruises or suspicious lesions. Neurologic: Grossly intact, no focal deficits, moving all 4 extremities. Psychiatric: Normal mood and affect.   Laboratory Data: Recent Labs       Lab Results  Component Value Date    WBC 3.4 (L) 09/29/2021    HGB 13.6 09/29/2021    HCT 41.5 09/29/2021    MCV 90.0 09/29/2021    PLT 101 (L) 09/29/2021        Recent Labs       Lab Results  Component Value Date    CREATININE 1.40 (H) 12/04/2022        Recent Labs  No results found for: "PSA"     Recent Labs  No results found for: "TESTOSTERONE"     Recent Labs       Lab Results  Component Value Date    HGBA1C 7.9 (H) 09/26/2021        Urinalysis Labs (Brief)          Component Value Date/Time    COLORURINE YELLOW 09/25/2021 1411    APPEARANCEUR CLEAR 09/25/2021 1411    LABSPEC 1.015 09/25/2021 1411    PHURINE 5.5 09/25/2021 1411    GLUCOSEU >=500 (A) 09/25/2021 1411    HGBUR LARGE (A) 09/25/2021 1411    BILIRUBINUR NEGATIVE 09/25/2021 1411    KETONESUR 15 (A) 09/25/2021 1411    PROTEINUR TRACE (A) 09/25/2021 1411    UROBILINOGEN 0.2 02/17/2008 1558    NITRITE NEGATIVE 09/25/2021 1411    LEUKOCYTESUR NEGATIVE 09/25/2021 1411        Recent Labs       Lab Results  Component Value Date    BACTERIA RARE (A) 09/25/2021        Pertinent Imaging: Ct 12/04/2022: Images reviewed and discussed with the patient  No results found for this or any previous visit.   No results found for this or any previous visit.   No results found for this or any previous visit.   No results found for this or any  previous visit.   No results found for this or any previous visit.   No valid procedures specified. Results for orders placed during the hospital encounter of  12/04/22   CT HEMATURIA WORKUP   Narrative CLINICAL DATA:  Gross hematuria * Tracking Code: BO *   EXAM: CT ABDOMEN AND PELVIS WITHOUT AND WITH CONTRAST   TECHNIQUE: Multidetector CT imaging of the abdomen and pelvis was performed following the standard protocol before and following the bolus administration of intravenous contrast.   RADIATION DOSE REDUCTION: This exam was performed according to the departmental dose-optimization program which includes automated exposure control, adjustment of the mA and/or kV according to patient size and/or use of iterative reconstruction technique.   CONTRAST:  OMNIPAQUE IOHEXOL 350 MG/ML SOLN   COMPARISON:  02/13/2008   FINDINGS: Lower chest: No acute abnormality.   Hepatobiliary: No solid liver abnormality is seen. Coarse, nodular contour of the liver. No gallstones, gallbladder wall thickening, or biliary dilatation.   Pancreas: Unremarkable. No pancreatic ductal dilatation or surrounding inflammatory changes.   Spleen: Splenomegaly, maximum coronal span 16.5 cm.   Adrenals/Urinary Tract: Adrenal glands are unremarkable. Numerous left renal calculi. Heterogeneous, expansile and faintly calcified appearance of the left renal pelvis filling defect measuring 4.9 x 2.9 x 1.8 cm (series 10, image 64, series 7, image 52). The right kidney is normal, without renal calculi, solid lesion, or hydronephrosis. Mild wall thickening of the urinary bladder.   Stomach/Bowel: Stomach is within normal limits. Diverticulum of the descending duodenum. Appendix appears normal. No evidence of bowel wall thickening, distention, or inflammatory changes. Descending and sigmoid diverticulosis.   Vascular/Lymphatic: No significant vascular findings are present. No enlarged abdominal or pelvic lymph nodes.   Reproductive: No mass or other significant abnormality.   Other: No abdominal wall hernia or abnormality. No ascites.   Musculoskeletal:  No acute or significant osseous findings.   IMPRESSION: 1. Numerous left renal calculi. 2. Heterogeneous, expansile and faintly calcified appearance of the left renal pelvis filling defect measuring 4.9 x 2.9 x 1.8 cm. This may reflect a large, partially calcified staghorn calculus but appearance is somewhat concerning for urothelial malignancy. 3. No evidence of lymphadenopathy or metastatic disease in the abdomen or pelvis. 4. Mild wall thickening of the urinary bladder, likely related to chronic outlet obstruction 5. Cirrhotic morphology of the liver.  Splenomegaly. 6. Descending and sigmoid diverticulosis without evidence of acute diverticulitis.   These results will be called to the ordering clinician or representative by the Radiologist Assistant, and communication documented in the PACS or [employer].     Electronically Signed By: Jearld Lesch M.D. On: 12/05/2022 17:04   No results found for this or any previous visit.     Assessment & Plan:     1. Left renal mass/left renal calculus -We discussed the management of left ureteral masses/stone including left diagnostic ureteroscopy with possible ureteral biopsy versus ureteroscopic stone extraction and after discussing the procedure the patient wishes to proceed with surgery. Risks/benefits/alternatives discussed - Urinalysis, Routine w reflex microscopic     No follow-ups on file.   Wilkie Aye, MD   [facility] [place]

## 2023-01-21 NOTE — Anesthesia Preprocedure Evaluation (Signed)
Anesthesia Evaluation  Patient identified by MRN, date of birth, ID band Patient awake    Reviewed: Allergy & Precautions, H&P , NPO status , Patient's Chart, lab work & pertinent test results  History of Anesthesia Complications Negative for: history of anesthetic complications  Airway Mallampati: II  TM Distance: >3 FB Neck ROM: Full    Dental no notable dental hx. (+) Dental Advisory Given, Teeth Intact   Pulmonary neg pulmonary ROS, former smoker   Pulmonary exam normal breath sounds clear to auscultation       Cardiovascular Exercise Tolerance: Good hypertension, Pt. on medications Normal cardiovascular exam Rhythm:Regular Rate:Normal     Neuro/Psych  Headaches PSYCHIATRIC DISORDERS Anxiety Depression    CVA (cognitive deficits), Residual Symptoms    GI/Hepatic negative GI ROS, Neg liver ROS,,,  Endo/Other  diabetes (last dose of trulicity - 3 weeks ago), Well Controlled, Type 2, Oral Hypoglycemic Agents    Renal/GU Renal InsufficiencyRenal disease (stones)  negative genitourinary   Musculoskeletal  (+) Arthritis , Osteoarthritis,    Abdominal   Peds negative pediatric ROS (+)  Hematology negative hematology ROS (+)   Anesthesia Other Findings   Reproductive/Obstetrics negative OB ROS                             Anesthesia Physical Anesthesia Plan  ASA: 3  Anesthesia Plan: General   Post-op Pain Management: Dilaudid IV   Induction: Intravenous  PONV Risk Score and Plan: 4 or greater and Ondansetron and Dexamethasone  Airway Management Planned: Oral ETT and LMA  Additional Equipment:   Intra-op Plan:   Post-operative Plan: Extubation in OR  Informed Consent: I have reviewed the patients History and Physical, chart, labs and discussed the procedure including the risks, benefits and alternatives for the proposed anesthesia with the patient or authorized representative who  has indicated his/her understanding and acceptance.     Dental advisory given  Plan Discussed with: CRNA and Surgeon  Anesthesia Plan Comments:        Anesthesia Quick Evaluation

## 2023-01-21 NOTE — Anesthesia Procedure Notes (Signed)
Procedure Name: LMA Insertion Date/Time: 01/21/2023 12:14 PM  Performed by: Moshe Salisbury, CRNAPre-anesthesia Checklist: Patient identified, Patient being monitored, Emergency Drugs available, Timeout performed and Suction available Patient Re-evaluated:Patient Re-evaluated prior to induction Oxygen Delivery Method: Circle System Utilized Preoxygenation: Pre-oxygenation with 100% oxygen Induction Type: IV induction Ventilation: Mask ventilation without difficulty LMA: LMA inserted LMA Size: 5.0 Number of attempts: 1 Placement Confirmation: positive ETCO2 and breath sounds checked- equal and bilateral

## 2023-01-21 NOTE — Anesthesia Postprocedure Evaluation (Signed)
Anesthesia Post Note  Patient: Malik Hill  Procedure(s) Performed: CYSTOSCOPY WITH RETROGRADE PYELOGRAM, URETEROSCOPY AND STENT PLACEMENT (Left: Penis) URETHRAL DILATION UNDER ANESTHESIA (Penis)  Patient location during evaluation: Phase II Anesthesia Type: General Level of consciousness: awake and alert and oriented Pain management: pain level controlled Vital Signs Assessment: post-procedure vital signs reviewed and stable Respiratory status: spontaneous breathing, nonlabored ventilation and respiratory function stable Cardiovascular status: blood pressure returned to baseline and stable Postop Assessment: no apparent nausea or vomiting Anesthetic complications: no  No notable events documented.   Last Vitals:  Vitals:   01/21/23 1345 01/21/23 1403  BP: 123/76 118/71  Pulse: 64 70  Resp: 14 16  Temp: (!) 36.4 C 36.4 C  SpO2: 93% 97%    Last Pain:  Vitals:   01/21/23 1403  TempSrc: Oral  PainSc: 0-No pain                 Perla Echavarria C Malyiah Fellows

## 2023-01-27 ENCOUNTER — Ambulatory Visit: Payer: BC Managed Care – PPO | Admitting: Urology

## 2023-01-27 ENCOUNTER — Encounter (HOSPITAL_COMMUNITY): Payer: Self-pay | Admitting: Urology

## 2023-01-27 VITALS — BP 113/73 | HR 77

## 2023-01-27 DIAGNOSIS — R310 Gross hematuria: Secondary | ICD-10-CM

## 2023-01-27 DIAGNOSIS — N200 Calculus of kidney: Secondary | ICD-10-CM | POA: Diagnosis not present

## 2023-01-27 MED ORDER — TAMSULOSIN HCL 0.4 MG PO CAPS
0.4000 mg | ORAL_CAPSULE | Freq: Every day | ORAL | 1 refills | Status: DC
Start: 2023-01-27 — End: 2023-03-17

## 2023-01-27 MED ORDER — CIPROFLOXACIN HCL 500 MG PO TABS
500.0000 mg | ORAL_TABLET | Freq: Once | ORAL | Status: AC
Start: 2023-01-27 — End: 2023-01-27
  Administered 2023-01-27: 500 mg via ORAL

## 2023-01-27 MED ORDER — TRAMADOL HCL 50 MG PO TABS
50.0000 mg | ORAL_TABLET | Freq: Four times a day (QID) | ORAL | 0 refills | Status: DC | PRN
Start: 2023-01-27 — End: 2023-05-18

## 2023-01-27 NOTE — Progress Notes (Signed)
01/27/2023 11:54 AM   Malik Hill 27-Dec-1960 [id number]  Referring provider: Estanislado Pandy, MD [address] [place],  [place] [address]  Followup ureteroscopy   HPI: Mr Malik Hill is a 61yo here for follouwp after left diagnostic ureteroscopy. He was found to have a large left renal calculus and was started on sodium bicarb. He currently has a stent in place., He has mild left flank pain. He has mild LUTS./ no other complaints today   PMH: Past Medical History:  Diagnosis Date   Anxiety    BPH (benign prostatic hyperplasia)    Depression    Diabetes mellitus without complication    Headache    Kidney stones    MDD (major depressive disorder)    Memory loss    Osteoarthritis    Stroke 09/2021    Surgical History: Past Surgical History:  Procedure Laterality Date   BUNIONECTOMY     COLONOSCOPY  06/09/2007   CYSTOSCOPY WITH RETROGRADE PYELOGRAM, URETEROSCOPY AND STENT PLACEMENT Left 01/21/2023   Procedure: CYSTOSCOPY WITH RETROGRADE PYELOGRAM, URETEROSCOPY AND STENT PLACEMENT;  Surgeon: Malen Gauze, MD;  Location: [facility];  Service: Urology;  Laterality: Left;   KNEE SURGERY Left 2015   PENILE ADHESIONS LYSIS     TONSILLECTOMY AND ADENOIDECTOMY     age 84    Home Medications:  Allergies as of 01/27/2023       Reactions   Penicillins Rash        Medication List        Accurate as of January 27, 2023 11:54 AM. If you have any questions, ask your nurse or doctor.          acetaminophen 500 MG tablet Commonly known as: TYLENOL Take 1,000 mg by mouth every 6 (six) hours as needed for moderate pain or headache.   ALPRAZolam 1 MG tablet Commonly known as: XANAX Take 1 mg by mouth 4 (four) times daily as needed.   atenolol 25 MG tablet Commonly known as: TENORMIN Take 25 mg by mouth 2 (two) times daily.   atorvastatin 40 MG tablet Commonly known as: Lipitor Take 1 tablet (40 mg total) by mouth daily.   cetirizine 10 MG tablet Commonly known  as: ZYRTEC Take 10 mg by mouth daily as needed for allergies.   cyanocobalamin 1000 MCG tablet Take 1 tablet (1,000 mcg total) by mouth daily.   donepezil 5 MG tablet Commonly known as: ARICEPT Take 1 tablet (5 mg total) by mouth daily. Take for 4 weeks, then increase to 10 mg daily   donepezil 10 MG tablet Commonly known as: ARICEPT Take 1 tablet (10 mg total) by mouth daily.   Emgality 120 MG/ML Soaj Generic drug: Galcanezumab-gnlm Inject 120 mg into the skin every 30 (thirty) days.   fluticasone 50 MCG/ACT nasal spray Commonly known as: FLONASE Place 2 sprays into both nostrils daily as needed for allergies or rhinitis.   glipiZIDE 10 MG 24 hr tablet Commonly known as: GLUCOTROL XL Take 10 mg by mouth 2 (two) times daily.   hydrochlorothiazide 12.5 MG capsule Commonly known as: MICROZIDE Take 12.5 mg by mouth daily.   Jardiance 25 MG Tabs tablet Generic drug: empagliflozin Take 25 mg by mouth daily.   losartan 25 MG tablet Commonly known as: COZAAR Take 25 mg by mouth daily.   Nurtec 75 MG Tbdp Generic drug: Rimegepant Sulfate Take 1 tablet (75 mg total) by mouth as needed (migraine). Max dose 1 pill in 24 hours  omeprazole 20 MG tablet Commonly known as: PRILOSEC OTC Take 20 mg by mouth daily as needed (acid reflux).   ondansetron 4 MG tablet Commonly known as: Zofran Take 1 tablet (4 mg total) by mouth daily as needed for nausea or vomiting.   oxyCODONE-acetaminophen 5-325 MG tablet Commonly known as: Percocet Take 1 tablet by mouth every 4 (four) hours as needed for severe pain.   PARoxetine 30 MG tablet Commonly known as: PAXIL Take 60 mg by mouth daily.   phenazopyridine 100 MG tablet Commonly known as: PYRIDIUM Take 1 tablet (100 mg total) by mouth 3 (three) times daily as needed for pain.   pyridoxine 100 MG tablet Commonly known as: B-6 Take 1 tablet (100 mg total) by mouth daily.   QUEtiapine 100 MG tablet Commonly known as:  SEROQUEL Take 100 mg by mouth at bedtime.   sodium bicarbonate 650 MG tablet Take 1 tablet (650 mg total) by mouth 2 (two) times daily.   traZODone 100 MG tablet Commonly known as: DESYREL Take 100 mg by mouth daily.   Trulicity 1.5 MG/0.5ML Sopn Generic drug: Dulaglutide Inject 1.5 mg into the skin every Wednesday.   Vitamin D (Ergocalciferol) 1.25 MG (50000 UNIT) Caps capsule Commonly known as: DRISDOL Take 1 capsule (50,000 Units total) by mouth every 7 (seven) days.        Allergies:  Allergies  Allergen Reactions   Penicillins Rash    Family History: Family History  Problem Relation Age of Onset   Hypertension Mother    Hypothyroidism Mother    Heart attack Father    Cancer Sister     Social History:  reports that he has quit smoking. His smoking use included cigarettes. He has never used smokeless tobacco. He reports that he does not drink alcohol and does not use drugs.  ROS: All other review of systems were reviewed and are negative except what is noted above in HPI  Physical Exam: BP 113/73   Pulse 77   Constitutional:  Alert and oriented, No acute distress. HEENT: [place] AT, moist mucus membranes.  Trachea midline, no masses. Cardiovascular: No clubbing, cyanosis, or edema. Respiratory: Normal respiratory effort, no increased work of breathing. GI: Abdomen is soft, nontender, nondistended, no abdominal masses GU: No CVA tenderness.  Lymph: No cervical or inguinal lymphadenopathy. Skin: No rashes, bruises or suspicious lesions. Neurologic: Grossly intact, no focal deficits, moving all 4 extremities. Psychiatric: Normal mood and affect.  Laboratory Data: Lab Results  Component Value Date   WBC 3.4 (L) 09/29/2021   HGB 13.6 09/29/2021   HCT 41.5 09/29/2021   MCV 90.0 09/29/2021   PLT 101 (L) 09/29/2021    Lab Results  Component Value Date   CREATININE 1.19 01/18/2023    No results found for: "PSA"  No results found for: "TESTOSTERONE"  Lab  Results  Component Value Date   HGBA1C 7.0 (H) 01/18/2023    Urinalysis    Component Value Date/Time   COLORURINE YELLOW 09/25/2021 1411   APPEARANCEUR Clear 12/14/2022 1406   LABSPEC 1.015 09/25/2021 1411   PHURINE 5.5 09/25/2021 1411   GLUCOSEU 3+ (A) 12/14/2022 1406   HGBUR LARGE (A) 09/25/2021 1411   BILIRUBINUR Negative 12/14/2022 1406   KETONESUR 15 (A) 09/25/2021 1411   PROTEINUR Negative 12/14/2022 1406   PROTEINUR TRACE (A) 09/25/2021 1411   UROBILINOGEN 0.2 02/17/2008 1558   NITRITE Negative 12/14/2022 1406   NITRITE NEGATIVE 09/25/2021 1411   LEUKOCYTESUR Negative 12/14/2022 1406   LEUKOCYTESUR NEGATIVE 09/25/2021  1411    Lab Results  Component Value Date   LABMICR See below: 12/14/2022   WBCUA 0-5 12/14/2022   LABEPIT 0-10 12/14/2022   BACTERIA None seen 12/14/2022    Pertinent Imaging:  No results found for this or any previous visit.  No results found for this or any previous visit.  No results found for this or any previous visit.  No results found for this or any previous visit.  No results found for this or any previous visit.  No valid procedures specified. Results for orders placed during the hospital encounter of 12/04/22  CT HEMATURIA WORKUP  Narrative CLINICAL DATA:  Gross hematuria * Tracking Code: BO *  EXAM: CT ABDOMEN AND PELVIS WITHOUT AND WITH CONTRAST  TECHNIQUE: Multidetector CT imaging of the abdomen and pelvis was performed following the standard protocol before and following the bolus administration of intravenous contrast.  RADIATION DOSE REDUCTION: This exam was performed according to the departmental dose-optimization program which includes automated exposure control, adjustment of the mA and/or kV according to patient size and/or use of iterative reconstruction technique.  CONTRAST:  OMNIPAQUE IOHEXOL 350 MG/ML SOLN  COMPARISON:  02/13/2008  FINDINGS: Lower chest: No acute abnormality.  Hepatobiliary:  No solid liver abnormality is seen. Coarse, nodular contour of the liver. No gallstones, gallbladder wall thickening, or biliary dilatation.  Pancreas: Unremarkable. No pancreatic ductal dilatation or surrounding inflammatory changes.  Spleen: Splenomegaly, maximum coronal span 16.5 cm.  Adrenals/Urinary Tract: Adrenal glands are unremarkable. Numerous left renal calculi. Heterogeneous, expansile and faintly calcified appearance of the left renal pelvis filling defect measuring 4.9 x 2.9 x 1.8 cm (series 10, image 64, series 7, image 52). The right kidney is normal, without renal calculi, solid lesion, or hydronephrosis. Mild wall thickening of the urinary bladder.  Stomach/Bowel: Stomach is within normal limits. Diverticulum of the descending duodenum. Appendix appears normal. No evidence of bowel wall thickening, distention, or inflammatory changes. Descending and sigmoid diverticulosis.  Vascular/Lymphatic: No significant vascular findings are present. No enlarged abdominal or pelvic lymph nodes.  Reproductive: No mass or other significant abnormality.  Other: No abdominal wall hernia or abnormality. No ascites.  Musculoskeletal: No acute or significant osseous findings.  IMPRESSION: 1. Numerous left renal calculi. 2. Heterogeneous, expansile and faintly calcified appearance of the left renal pelvis filling defect measuring 4.9 x 2.9 x 1.8 cm. This may reflect a large, partially calcified staghorn calculus but appearance is somewhat concerning for urothelial malignancy. 3. No evidence of lymphadenopathy or metastatic disease in the abdomen or pelvis. 4. Mild wall thickening of the urinary bladder, likely related to chronic outlet obstruction 5. Cirrhotic morphology of the liver.  Splenomegaly. 6. Descending and sigmoid diverticulosis without evidence of acute diverticulitis.  These results will be called to the ordering clinician or representative by the Radiologist  Assistant, and communication documented in the PACS or [employer].   Electronically Signed By: Jearld Lesch M.D. On: 12/05/2022 17:04  No results found for this or any previous visit.   Assessment & Plan:    1. Gross hematuria -likely related to large renal calculus  2. Nephrolithiasis -continue sodium bicarbonate -flomax 0.4mg  daily -CT stone study in 2 weeks -followup 6 weeks with renal US   No follow-ups on file.  Wilkie Aye, MD  [facility] [place]

## 2023-01-27 NOTE — Progress Notes (Signed)
Pt here today for bladder scan. Bladder was scanned and 11 ml was visualized.    Performed by Kennyth Lose, CMA  Additional follow up 2 weeks Ct stone study and 6 week follow up renal US per Dr. Ronne Binning

## 2023-01-27 NOTE — Progress Notes (Signed)
Fill and Pull Catheter Removal  Patient is present today for a catheter removal.  Patient was cleaned and prepped in a sterile fashion 295 ml of sterile water/ saline was instilled into the bladder when the patient felt the urge to urinate. 10 ml of water was then drained from the balloon.  A 16 FR foley cath was removed from the bladder no complications were noted . Patient as then given some time to void on their own.  Patient can void  275 ml on their own after some time.  Patient tolerated well.  Performed by: Kennyth Lose, CMA  Follow up/ Additional notes: Return @ 3 pm for PVR

## 2023-01-28 LAB — URINALYSIS, ROUTINE W REFLEX MICROSCOPIC
Bilirubin, UA: NEGATIVE
Ketones, UA: NEGATIVE
Nitrite, UA: NEGATIVE
Protein,UA: NEGATIVE
Specific Gravity, UA: 1.01 (ref 1.005–1.030)
Urobilinogen, Ur: 0.2 mg/dL (ref 0.2–1.0)
pH, UA: 5.5 (ref 5.0–7.5)

## 2023-01-28 LAB — MICROSCOPIC EXAMINATION: Bacteria, UA: NONE SEEN

## 2023-02-01 NOTE — Telephone Encounter (Signed)
Stop donepezil. Could be causing diarrhea. -VRP

## 2023-02-02 ENCOUNTER — Other Ambulatory Visit: Payer: Self-pay | Admitting: Psychiatry

## 2023-02-02 ENCOUNTER — Encounter: Payer: Self-pay | Admitting: Urology

## 2023-02-02 NOTE — Patient Instructions (Signed)

## 2023-02-03 NOTE — Telephone Encounter (Signed)
Last seen on 11/12/22 per note "-Prevention: Continue Emgality 120 mg monthly " Follow up scheduled on 06/17/23  Rx last filled on 02/01/23 #2ml (30 day supply)

## 2023-02-16 ENCOUNTER — Telehealth: Payer: Self-pay

## 2023-02-16 NOTE — Telephone Encounter (Signed)
Please see below.  I do not have a posting sheet.  Please provide posting sheet if you recommend moving forward with surgery.  Follow up with you on 06/12

## 2023-02-16 NOTE — Telephone Encounter (Signed)
Patient called wanting to follow up regarding his next surgery date. He has a stent exchange last month and has an upcoming CT. He requested a call back once it has been determined.    Thank you

## 2023-02-18 NOTE — Telephone Encounter (Signed)
FYI Patient states that he is passing a lot of "grit" , he is aware to have his CT and follow up in June as scheduled.

## 2023-02-19 ENCOUNTER — Ambulatory Visit (HOSPITAL_COMMUNITY)
Admission: RE | Admit: 2023-02-19 | Discharge: 2023-02-19 | Disposition: A | Discharge status: Home / Self Care | Payer: BC Managed Care – PPO | Source: Ambulatory Visit | Attending: Urology | Admitting: Urology

## 2023-02-19 DIAGNOSIS — N200 Calculus of kidney: Secondary | ICD-10-CM | POA: Diagnosis present

## 2023-03-17 ENCOUNTER — Encounter: Payer: Self-pay | Admitting: Urology

## 2023-03-17 ENCOUNTER — Ambulatory Visit (INDEPENDENT_AMBULATORY_CARE_PROVIDER_SITE_OTHER): Payer: BC Managed Care – PPO | Admitting: Urology

## 2023-03-17 VITALS — BP 111/78 | HR 130

## 2023-03-17 DIAGNOSIS — R310 Gross hematuria: Secondary | ICD-10-CM

## 2023-03-17 DIAGNOSIS — N5201 Erectile dysfunction due to arterial insufficiency: Secondary | ICD-10-CM

## 2023-03-17 DIAGNOSIS — N200 Calculus of kidney: Secondary | ICD-10-CM

## 2023-03-17 LAB — URINALYSIS, ROUTINE W REFLEX MICROSCOPIC
Bilirubin, UA: NEGATIVE
Ketones, UA: NEGATIVE
Nitrite, UA: NEGATIVE
Protein,UA: NEGATIVE
Specific Gravity, UA: 1.01 (ref 1.005–1.030)
Urobilinogen, Ur: 0.2 mg/dL (ref 0.2–1.0)
pH, UA: 6 (ref 5.0–7.5)

## 2023-03-17 LAB — MICROSCOPIC EXAMINATION
Bacteria, UA: NONE SEEN
RBC, Urine: >30 /hpf — AB (ref 0–2)

## 2023-03-17 MED ORDER — TAMSULOSIN HCL 0.4 MG PO CAPS
0.4000 mg | ORAL_CAPSULE | Freq: Every day | ORAL | 11 refills | Status: DC
Start: 2023-03-17 — End: 2024-03-09

## 2023-03-17 MED ORDER — SODIUM BICARBONATE 650 MG PO TABS
650.0000 mg | ORAL_TABLET | Freq: Two times a day (BID) | ORAL | 11 refills | Status: DC
Start: 2023-03-17 — End: 2023-09-01

## 2023-03-17 NOTE — Progress Notes (Signed)
03/17/2023 2:25 PM   Malik Hill December 09, 1960 [id number]  Referring provider: Estanislado Pandy, MD [address] [place],  [place] [address]  Followup nephrolithiasis   HPI: Malik Hill is a 61yo here for followup for nephrolithiasis and for evaluation of erectile dysfunction. He remains on sodium bicarbonate for uric acid stones. CT from 5/17 showed 50% decrease in stone burden with the stent in place. He denies any flank pain. No worsening LUTS. For the past 3 years he has noted difficulty getting and maintaining an erection. No prior PDE5s. Good exercise tolerance.    PMH: Past Medical History:  Diagnosis Date   Anxiety    BPH (benign prostatic hyperplasia)    Depression    Diabetes mellitus without complication (HCC)    Headache    Kidney stones    MDD (major depressive disorder)    Memory loss    Osteoarthritis    Stroke (HCC) 09/2021    Surgical History: Past Surgical History:  Procedure Laterality Date   BUNIONECTOMY     COLONOSCOPY  06/09/2007   CYSTOSCOPY WITH RETROGRADE PYELOGRAM, URETEROSCOPY AND STENT PLACEMENT Left 01/21/2023   Procedure: CYSTOSCOPY WITH RETROGRADE PYELOGRAM, URETEROSCOPY AND STENT PLACEMENT;  Surgeon: Malen Gauze, MD;  Location: [facility];  Service: Urology;  Laterality: Left;   KNEE SURGERY Left 2015   PENILE ADHESIONS LYSIS     TONSILLECTOMY AND ADENOIDECTOMY     age 70    Home Medications:  Allergies as of 03/17/2023       Reactions   Penicillins Rash        Medication List        Accurate as of March 17, 2023  2:25 PM. If you have any questions, ask your nurse or doctor.          acetaminophen 500 MG tablet Commonly known as: TYLENOL Take 1,000 mg by mouth every 6 (six) hours as needed for moderate pain or headache.   ALPRAZolam 1 MG tablet Commonly known as: XANAX Take 1 mg by mouth 4 (four) times daily as needed.   atenolol 25 MG tablet Commonly known as: TENORMIN Take 25 mg by mouth 2 (two) times  daily.   atorvastatin 40 MG tablet Commonly known as: Lipitor Take 1 tablet (40 mg total) by mouth daily.   cetirizine 10 MG tablet Commonly known as: ZYRTEC Take 10 mg by mouth daily as needed for allergies.   cyanocobalamin 1000 MCG tablet Take 1 tablet (1,000 mcg total) by mouth daily.   donepezil 5 MG tablet Commonly known as: ARICEPT Take 1 tablet (5 mg total) by mouth daily. Take for 4 weeks, then increase to 10 mg daily   donepezil 10 MG tablet Commonly known as: ARICEPT Take 1 tablet (10 mg total) by mouth daily.   Emgality 120 MG/ML Soaj Generic drug: Galcanezumab-gnlm Inject 120 mg into the skin every 30 (thirty) days.   fluticasone 50 MCG/ACT nasal spray Commonly known as: FLONASE Place 2 sprays into both nostrils daily as needed for allergies or rhinitis.   glipiZIDE 10 MG 24 hr tablet Commonly known as: GLUCOTROL XL Take 10 mg by mouth 2 (two) times daily.   hydrochlorothiazide 12.5 MG capsule Commonly known as: MICROZIDE Take 12.5 mg by mouth daily.   Jardiance 25 MG Tabs tablet Generic drug: empagliflozin Take 25 mg by mouth daily.   losartan 25 MG tablet Commonly known as: COZAAR Take 25 mg by mouth daily.   Nurtec 75 MG Tbdp  Generic drug: Rimegepant Sulfate Take 1 tablet (75 mg total) by mouth as needed (migraine). Max dose 1 pill in 24 hours   omeprazole 20 MG tablet Commonly known as: PRILOSEC OTC Take 20 mg by mouth daily as needed (acid reflux).   ondansetron 4 MG tablet Commonly known as: Zofran Take 1 tablet (4 mg total) by mouth daily as needed for nausea or vomiting.   oxyCODONE-acetaminophen 5-325 MG tablet Commonly known as: Percocet Take 1 tablet by mouth every 4 (four) hours as needed for severe pain.   PARoxetine 30 MG tablet Commonly known as: PAXIL Take 60 mg by mouth daily.   phenazopyridine 100 MG tablet Commonly known as: PYRIDIUM Take 1 tablet (100 mg total) by mouth 3 (three) times daily as needed for pain.    pyridoxine 100 MG tablet Commonly known as: B-6 Take 1 tablet (100 mg total) by mouth daily.   QUEtiapine 100 MG tablet Commonly known as: SEROQUEL Take 100 mg by mouth at bedtime.   sodium bicarbonate 650 MG tablet Take 1 tablet (650 mg total) by mouth 2 (two) times daily.   tamsulosin 0.4 MG Caps capsule Commonly known as: FLOMAX Take 1 capsule (0.4 mg total) by mouth daily after supper.   traMADol 50 MG tablet Commonly known as: Ultram Take 1 tablet (50 mg total) by mouth every 6 (six) hours as needed.   traZODone 100 MG tablet Commonly known as: DESYREL Take 100 mg by mouth daily.   Trulicity 1.5 MG/0.5ML Sopn Generic drug: Dulaglutide Inject 1.5 mg into the skin every Wednesday.   Vitamin D (Ergocalciferol) 1.25 MG (50000 UNIT) Caps capsule Commonly known as: DRISDOL Take 1 capsule (50,000 Units total) by mouth every 7 (seven) days.        Allergies:  Allergies  Allergen Reactions   Penicillins Rash    Family History: Family History  Problem Relation Age of Onset   Hypertension Mother    Hypothyroidism Mother    Heart attack Father    Cancer Sister     Social History:  reports that he has quit smoking. His smoking use included cigarettes. He has never used smokeless tobacco. He reports that he does not drink alcohol and does not use drugs.  ROS: All other review of systems were reviewed and are negative except what is noted above in HPI  Physical Exam: BP 111/78   Pulse (!) 130   Constitutional:  Alert and oriented, No acute distress. HEENT: [place] AT, moist mucus membranes.  Trachea midline, no masses. Cardiovascular: No clubbing, cyanosis, or edema. Respiratory: Normal respiratory effort, no increased work of breathing. GI: Abdomen is soft, nontender, nondistended, no abdominal masses GU: No CVA tenderness.  Lymph: No cervical or inguinal lymphadenopathy. Skin: No rashes, bruises or suspicious lesions. Neurologic: Grossly intact, no focal  deficits, moving all 4 extremities. Psychiatric: Normal mood and affect.  Laboratory Data: Lab Results  Component Value Date   WBC 3.4 (L) 09/29/2021   HGB 13.6 09/29/2021   HCT 41.5 09/29/2021   MCV 90.0 09/29/2021   PLT 101 (L) 09/29/2021    Lab Results  Component Value Date   CREATININE 1.19 01/18/2023    No results found for: "PSA"  No results found for: "TESTOSTERONE"  Lab Results  Component Value Date   HGBA1C 7.0 (H) 01/18/2023    Urinalysis    Component Value Date/Time   COLORURINE YELLOW 09/25/2021 1411   APPEARANCEUR Cloudy (A) 01/27/2023 1546   LABSPEC 1.015 09/25/2021 1411   PHURINE  5.5 09/25/2021 1411   GLUCOSEU 3+ (A) 01/27/2023 1546   HGBUR LARGE (A) 09/25/2021 1411   BILIRUBINUR Negative 01/27/2023 1546   KETONESUR 15 (A) 09/25/2021 1411   PROTEINUR Negative 01/27/2023 1546   PROTEINUR TRACE (A) 09/25/2021 1411   UROBILINOGEN 0.2 02/17/2008 1558   NITRITE Negative 01/27/2023 1546   NITRITE NEGATIVE 09/25/2021 1411   LEUKOCYTESUR Trace (A) 01/27/2023 1546   LEUKOCYTESUR NEGATIVE 09/25/2021 1411    Lab Results  Component Value Date   LABMICR See below: 01/27/2023   WBCUA 0-5 01/27/2023   LABEPIT 0-10 01/27/2023   BACTERIA None seen 01/27/2023    Pertinent Imaging: Ct 02/19/2023: Images reviewed and discussed with patient  No results found for this or any previous visit.  No results found for this or any previous visit.  No results found for this or any previous visit.  No results found for this or any previous visit.  No results found for this or any previous visit.  No valid procedures specified. Results for orders placed during the hospital encounter of 12/04/22  CT HEMATURIA WORKUP  Narrative CLINICAL DATA:  Gross hematuria * Tracking Code: BO *  EXAM: CT ABDOMEN AND PELVIS WITHOUT AND WITH CONTRAST  TECHNIQUE: Multidetector CT imaging of the abdomen and pelvis was performed following the standard protocol before and  following the bolus administration of intravenous contrast.  RADIATION DOSE REDUCTION: This exam was performed according to the departmental dose-optimization program which includes automated exposure control, adjustment of the mA and/or kV according to patient size and/or use of iterative reconstruction technique.  CONTRAST:  OMNIPAQUE IOHEXOL 350 MG/ML SOLN  COMPARISON:  02/13/2008  FINDINGS: Lower chest: No acute abnormality.  Hepatobiliary: No solid liver abnormality is seen. Coarse, nodular contour of the liver. No gallstones, gallbladder wall thickening, or biliary dilatation.  Pancreas: Unremarkable. No pancreatic ductal dilatation or surrounding inflammatory changes.  Spleen: Splenomegaly, maximum coronal span 16.5 cm.  Adrenals/Urinary Tract: Adrenal glands are unremarkable. Numerous left renal calculi. Heterogeneous, expansile and faintly calcified appearance of the left renal pelvis filling defect measuring 4.9 x 2.9 x 1.8 cm (series 10, image 64, series 7, image 52). The right kidney is normal, without renal calculi, solid lesion, or hydronephrosis. Mild wall thickening of the urinary bladder.  Stomach/Bowel: Stomach is within normal limits. Diverticulum of the descending duodenum. Appendix appears normal. No evidence of bowel wall thickening, distention, or inflammatory changes. Descending and sigmoid diverticulosis.  Vascular/Lymphatic: No significant vascular findings are present. No enlarged abdominal or pelvic lymph nodes.  Reproductive: No mass or other significant abnormality.  Other: No abdominal wall hernia or abnormality. No ascites.  Musculoskeletal: No acute or significant osseous findings.  IMPRESSION: 1. Numerous left renal calculi. 2. Heterogeneous, expansile and faintly calcified appearance of the left renal pelvis filling defect measuring 4.9 x 2.9 x 1.8 cm. This may reflect a large, partially calcified staghorn calculus  but appearance is somewhat concerning for urothelial malignancy. 3. No evidence of lymphadenopathy or metastatic disease in the abdomen or pelvis. 4. Mild wall thickening of the urinary bladder, likely related to chronic outlet obstruction 5. Cirrhotic morphology of the liver.  Splenomegaly. 6. Descending and sigmoid diverticulosis without evidence of acute diverticulitis.  These results will be called to the ordering clinician or representative by the Radiologist Assistant, and communication documented in the PACS or [employer].   Electronically Signed By: Jearld Lesch M.D. On: 12/05/2022 17:04  Results for orders placed during the hospital encounter of 02/19/23  CT  RENAL STONE STUDY  Narrative CLINICAL DATA:  Urolithiasis symptomatic.  EXAM: CT ABDOMEN AND PELVIS WITHOUT CONTRAST  TECHNIQUE: Multidetector CT imaging of the abdomen and pelvis was performed following the standard protocol without IV contrast.  RADIATION DOSE REDUCTION: This exam was performed according to the departmental dose-optimization program which includes automated exposure control, adjustment of the mA and/or kV according to patient size and/or use of iterative reconstruction technique.  COMPARISON:  CT December 04, 2022  FINDINGS: Lower chest: No acute abnormality.  Hepatobiliary: Cirrhotic hepatic morphology. Gallbladder is unremarkable. No biliary ductal dilation.  Pancreas: No pancreatic ductal dilation or evidence of acute inflammation. Periampullary duodenal diverticulum.  Spleen: Calcified splenic granuloma. Similar mild splenomegaly measuring 15.8 cm in maximum craniocaudal dimension.  Adrenals/Urinary Tract: Bilateral adrenal glands appear normal.  Interval placement of a left double-J ureteral stent with pigtail in the renal pelvis and urinary bladder. Decrease in size and number of the faintly mineralized stones in the left renal pelvis measuring up to 13 mm. Additional  densely mineralized nonobstructive renal stones in the left kidney measure up to 8 mm. No bladder calculus identified.  Stomach/Bowel: No radiopaque enteric contrast material was administered. Stomach is minimally distended by ingested material and gas. No pathologic dilation of small or large bowel. Normal appendix. Colonic diverticulosis without findings of acute diverticulitis.  Vascular/Lymphatic: Normal caliber abdominal aorta. Smooth IVC contours. Retroaortic left renal vein. No pathologically enlarged abdominal or pelvic lymph nodes.  Reproductive: Prostate is unremarkable. Calcifications along the dorsal aspect of the penis are similar prior may reflect sequela of Peyronie's disease.  Other: No significant abdominopelvic free fluid.  Musculoskeletal: Similar mild compression deformity of the L1 vertebral body. Probable vertebral body hemangioma at L1 is unchanged dating back to Feb 13, 2008. Multilevel degenerative change of the spine with marked discogenic disease at L5-S1 producing spinal canal narrowing and bilateral neural foraminal impingement.  IMPRESSION: 1. Interval placement of a left double-J ureteral stent with decrease in size and number of the faintly mineralized stones in the left renal pelvis measuring up to 13 mm. Additional densely mineralized nonobstructive renal stones in the left kidney measure up to 8 mm. No bladder calculus identified. 2. Cirrhotic hepatic morphology with splenomegaly indicative of portal venous hypertension. 3. Colonic diverticulosis without findings of acute diverticulitis.   Electronically Signed By: Maudry Mayhew M.D. On: 02/21/2023 09:01   Assessment & Plan:    1. Nephrolithiasis Continue sodium bicarb -followup 8 weeks with CT Stone study - Urinalysis, Routine w reflex microscopic   No follow-ups on file.  Wilkie Aye, MD  [facility] [place]

## 2023-03-23 ENCOUNTER — Encounter: Payer: Self-pay | Admitting: Urology

## 2023-03-23 NOTE — Patient Instructions (Signed)

## 2023-04-07 ENCOUNTER — Encounter: Payer: Self-pay | Admitting: Psychiatry

## 2023-05-03 ENCOUNTER — Ambulatory Visit (HOSPITAL_COMMUNITY)
Admission: RE | Admit: 2023-05-03 | Discharge: 2023-05-03 | Disposition: A | Discharge status: Home / Self Care | Payer: BC Managed Care – PPO | Source: Ambulatory Visit | Attending: Urology | Admitting: Urology

## 2023-05-03 DIAGNOSIS — N200 Calculus of kidney: Secondary | ICD-10-CM | POA: Insufficient documentation

## 2023-05-05 ENCOUNTER — Ambulatory Visit (INDEPENDENT_AMBULATORY_CARE_PROVIDER_SITE_OTHER): Payer: BC Managed Care – PPO | Admitting: Urology

## 2023-05-05 VITALS — BP 109/77 | HR 94

## 2023-05-05 DIAGNOSIS — N200 Calculus of kidney: Secondary | ICD-10-CM

## 2023-05-05 LAB — URINALYSIS, ROUTINE W REFLEX MICROSCOPIC
Bilirubin, UA: NEGATIVE
Ketones, UA: NEGATIVE
Leukocytes,UA: NEGATIVE
Nitrite, UA: NEGATIVE
Protein,UA: NEGATIVE
RBC, UA: NEGATIVE
Specific Gravity, UA: 1.01 (ref 1.005–1.030)
Urobilinogen, Ur: 0.2 mg/dL (ref 0.2–1.0)
pH, UA: 6.5 (ref 5.0–7.5)

## 2023-05-05 NOTE — Progress Notes (Signed)
05/05/2023 3:06 PM   Malik Hill 1961/01/18 [id number]  Referring provider: Estanislado Pandy, MD [address] [place],  [place] [address]  Followup nephrolithiasis   HPI: Malik Hill is a 62yo here for followup for nephrolithiasis. Ct stone study shows residual 5-22mm left renal calculi. He denies any flank pain. No worsening LUTS. He remains on sodium bicarbonate 650mg  BID.    PMH: Past Medical History:  Diagnosis Date   Anxiety    BPH (benign prostatic hyperplasia)    Depression    Diabetes mellitus without complication (HCC)    Headache    Kidney stones    MDD (major depressive disorder)    Memory loss    Osteoarthritis    Stroke (HCC) 09/2021    Surgical History: Past Surgical History:  Procedure Laterality Date   BUNIONECTOMY     COLONOSCOPY  06/09/2007   CYSTOSCOPY WITH RETROGRADE PYELOGRAM, URETEROSCOPY AND STENT PLACEMENT Left 01/21/2023   Procedure: CYSTOSCOPY WITH RETROGRADE PYELOGRAM, URETEROSCOPY AND STENT PLACEMENT;  Surgeon: Malen Gauze, MD;  Location: [facility];  Service: Urology;  Laterality: Left;   KNEE SURGERY Left 2015   PENILE ADHESIONS LYSIS     TONSILLECTOMY AND ADENOIDECTOMY     age 89    Home Medications:  Allergies as of 05/05/2023       Reactions   Penicillins Rash        Medication List        Accurate as of May 05, 2023  3:06 PM. If you have any questions, ask your nurse or doctor.          acetaminophen 500 MG tablet Commonly known as: TYLENOL Take 1,000 mg by mouth every 6 (six) hours as needed for moderate pain or headache.   ALPRAZolam 1 MG tablet Commonly known as: XANAX Take 1 mg by mouth 4 (four) times daily as needed.   atenolol 25 MG tablet Commonly known as: TENORMIN Take 25 mg by mouth 2 (two) times daily.   atorvastatin 40 MG tablet Commonly known as: Lipitor Take 1 tablet (40 mg total) by mouth daily.   cetirizine 10 MG tablet Commonly known as: ZYRTEC Take 10 mg by mouth daily as needed  for allergies.   cyanocobalamin 1000 MCG tablet Take 1 tablet (1,000 mcg total) by mouth daily.   donepezil 5 MG tablet Commonly known as: ARICEPT Take 1 tablet (5 mg total) by mouth daily. Take for 4 weeks, then increase to 10 mg daily   donepezil 10 MG tablet Commonly known as: ARICEPT Take 1 tablet (10 mg total) by mouth daily.   Emgality 120 MG/ML Soaj Generic drug: Galcanezumab-gnlm Inject 120 mg into the skin every 30 (thirty) days.   fluticasone 50 MCG/ACT nasal spray Commonly known as: FLONASE Place 2 sprays into both nostrils daily as needed for allergies or rhinitis.   glipiZIDE 10 MG 24 hr tablet Commonly known as: GLUCOTROL XL Take 10 mg by mouth 2 (two) times daily.   hydrochlorothiazide 12.5 MG capsule Commonly known as: MICROZIDE Take 12.5 mg by mouth daily.   Jardiance 25 MG Tabs tablet Generic drug: empagliflozin Take 25 mg by mouth daily.   losartan 25 MG tablet Commonly known as: COZAAR Take 25 mg by mouth daily.   Nurtec 75 MG Tbdp Generic drug: Rimegepant Sulfate Take 1 tablet (75 mg total) by mouth as needed (migraine). Max dose 1 pill in 24 hours   omeprazole 20 MG tablet Commonly known as: PRILOSEC OTC  Take 20 mg by mouth daily as needed (acid reflux).   ondansetron 4 MG tablet Commonly known as: Zofran Take 1 tablet (4 mg total) by mouth daily as needed for nausea or vomiting.   oxyCODONE-acetaminophen 5-325 MG tablet Commonly known as: Percocet Take 1 tablet by mouth every 4 (four) hours as needed for severe pain.   PARoxetine 30 MG tablet Commonly known as: PAXIL Take 60 mg by mouth daily.   phenazopyridine 100 MG tablet Commonly known as: PYRIDIUM Take 1 tablet (100 mg total) by mouth 3 (three) times daily as needed for pain.   pyridoxine 100 MG tablet Commonly known as: B-6 Take 1 tablet (100 mg total) by mouth daily.   QUEtiapine 100 MG tablet Commonly known as: SEROQUEL Take 100 mg by mouth at bedtime.   sodium  bicarbonate 650 MG tablet Take 1 tablet (650 mg total) by mouth 2 (two) times daily.   tamsulosin 0.4 MG Caps capsule Commonly known as: FLOMAX Take 1 capsule (0.4 mg total) by mouth daily after supper.   traMADol 50 MG tablet Commonly known as: Ultram Take 1 tablet (50 mg total) by mouth every 6 (six) hours as needed.   traZODone 100 MG tablet Commonly known as: DESYREL Take 100 mg by mouth daily.   Trulicity 1.5 MG/0.5ML Sopn Generic drug: Dulaglutide Inject 1.5 mg into the skin every Wednesday.   Vitamin D (Ergocalciferol) 1.25 MG (50000 UNIT) Caps capsule Commonly known as: DRISDOL Take 1 capsule (50,000 Units total) by mouth every 7 (seven) days.        Allergies:  Allergies  Allergen Reactions   Penicillins Rash    Family History: Family History  Problem Relation Age of Onset   Hypertension Mother    Hypothyroidism Mother    Heart attack Father    Cancer Sister     Social History:  reports that he has quit smoking. His smoking use included cigarettes. He has never used smokeless tobacco. He reports that he does not drink alcohol and does not use drugs.  ROS: All other review of systems were reviewed and are negative except what is noted above in HPI  Physical Exam: BP 109/77   Pulse 94   Constitutional:  Alert and oriented, No acute distress. HEENT:  AT, moist mucus membranes.  Trachea midline, no masses. Cardiovascular: No clubbing, cyanosis, or edema. Respiratory: Normal respiratory effort, no increased work of breathing. GI: Abdomen is soft, nontender, nondistended, no abdominal masses GU: No CVA tenderness.  Lymph: No cervical or inguinal lymphadenopathy. Skin: No rashes, bruises or suspicious lesions. Neurologic: Grossly intact, no focal deficits, moving all 4 extremities. Psychiatric: Normal mood and affect.  Laboratory Data: Lab Results  Component Value Date   WBC 3.4 (L) 09/29/2021   HGB 13.6 09/29/2021   HCT 41.5 09/29/2021   MCV  90.0 09/29/2021   PLT 101 (L) 09/29/2021    Lab Results  Component Value Date   CREATININE 1.19 01/18/2023    No results found for: "PSA"  No results found for: "TESTOSTERONE"  Lab Results  Component Value Date   HGBA1C 7.0 (H) 01/18/2023    Urinalysis    Component Value Date/Time   COLORURINE YELLOW 09/25/2021 1411   APPEARANCEUR Clear 03/17/2023 1407   LABSPEC 1.015 09/25/2021 1411   PHURINE 5.5 09/25/2021 1411   GLUCOSEU 3+ (A) 03/17/2023 1407   HGBUR LARGE (A) 09/25/2021 1411   BILIRUBINUR Negative 03/17/2023 1407   KETONESUR 15 (A) 09/25/2021 1411   PROTEINUR Negative 03/17/2023  1407   PROTEINUR TRACE (A) 09/25/2021 1411   UROBILINOGEN 0.2 02/17/2008 1558   NITRITE Negative 03/17/2023 1407   NITRITE NEGATIVE 09/25/2021 1411   LEUKOCYTESUR Trace (A) 03/17/2023 1407   LEUKOCYTESUR NEGATIVE 09/25/2021 1411    Lab Results  Component Value Date   LABMICR See below: 03/17/2023   WBCUA 0-5 03/17/2023   LABEPIT 0-10 03/17/2023   BACTERIA None seen 03/17/2023    Pertinent Imaging: CT 05/03/2023: Images reviewed and discussed with the patient  No results found for this or any previous visit.  No results found for this or any previous visit.  No results found for this or any previous visit.  No results found for this or any previous visit.  No results found for this or any previous visit.  No valid procedures specified. Results for orders placed during the hospital encounter of 12/04/22  CT HEMATURIA WORKUP  Narrative CLINICAL DATA:  Gross hematuria * Tracking Code: BO *  EXAM: CT ABDOMEN AND PELVIS WITHOUT AND WITH CONTRAST  TECHNIQUE: Multidetector CT imaging of the abdomen and pelvis was performed following the standard protocol before and following the bolus administration of intravenous contrast.  RADIATION DOSE REDUCTION: This exam was performed according to the departmental dose-optimization program which includes automated exposure control,  adjustment of the mA and/or kV according to patient size and/or use of iterative reconstruction technique.  CONTRAST:  OMNIPAQUE IOHEXOL 350 MG/ML SOLN  COMPARISON:  02/13/2008  FINDINGS: Lower chest: No acute abnormality.  Hepatobiliary: No solid liver abnormality is seen. Coarse, nodular contour of the liver. No gallstones, gallbladder wall thickening, or biliary dilatation.  Pancreas: Unremarkable. No pancreatic ductal dilatation or surrounding inflammatory changes.  Spleen: Splenomegaly, maximum coronal span 16.5 cm.  Adrenals/Urinary Tract: Adrenal glands are unremarkable. Numerous left renal calculi. Heterogeneous, expansile and faintly calcified appearance of the left renal pelvis filling defect measuring 4.9 x 2.9 x 1.8 cm (series 10, image 64, series 7, image 52). The right kidney is normal, without renal calculi, solid lesion, or hydronephrosis. Mild wall thickening of the urinary bladder.  Stomach/Bowel: Stomach is within normal limits. Diverticulum of the descending duodenum. Appendix appears normal. No evidence of bowel wall thickening, distention, or inflammatory changes. Descending and sigmoid diverticulosis.  Vascular/Lymphatic: No significant vascular findings are present. No enlarged abdominal or pelvic lymph nodes.  Reproductive: No mass or other significant abnormality.  Other: No abdominal wall hernia or abnormality. No ascites.  Musculoskeletal: No acute or significant osseous findings.  IMPRESSION: 1. Numerous left renal calculi. 2. Heterogeneous, expansile and faintly calcified appearance of the left renal pelvis filling defect measuring 4.9 x 2.9 x 1.8 cm. This may reflect a large, partially calcified staghorn calculus but appearance is somewhat concerning for urothelial malignancy. 3. No evidence of lymphadenopathy or metastatic disease in the abdomen or pelvis. 4. Mild wall thickening of the urinary bladder, likely related to chronic  outlet obstruction 5. Cirrhotic morphology of the liver.  Splenomegaly. 6. Descending and sigmoid diverticulosis without evidence of acute diverticulitis.  These results will be called to the ordering clinician or representative by the Radiologist Assistant, and communication documented in the PACS or [employer].   Electronically Signed By: Jearld Lesch M.D. On: 12/05/2022 17:04  Results for orders placed during the hospital encounter of 02/19/23  CT RENAL STONE STUDY  Narrative CLINICAL DATA:  Urolithiasis symptomatic.  EXAM: CT ABDOMEN AND PELVIS WITHOUT CONTRAST  TECHNIQUE: Multidetector CT imaging of the abdomen and pelvis was performed following the standard protocol  without IV contrast.  RADIATION DOSE REDUCTION: This exam was performed according to the departmental dose-optimization program which includes automated exposure control, adjustment of the mA and/or kV according to patient size and/or use of iterative reconstruction technique.  COMPARISON:  CT December 04, 2022  FINDINGS: Lower chest: No acute abnormality.  Hepatobiliary: Cirrhotic hepatic morphology. Gallbladder is unremarkable. No biliary ductal dilation.  Pancreas: No pancreatic ductal dilation or evidence of acute inflammation. Periampullary duodenal diverticulum.  Spleen: Calcified splenic granuloma. Similar mild splenomegaly measuring 15.8 cm in maximum craniocaudal dimension.  Adrenals/Urinary Tract: Bilateral adrenal glands appear normal.  Interval placement of a left double-J ureteral stent with pigtail in the renal pelvis and urinary bladder. Decrease in size and number of the faintly mineralized stones in the left renal pelvis measuring up to 13 mm. Additional densely mineralized nonobstructive renal stones in the left kidney measure up to 8 mm. No bladder calculus identified.  Stomach/Bowel: No radiopaque enteric contrast material was administered. Stomach is minimally  distended by ingested material and gas. No pathologic dilation of small or large bowel. Normal appendix. Colonic diverticulosis without findings of acute diverticulitis.  Vascular/Lymphatic: Normal caliber abdominal aorta. Smooth IVC contours. Retroaortic left renal vein. No pathologically enlarged abdominal or pelvic lymph nodes.  Reproductive: Prostate is unremarkable. Calcifications along the dorsal aspect of the penis are similar prior may reflect sequela of Peyronie's disease.  Other: No significant abdominopelvic free fluid.  Musculoskeletal: Similar mild compression deformity of the L1 vertebral body. Probable vertebral body hemangioma at L1 is unchanged dating back to Feb 13, 2008. Multilevel degenerative change of the spine with marked discogenic disease at L5-S1 producing spinal canal narrowing and bilateral neural foraminal impingement.  IMPRESSION: 1. Interval placement of a left double-J ureteral stent with decrease in size and number of the faintly mineralized stones in the left renal pelvis measuring up to 13 mm. Additional densely mineralized nonobstructive renal stones in the left kidney measure up to 8 mm. No bladder calculus identified. 2. Cirrhotic hepatic morphology with splenomegaly indicative of portal venous hypertension. 3. Colonic diverticulosis without findings of acute diverticulitis.   Electronically Signed By: Maudry Mayhew M.D. On: 02/21/2023 09:01   Assessment & Plan:    1. Nephrolithiasis -We discussed the management of kidney stones. These options include observation, ureteroscopy, shockwave lithotripsy (ESWL) and percutaneous nephrolithotomy (PCNL). We discussed which options are relevant to the patient's stone(s). We discussed the natural history of kidney stones as well as the complications of untreated stones and the impact on quality of life without treatment as well as with each of the above listed treatments. We also discussed the  efficacy of each treatment in its ability to clear the stone burden. With any of these management options I discussed the signs and symptoms of infection and the need for emergent treatment should these be experienced. For each option we discussed the ability of each procedure to clear the patient of their stone burden.   For observation I described the risks which include but are not limited to silent renal damage, life-threatening infection, need for emergent surgery, failure to pass stone and pain.   For ureteroscopy I described the risks which include bleeding, infection, damage to contiguous structures, positioning injury, ureteral stricture, ureteral avulsion, ureteral injury, need for prolonged ureteral stent, inability to perform ureteroscopy, need for an interval procedure, inability to clear stone burden, stent discomfort/pain, heart attack, stroke, pulmonary embolus and the inherent risks with general anesthesia.   For shockwave lithotripsy I described the  risks which include arrhythmia, kidney contusion, kidney hemorrhage, need for transfusion, pain, inability to adequately break up stone, inability to pass stone fragments, Steinstrasse, infection associated with obstructing stones, need for alternate surgical procedure, need for repeat shockwave lithotripsy, MI, CVA, PE and the inherent risks with anesthesia/conscious sedation.   For PCNL I described the risks including positioning injury, pneumothorax, hydrothorax, need for chest tube, inability to clear stone burden, renal laceration, arterial venous fistula or malformation, need for embolization of kidney, loss of kidney or renal function, need for repeat procedure, need for prolonged nephrostomy tube, ureteral avulsion, MI, CVA, PE and the inherent risks of general anesthesia.   - The patient would like to proceed with left ureteroscopic stone extraction - Urinalysis, Routine w reflex microscopic   No follow-ups on file.  Wilkie Aye, MD  [facility] [place]

## 2023-05-10 ENCOUNTER — Encounter: Payer: Self-pay | Admitting: Urology

## 2023-05-11 ENCOUNTER — Encounter (INDEPENDENT_AMBULATORY_CARE_PROVIDER_SITE_OTHER): Payer: Self-pay | Admitting: *Deleted

## 2023-05-18 ENCOUNTER — Encounter (INDEPENDENT_AMBULATORY_CARE_PROVIDER_SITE_OTHER): Payer: Self-pay | Admitting: Gastroenterology

## 2023-05-18 ENCOUNTER — Ambulatory Visit (INDEPENDENT_AMBULATORY_CARE_PROVIDER_SITE_OTHER): Payer: BC Managed Care – PPO | Admitting: Gastroenterology

## 2023-05-18 VITALS — BP 120/85 | HR 84 | Temp 98.0°F | Ht 76.0 in | Wt 266.0 lb

## 2023-05-18 DIAGNOSIS — Z1211 Encounter for screening for malignant neoplasm of colon: Secondary | ICD-10-CM | POA: Diagnosis not present

## 2023-05-18 DIAGNOSIS — K766 Portal hypertension: Secondary | ICD-10-CM

## 2023-05-18 DIAGNOSIS — K7460 Unspecified cirrhosis of liver: Secondary | ICD-10-CM | POA: Insufficient documentation

## 2023-05-18 DIAGNOSIS — Z6832 Body mass index (BMI) 32.0-32.9, adult: Secondary | ICD-10-CM | POA: Diagnosis not present

## 2023-05-18 DIAGNOSIS — Z1159 Encounter for screening for other viral diseases: Secondary | ICD-10-CM

## 2023-05-18 MED ORDER — PEG 3350-KCL-NA BICARB-NACL 420 G PO SOLR: 4000.0000 mL | Freq: Once | ORAL | 0 refills | Status: AC

## 2023-05-18 NOTE — Progress Notes (Signed)
Malik Hill , M.D. Gastroenterology & Hepatology [facility] Gastroenterology [address] [place], [place] [address] Primary Care Physician: Malik Pandy, MD [address] [place] [address]  Chief Complaint: Newly diagnosed cirrhosis and colorectal cancer screening  History of Present Illness: Malik Hill is a 62 y.o. male with hypertension hyperlipidemia diabetes history of stroke 2022 ,obstructive uropathy status post stent placement who presents for evaluation of Newly diagnosed cirrhosis and colorectal cancer screening  Patient reports that he has never been a heavy drinker used to draining occasionally in his 62s have not had a drink since that.  Denies any herbal medications or any new supplementation.  Denies any generalized yellowing of the skin or distention of the abdomen  Patient had colonoscopy many years ago and is due for 1 now.  The patient denies having any nausea, vomiting, fever, chills, hematochezia, melena, hematemesis, abdominal distention, abdominal pain, diarrhea, jaundice, pruritus or weight loss.  Patient is on atenolol Patient is 110 CT July 29 with cirrhotic liver  Last EXB:MWUX Last Colonoscopy:Over 10 years ago   FHx: neg for any gastrointestinal/liver disease, no malignancies Social: neg smoking, alcohol or illicit drug use Surgical: no abdominal surgeries Cystoscopy and stent placement  Past Medical History: Past Medical History:  Diagnosis Date   Anxiety    BPH (benign prostatic hyperplasia)    Depression    Diabetes mellitus without complication (HCC)    Headache    Kidney stones    MDD (major depressive disorder)    Memory loss    Osteoarthritis    Stroke (HCC) 09/2021    Past Surgical History: Past Surgical History:  Procedure Laterality Date   BUNIONECTOMY     COLONOSCOPY  06/09/2007   CYSTOSCOPY WITH RETROGRADE PYELOGRAM, URETEROSCOPY AND STENT PLACEMENT Left 01/21/2023    Procedure: CYSTOSCOPY WITH RETROGRADE PYELOGRAM, URETEROSCOPY AND STENT PLACEMENT;  Surgeon: Malen Gauze, MD;  Location: [facility];  Service: Urology;  Laterality: Left;   KNEE SURGERY Left 2015   PENILE ADHESIONS LYSIS     TONSILLECTOMY AND ADENOIDECTOMY     age 5    Family History: Family History  Problem Relation Age of Onset   Hypertension Mother    Hypothyroidism Mother    Heart attack Father    Cancer Sister     Social History: Social History   Tobacco Use  Smoking Status Former   Types: Cigarettes  Smokeless Tobacco Never   Social History   Substance and Sexual Activity  Alcohol Use Never   Social History   Substance and Sexual Activity  Drug Use No    Allergies: Allergies  Allergen Reactions   Penicillins Rash    Medications: Current Outpatient Medications  Medication Sig Dispense Refill   acetaminophen (TYLENOL) 500 MG tablet Take 1,000 mg by mouth every 6 (six) hours as needed for moderate pain or headache.     ALPRAZolam (XANAX) 1 MG tablet Take 1 mg by mouth 4 (four) times daily as needed.     atenolol (TENORMIN) 25 MG tablet Take 25 mg by mouth 2 (two) times daily.     cetirizine (ZYRTEC) 10 MG tablet Take 10 mg by mouth daily as needed for allergies.     fluticasone (FLONASE) 50 MCG/ACT nasal spray Place 2 sprays into both nostrils daily as needed for allergies or rhinitis.     glipiZIDE (GLUCOTROL XL) 10 MG 24 hr tablet Take 10 mg by mouth 2 (two) times daily.  hydrochlorothiazide (MICROZIDE) 12.5 MG capsule Take 12.5 mg by mouth daily.  3   JARDIANCE 25 MG TABS tablet Take 25 mg by mouth daily.     losartan (COZAAR) 25 MG tablet Take 25 mg by mouth daily.  6   omeprazole (PRILOSEC OTC) 20 MG tablet Take 20 mg by mouth daily as needed (acid reflux).     ondansetron (ZOFRAN) 4 MG tablet Take 1 tablet (4 mg total) by mouth daily as needed for nausea or vomiting. 30 tablet 1   PARoxetine (PAXIL) 40 MG tablet Take 40 mg by mouth daily.      pyridOXINE (B-6) 100 MG tablet Take 1 tablet (100 mg total) by mouth daily. 30 tablet 5   QUEtiapine (SEROQUEL) 100 MG tablet Take 100 mg by mouth at bedtime.     rosuvastatin (CRESTOR) 5 MG tablet Take 5 mg by mouth. Takes one every other day     sodium bicarbonate 650 MG tablet Take 1 tablet (650 mg total) by mouth 2 (two) times daily. 60 tablet 11   tamsulosin (FLOMAX) 0.4 MG CAPS capsule Take 1 capsule (0.4 mg total) by mouth daily after supper. 30 capsule 11   traZODone (DESYREL) 100 MG tablet Take 100 mg by mouth daily.     TRULICITY 1.5 MG/0.5ML SOPN Inject 1.5 mg into the skin every Wednesday.     vitamin B-12 1000 MCG tablet Take 1 tablet (1,000 mcg total) by mouth daily. 30 tablet 5   Vitamin D, Ergocalciferol, (DRISDOL) 1.25 MG (50000 UNIT) CAPS capsule Take 1 capsule (50,000 Units total) by mouth every 7 (seven) days. 12 capsule 0   No current facility-administered medications for this visit.    Review of Systems: GENERAL: negative for malaise, night sweats HEENT: No changes in hearing or vision, no nose bleeds or other nasal problems. NECK: Negative for lumps, goiter, pain and significant neck swelling RESPIRATORY: Negative for cough, wheezing CARDIOVASCULAR: Negative for chest pain, leg swelling, palpitations, orthopnea GI: SEE HPI MUSCULOSKELETAL: Negative for joint pain or swelling, back pain, and muscle pain. SKIN: Negative for lesions, rash HEMATOLOGY Negative for prolonged bleeding, bruising easily, and swollen nodes. ENDOCRINE: Negative for cold or heat intolerance, polyuria, polydipsia and goiter. NEURO: negative for tremor, gait imbalance, syncope and seizures. The remainder of the review of systems is noncontributory.   Physical Exam: BP 120/85 (BP Location: Left Arm, Patient Position: Sitting, Cuff Size: Large)   Pulse 84   Temp 98 F (36.7 C) (Oral)   Ht 6\' 4"  (1.93 m)   Wt 266 lb (120.7 kg)   BMI 32.38 kg/m  GENERAL: The patient is AO x3, in no acute  distress. HEENT: Head is normocephalic and atraumatic. EOMI are intact. Mouth is well hydrated and without lesions. NECK: Supple. No masses LUNGS: Clear to auscultation. No presence of rhonchi/wheezing/rales. Adequate chest expansion HEART: RRR, normal s1 and s2. ABDOMEN: Soft, nontender, no guarding, no peritoneal signs, and nondistended. BS +. No masses. EXTREMITIES: Without any cyanosis, clubbing, rash, lesions or edema. NEUROLOGIC: AOx3, no focal motor deficit. SKIN: no jaundice, no rashes   Imaging/Labs: as above  I personally reviewed and interpreted the available labs, imaging and endoscopic files.  Impression and Plan:   Malik Hill is a 62 y.o. 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  He is on atenolol which will be a prophylaxis against varices, although carvedilol is much better served  # HCC screening Last CT scan without any focal lesion no AFP on file  Recommendation   -Will obtain viral hepatitis profile and autoimmune hepatitis and cholangiopathy to assess the etiology of cirrhosis.  If unremarkable then most likely due to MASLD -Given clinically significant portal hypertension will obtain one-time upper endoscopy to evaluate for degree of varices - Check CBC, MELD labs and AFP - Schedule liver US now and every 6 months - Reduce salt intake to <2  g per day - Can take Tylenol max of 2 g per day (650 mg q8h) for pain - Avoid NSAIDs for pain - Avoid eating raw oysters/shellfish - Protein shake (Ensure or Boost) every night before going to sleep -Patient is on hydrochlorothiazide and can continue with -Can continue atenolol which we will be preventative in terms of underlying portal hypertension.  If varices is encountered would benefit from switching to carvedilol -Patient advised to follow-up with PCP to stay up-to-date to all recommended vaccinations -Obtain DEXA scan  #CRC Screening Last colonoscopy over 10 years ago, will schedule for colorectal cancer screening colonoscopy  #BMI:32     - walking at a brisk pace/biking at moderate intesity 2.5-5 hours per week     - use pedometer/step counter to track activity     - goal to lose 5-10% of initial body weight     - avoid suagry drinks and juices, use [facility] calorie beverages     - increase water intake     - eat a low carb diet with plenty of veggies and fruit     - Get sufficient sleep 7-8 hrs nightly     - maitain active lifestyle     - avoid alcohol     - recommend 2-3 cups Coffee daily     - Counsel on lowering cholesterol by having a diet rich in vegetables,          protein (avoid red meats) and good fats(fish, salmon).   Orders: - Protime-INR - Iron, TIBC and Ferritin Panel - Mitochondrial antibodies - US Abdomen Complete; Future - Comprehensive metabolic panel - Hepatitis A antibody, total - Hepatitis B core antibody, total - Hepatitis B surface antibody,qualitative - Hepatitis B surface antigen - Hepatitis C antibody - HIV Antibody (routine testing w rflx) - Anti-smooth muscle antibody, IgG - AFP tumor marker - Antinuclear Antib (ANA)   All questions were answered.      Malik Lawman, MD Gastroenterology and Hepatology [facility] Gastroenterology   This chart has been completed using [facility] Dictation software, and while  attempts have been made to ensure accuracy , certain words and phrases may not be transcribed as intended

## 2023-05-18 NOTE — H&P (View-Only) (Signed)
 [name] , M.D. Gastroenterology & Hepatology [facility] Gastroenterology [address] [place], [place] [address] Primary Care Physician: Estanislado Pandy, MD [address] [place] [address]  Chief Complaint: Newly diagnosed cirrhosis and colorectal cancer screening  History of Present Illness: Malik Hill is a 62 y.o. male with hypertension hyperlipidemia diabetes history of stroke 2022 ,obstructive uropathy status post stent placement who presents for evaluation of Newly diagnosed cirrhosis and colorectal cancer screening  Patient reports that he has never been a heavy drinker used to draining occasionally in his 62s have not had a drink since that.  Denies any herbal medications or any new supplementation.  Denies any generalized yellowing of the skin or distention of the abdomen  Patient had colonoscopy many years ago and is due for 1 now.  The patient denies having any nausea, vomiting, fever, chills, hematochezia, melena, hematemesis, abdominal distention, abdominal pain, diarrhea, jaundice, pruritus or weight loss.  Patient is on atenolol Patient is 110 CT July 29 with cirrhotic liver  Last EXB:MWUX Last Colonoscopy:Over 10 years ago   FHx: neg for any gastrointestinal/liver disease, no malignancies Social: neg smoking, alcohol or illicit drug use Surgical: no abdominal surgeries Cystoscopy and stent placement  Past Medical History: Past Medical History:  Diagnosis Date   Anxiety    BPH (benign prostatic hyperplasia)    Depression    Diabetes mellitus without complication (HCC)    Headache    Kidney stones    MDD (major depressive disorder)    Memory loss    Osteoarthritis    Stroke (HCC) 09/2021    Past Surgical History: Past Surgical History:  Procedure Laterality Date   BUNIONECTOMY     COLONOSCOPY  06/09/2007   CYSTOSCOPY WITH RETROGRADE PYELOGRAM, URETEROSCOPY AND STENT PLACEMENT Left 01/21/2023    Procedure: CYSTOSCOPY WITH RETROGRADE PYELOGRAM, URETEROSCOPY AND STENT PLACEMENT;  Surgeon: Malen Gauze, MD;  Location: [facility];  Service: Urology;  Laterality: Left;   KNEE SURGERY Left 2015   PENILE ADHESIONS LYSIS     TONSILLECTOMY AND ADENOIDECTOMY     age 5    Family History: Family History  Problem Relation Age of Onset   Hypertension Mother    Hypothyroidism Mother    Heart attack Father    Cancer Sister     Social History: Social History   Tobacco Use  Smoking Status Former   Types: Cigarettes  Smokeless Tobacco Never   Social History   Substance and Sexual Activity  Alcohol Use Never   Social History   Substance and Sexual Activity  Drug Use No    Allergies: Allergies  Allergen Reactions   Penicillins Rash    Medications: Current Outpatient Medications  Medication Sig Dispense Refill   acetaminophen (TYLENOL) 500 MG tablet Take 1,000 mg by mouth every 6 (six) hours as needed for moderate pain or headache.     ALPRAZolam (XANAX) 1 MG tablet Take 1 mg by mouth 4 (four) times daily as needed.     atenolol (TENORMIN) 25 MG tablet Take 25 mg by mouth 2 (two) times daily.     cetirizine (ZYRTEC) 10 MG tablet Take 10 mg by mouth daily as needed for allergies.     fluticasone (FLONASE) 50 MCG/ACT nasal spray Place 2 sprays into both nostrils daily as needed for allergies or rhinitis.     glipiZIDE (GLUCOTROL XL) 10 MG 24 hr tablet Take 10 mg by mouth 2 (two) times daily.  hydrochlorothiazide (MICROZIDE) 12.5 MG capsule Take 12.5 mg by mouth daily.  3   JARDIANCE 25 MG TABS tablet Take 25 mg by mouth daily.     losartan (COZAAR) 25 MG tablet Take 25 mg by mouth daily.  6   omeprazole (PRILOSEC OTC) 20 MG tablet Take 20 mg by mouth daily as needed (acid reflux).     ondansetron (ZOFRAN) 4 MG tablet Take 1 tablet (4 mg total) by mouth daily as needed for nausea or vomiting. 30 tablet 1   PARoxetine (PAXIL) 40 MG tablet Take 40 mg by mouth daily.      pyridOXINE (B-6) 100 MG tablet Take 1 tablet (100 mg total) by mouth daily. 30 tablet 5   QUEtiapine (SEROQUEL) 100 MG tablet Take 100 mg by mouth at bedtime.     rosuvastatin (CRESTOR) 5 MG tablet Take 5 mg by mouth. Takes one every other day     sodium bicarbonate 650 MG tablet Take 1 tablet (650 mg total) by mouth 2 (two) times daily. 60 tablet 11   tamsulosin (FLOMAX) 0.4 MG CAPS capsule Take 1 capsule (0.4 mg total) by mouth daily after supper. 30 capsule 11   traZODone (DESYREL) 100 MG tablet Take 100 mg by mouth daily.     TRULICITY 1.5 MG/0.5ML SOPN Inject 1.5 mg into the skin every Wednesday.     vitamin B-12 1000 MCG tablet Take 1 tablet (1,000 mcg total) by mouth daily. 30 tablet 5   Vitamin D, Ergocalciferol, (DRISDOL) 1.25 MG (50000 UNIT) CAPS capsule Take 1 capsule (50,000 Units total) by mouth every 7 (seven) days. 12 capsule 0   No current facility-administered medications for this visit.    Review of Systems: GENERAL: negative for malaise, night sweats HEENT: No changes in hearing or vision, no nose bleeds or other nasal problems. NECK: Negative for lumps, goiter, pain and significant neck swelling RESPIRATORY: Negative for cough, wheezing CARDIOVASCULAR: Negative for chest pain, leg swelling, palpitations, orthopnea GI: SEE HPI MUSCULOSKELETAL: Negative for joint pain or swelling, back pain, and muscle pain. SKIN: Negative for lesions, rash HEMATOLOGY Negative for prolonged bleeding, bruising easily, and swollen nodes. ENDOCRINE: Negative for cold or heat intolerance, polyuria, polydipsia and goiter. NEURO: negative for tremor, gait imbalance, syncope and seizures. The remainder of the review of systems is noncontributory.   Physical Exam: BP 120/85 (BP Location: Left Arm, Patient Position: Sitting, Cuff Size: Large)   Pulse 84   Temp 98 F (36.7 C) (Oral)   Ht 6\' 4"  (1.93 m)   Wt 266 lb (120.7 kg)   BMI 32.38 kg/m  GENERAL: The patient is AO x3, in no acute  distress. HEENT: Head is normocephalic and atraumatic. EOMI are intact. Mouth is well hydrated and without lesions. NECK: Supple. No masses LUNGS: Clear to auscultation. No presence of rhonchi/wheezing/rales. Adequate chest expansion HEART: RRR, normal s1 and s2. ABDOMEN: Soft, nontender, no guarding, no peritoneal signs, and nondistended. BS +. No masses. EXTREMITIES: Without any cyanosis, clubbing, rash, lesions or edema. NEUROLOGIC: AOx3, no focal motor deficit. SKIN: no jaundice, no rashes   Imaging/Labs: as above  I personally reviewed and interpreted the available labs, imaging and endoscopic files.  Impression and Plan:   Malik Hill is a 62 y.o. 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  He is on atenolol which will be a prophylaxis against varices, although carvedilol is much better served  # HCC screening Last CT scan without any focal lesion no AFP on file  Recommendation   -Will obtain viral hepatitis profile and autoimmune hepatitis and cholangiopathy to assess the etiology of cirrhosis.  If unremarkable then most likely due to MASLD -Given clinically significant portal hypertension will obtain one-time upper endoscopy to evaluate for degree of varices - Check CBC, MELD labs and AFP - Schedule liver US now and every 6 months - Reduce salt intake to <2  g per day - Can take Tylenol max of 2 g per day (650 mg q8h) for pain - Avoid NSAIDs for pain - Avoid eating raw oysters/shellfish - Protein shake (Ensure or Boost) every night before going to sleep -Patient is on hydrochlorothiazide and can continue with -Can continue atenolol which we will be preventative in terms of underlying portal hypertension.  If varices is encountered would benefit from switching to carvedilol -Patient advised to follow-up with PCP to stay up-to-date to all recommended vaccinations -Obtain DEXA scan  #CRC Screening Last colonoscopy over 10 years ago, will schedule for colorectal cancer screening colonoscopy  #BMI:32     - walking at a brisk pace/biking at moderate intesity 2.5-5 hours per week     - use pedometer/step counter to track activity     - goal to lose 5-10% of initial body weight     - avoid suagry drinks and juices, use [facility] calorie beverages     - increase water intake     - eat a low carb diet with plenty of veggies and fruit     - Get sufficient sleep 7-8 hrs nightly     - maitain active lifestyle     - avoid alcohol     - recommend 2-3 cups Coffee daily     - Counsel on lowering cholesterol by having a diet rich in vegetables,          protein (avoid red meats) and good fats(fish, salmon).   Orders: - Protime-INR - Iron, TIBC and Ferritin Panel - Mitochondrial antibodies - US Abdomen Complete; Future - Comprehensive metabolic panel - Hepatitis A antibody, total - Hepatitis B core antibody, total - Hepatitis B surface antibody,qualitative - Hepatitis B surface antigen - Hepatitis C antibody - HIV Antibody (routine testing w rflx) - Anti-smooth muscle antibody, IgG - AFP tumor marker - Antinuclear Antib (ANA)   All questions were answered.      [name], MD Gastroenterology and Hepatology [facility] Gastroenterology   This chart has been completed using [facility] Dictation software, and while  attempts have been made to ensure accuracy , certain words and phrases may not be transcribed as intended

## 2023-05-18 NOTE — Patient Instructions (Signed)
It was very nice to meet you today, as dicussed with will plan for the following :  1) Labwork and Ultrasound 2) endoscopy and colonoscopy

## 2023-05-18 NOTE — Addendum Note (Signed)
Addended by: Metro Kung on: 05/18/2023 12:37 PM   Modules accepted: Orders

## 2023-05-21 LAB — ANA: Anti Nuclear Antibody (ANA): NEGATIVE

## 2023-05-26 ENCOUNTER — Encounter (INDEPENDENT_AMBULATORY_CARE_PROVIDER_SITE_OTHER): Payer: Self-pay

## 2023-05-27 ENCOUNTER — Encounter: Payer: Self-pay | Admitting: Urology

## 2023-05-27 NOTE — Patient Instructions (Signed)

## 2023-05-31 ENCOUNTER — Ambulatory Visit (HOSPITAL_COMMUNITY)
Admission: RE | Admit: 2023-05-31 | Discharge: 2023-05-31 | Disposition: A | Discharge status: Home / Self Care | Payer: BC Managed Care – PPO | Source: Ambulatory Visit | Attending: Gastroenterology | Admitting: Gastroenterology

## 2023-05-31 DIAGNOSIS — K7460 Unspecified cirrhosis of liver: Secondary | ICD-10-CM | POA: Insufficient documentation

## 2023-05-31 NOTE — Progress Notes (Signed)
Hi Malik Hill ,  Can you please call the patient and tell the patient ultrasound show cirrhotic liver but no signs of liver cancer  Thanks,  [name], MD Gastroenterology and Hepatology [facility] Gastroenterology

## 2023-06-03 NOTE — Patient Instructions (Signed)
Malik Hill  06/03/2023     @PREFPERIOPPHARMACY @   Your procedure is scheduled on  06/10/2023.   Report to [name] at  0745 A.M.   Call this number if you have problems the morning of surgery:  [phone]  If you experience any cold or flu symptoms such as cough, fever, chills, shortness of breath, etc. between now and your scheduled surgery, please notify us at the above number.   Remember:  Follow the diet and prep instructions given to you by the office.      Your last dose of trulicity should have been on 06/02/2023 and your last dose of jardiance should be on 06/06/2023.        DO NOT take any medications for diabetes the morning of your procedure.    Take these medicines the morning of surgery with A SIP OF WATER        xanax(if needed), atenolol, buspar, omeprazole, zofran(if needed), paxil.     Do not wear jewelry, make-up or nail polish, including gel polish,  artificial nails, or any other type of covering on natural nails (fingers and  toes).  Do not wear lotions, powders, or perfumes, or deodorant.  Do not shave 48 hours prior to surgery.  Men may shave face and neck.  Do not bring valuables to the hospital.  [facility] is not responsible for any belongings or valuables.  Contacts, dentures or bridgework may not be worn into surgery.  Leave your suitcase in the car.  After surgery it may be brought to your room.  For patients admitted to the hospital, discharge time will be determined by your treatment team.  Patients discharged the day of surgery will not be allowed to drive home and must have someone with them for 24 hours.    Special instructions:   DO NOT smoke tobacco or vape for 24 hours before your procedure.  Please read over the following fact sheets that you were given. Anesthesia Post-op Instructions and Care and Recovery After Surgery        Upper Endoscopy, Adult, Care After After the procedure, it is common to have a sore  throat. It is also common to have: Mild stomach pain or discomfort. Bloating. Nausea. Follow these instructions at home: The instructions below may help you care for yourself at home. Your health care provider may give you more instructions. If you have questions, ask your health care provider. If you were given a sedative during the procedure, it can affect you for several hours. Do not drive or operate machinery until your health care provider says that it is safe. If you will be going home right after the procedure, plan to have a responsible adult: Take you home from the hospital or clinic. You will not be allowed to drive. Care for you for the time you are told. Follow instructions from your health care provider about what you may eat and drink. Return to your normal activities as told by your health care provider. Ask your health care provider what activities are safe for you. Take over-the-counter and prescription medicines only as told by your health care provider. Contact a health care provider if you: Have a sore throat that lasts longer than one day. Have trouble swallowing. Have a fever. Get help right away if you: Vomit blood or your vomit looks like coffee grounds. Have bloody, black, or tarry stools. Have a very bad sore throat or you cannot swallow.  Have difficulty breathing or very bad pain in your chest or abdomen. These symptoms may be an emergency. Get help right away. Call 911. Do not wait to see if the symptoms will go away. Do not drive yourself to the hospital. Summary After the procedure, it is common to have a sore throat, mild stomach discomfort, bloating, and nausea. If you were given a sedative during the procedure, it can affect you for several hours. Do not drive until your health care provider says that it is safe. Follow instructions from your health care provider about what you may eat and drink. Return to your normal activities as told by your health  care provider. This information is not intended to replace advice given to you by your health care provider. Make sure you discuss any questions you have with your health care provider. Document Revised: 12/31/2021 Document Reviewed: 12/31/2021 Elsevier Patient Education  2024 [employer] Colonoscopy, Adult, Care After The following information offers guidance on how to care for yourself after your procedure. Your health care provider may also give you more specific instructions. If you have problems or questions, contact your health care provider. What can I expect after the procedure? After the procedure, it is common to have: A small amount of blood in your stool for 24 hours after the procedure. Some gas. Mild cramping or bloating of your abdomen. Follow these instructions at home: Eating and drinking  Drink enough fluid to keep your urine pale yellow. Follow instructions from your health care provider about eating or drinking restrictions. Resume your normal diet as told by your health care provider. Avoid heavy or fried foods that are hard to digest. Activity Rest as told by your health care provider. Avoid sitting for a long time without moving. Get up to take short walks every 1-2 hours. This is important to improve blood flow and breathing. Ask for help if you feel weak or unsteady. Return to your normal activities as told by your health care provider. Ask your health care provider what activities are safe for you. Managing cramping and bloating  Try walking around when you have cramps or feel bloated. If directed, apply heat to your abdomen as told by your health care provider. Use the heat source that your health care provider recommends, such as a moist heat pack or a heating pad. Place a towel between your skin and the heat source. Leave the heat on for 20-30 minutes. Remove the heat if your skin turns bright red. This is especially important if you are unable to feel pain,  heat, or cold. You have a greater risk of getting burned. General instructions If you were given a sedative during the procedure, it can affect you for several hours. Do not drive or operate machinery until your health care provider says that it is safe. For the first 24 hours after the procedure: Do not sign important documents. Do not drink alcohol. Do your regular daily activities at a slower pace than normal. Eat soft foods that are easy to digest. Take over-the-counter and prescription medicines only as told by your health care provider. Keep all follow-up visits. This is important. Contact a health care provider if: You have blood in your stool 2-3 days after the procedure. Get help right away if: You have more than a small spotting of blood in your stool. You have large blood clots in your stool. You have swelling of your abdomen. You have nausea or vomiting. You have a fever. You  have increasing pain in your abdomen that is not relieved with medicine. These symptoms may be an emergency. Get help right away. Call 911. Do not wait to see if the symptoms will go away. Do not drive yourself to the hospital. Summary After the procedure, it is common to have a small amount of blood in your stool. You may also have mild cramping and bloating of your abdomen. If you were given a sedative during the procedure, it can affect you for several hours. Do not drive or operate machinery until your health care provider says that it is safe. Get help right away if you have a lot of blood in your stool, nausea or vomiting, a fever, or increased pain in your abdomen. This information is not intended to replace advice given to you by your health care provider. Make sure you discuss any questions you have with your health care provider. Document Revised: 11/03/2022 Document Reviewed: 05/14/2021 Elsevier Patient Education  2024 Elsevier Inc. Monitored Anesthesia Care, Care After The following  information offers guidance on how to care for yourself after your procedure. Your health care provider may also give you more specific instructions. If you have problems or questions, contact your health care provider. What can I expect after the procedure? After the procedure, it is common to have: Tiredness. Little or no memory about what happened during or after the procedure. Impaired judgment when it comes to making decisions. Nausea or vomiting. Some trouble with balance. Follow these instructions at home: For the time period you were told by your health care provider:  Rest. Do not participate in activities where you could fall or become injured. Do not drive or use machinery. Do not drink alcohol. Do not take sleeping pills or medicines that cause drowsiness. Do not make important decisions or sign legal documents. Do not take care of children on your own. Medicines Take over-the-counter and prescription medicines only as told by your health care provider. If you were prescribed antibiotics, take them as told by your health care provider. Do not stop using the antibiotic even if you start to feel better. Eating and drinking Follow instructions from your health care provider about what you may eat and drink. Drink enough fluid to keep your urine pale yellow. If you vomit: Drink clear fluids slowly and in small amounts as you are able. Clear fluids include water, ice chips, low-calorie sports drinks, and fruit juice that has water added to it (diluted fruit juice). Eat light and bland foods in small amounts as you are able. These foods include bananas, applesauce, rice, lean meats, toast, and crackers. General instructions  Have a responsible adult stay with you for the time you are told. It is important to have someone help care for you until you are awake and alert. If you have sleep apnea, surgery and some medicines can increase your risk for breathing problems. Follow  instructions from your health care provider about wearing your sleep device: When you are sleeping. This includes during daytime naps. While taking prescription pain medicines, sleeping medicines, or medicines that make you drowsy. Do not use any products that contain nicotine or tobacco. These products include cigarettes, chewing tobacco, and vaping devices, such as e-cigarettes. If you need help quitting, ask your health care provider. Contact a health care provider if: You feel nauseous or vomit every time you eat or drink. You feel light-headed. You are still sleepy or having trouble with balance after 24 hours. You get a rash.  You have a fever. You have redness or swelling around the IV site. Get help right away if: You have trouble breathing. You have new confusion after you get home. These symptoms may be an emergency. Get help right away. Call 911. Do not wait to see if the symptoms will go away. Do not drive yourself to the hospital. This information is not intended to replace advice given to you by your health care provider. Make sure you discuss any questions you have with your health care provider. Document Revised: 02/16/2022 Document Reviewed: 02/16/2022 Elsevier Patient Education  2024 [employer].

## 2023-06-08 ENCOUNTER — Encounter (HOSPITAL_COMMUNITY): Payer: Self-pay

## 2023-06-08 ENCOUNTER — Encounter (HOSPITAL_COMMUNITY)
Admission: RE | Admit: 2023-06-08 | Discharge: 2023-06-08 | Disposition: A | Discharge status: Home / Self Care | Payer: BC Managed Care – PPO | Source: Ambulatory Visit | Attending: Gastroenterology | Admitting: Gastroenterology

## 2023-06-08 VITALS — BP 120/85 | HR 84 | Temp 98.0°F | Resp 18 | Ht 76.0 in | Wt 266.0 lb

## 2023-06-08 DIAGNOSIS — E119 Type 2 diabetes mellitus without complications: Secondary | ICD-10-CM | POA: Diagnosis not present

## 2023-06-08 DIAGNOSIS — Z7984 Long term (current) use of oral hypoglycemic drugs: Secondary | ICD-10-CM | POA: Diagnosis not present

## 2023-06-08 DIAGNOSIS — K317 Polyp of stomach and duodenum: Secondary | ICD-10-CM | POA: Diagnosis not present

## 2023-06-08 DIAGNOSIS — I868 Varicose veins of other specified sites: Secondary | ICD-10-CM | POA: Diagnosis not present

## 2023-06-08 DIAGNOSIS — K3189 Other diseases of stomach and duodenum: Secondary | ICD-10-CM | POA: Diagnosis not present

## 2023-06-08 DIAGNOSIS — I8510 Secondary esophageal varices without bleeding: Secondary | ICD-10-CM | POA: Diagnosis not present

## 2023-06-08 DIAGNOSIS — D123 Benign neoplasm of transverse colon: Secondary | ICD-10-CM | POA: Diagnosis not present

## 2023-06-08 DIAGNOSIS — D125 Benign neoplasm of sigmoid colon: Secondary | ICD-10-CM | POA: Diagnosis not present

## 2023-06-08 DIAGNOSIS — K5730 Diverticulosis of large intestine without perforation or abscess without bleeding: Secondary | ICD-10-CM | POA: Diagnosis not present

## 2023-06-08 DIAGNOSIS — Z01812 Encounter for preprocedural laboratory examination: Secondary | ICD-10-CM | POA: Insufficient documentation

## 2023-06-08 DIAGNOSIS — K644 Residual hemorrhoidal skin tags: Secondary | ICD-10-CM | POA: Diagnosis not present

## 2023-06-08 DIAGNOSIS — I10 Essential (primary) hypertension: Secondary | ICD-10-CM | POA: Diagnosis not present

## 2023-06-08 DIAGNOSIS — K7460 Unspecified cirrhosis of liver: Secondary | ICD-10-CM | POA: Insufficient documentation

## 2023-06-08 DIAGNOSIS — Z1211 Encounter for screening for malignant neoplasm of colon: Secondary | ICD-10-CM | POA: Diagnosis present

## 2023-06-08 DIAGNOSIS — D122 Benign neoplasm of ascending colon: Secondary | ICD-10-CM | POA: Diagnosis not present

## 2023-06-08 DIAGNOSIS — K449 Diaphragmatic hernia without obstruction or gangrene: Secondary | ICD-10-CM | POA: Diagnosis not present

## 2023-06-08 DIAGNOSIS — D124 Benign neoplasm of descending colon: Secondary | ICD-10-CM | POA: Diagnosis not present

## 2023-06-08 DIAGNOSIS — Z7985 Long-term (current) use of injectable non-insulin antidiabetic drugs: Secondary | ICD-10-CM | POA: Diagnosis not present

## 2023-06-08 DIAGNOSIS — K2970 Gastritis, unspecified, without bleeding: Secondary | ICD-10-CM | POA: Diagnosis not present

## 2023-06-08 DIAGNOSIS — K648 Other hemorrhoids: Secondary | ICD-10-CM | POA: Diagnosis not present

## 2023-06-08 HISTORY — DX: Personal history of urinary calculi: Z87.442

## 2023-06-08 HISTORY — DX: Unspecified cirrhosis of liver: K74.60

## 2023-06-08 LAB — CBC WITH DIFFERENTIAL/PLATELET
Abs Immature Granulocytes: 0.02 10*3/uL (ref 0.00–0.07)
Basophils Absolute: 0 10*3/uL (ref 0.0–0.1)
Basophils Relative: 0 %
Eosinophils Absolute: 0.1 10*3/uL (ref 0.0–0.5)
Eosinophils Relative: 1 %
HCT: 46.7 % (ref 39.0–52.0)
Hemoglobin: 15.1 g/dL (ref 13.0–17.0)
Immature Granulocytes: 1 %
Lymphocytes Relative: 40 %
Lymphs Abs: 1.7 10*3/uL (ref 0.7–4.0)
MCH: 28.2 pg (ref 26.0–34.0)
MCHC: 32.3 g/dL (ref 30.0–36.0)
MCV: 87.1 fL (ref 80.0–100.0)
Monocytes Absolute: 0.4 10*3/uL (ref 0.1–1.0)
Monocytes Relative: 9 %
Neutro Abs: 2.2 10*3/uL (ref 1.7–7.7)
Neutrophils Relative %: 49 %
Platelets: 128 10*3/uL — ABNORMAL LOW (ref 150–400)
RBC: 5.36 MIL/uL (ref 4.22–5.81)
RDW: 13.7 % (ref 11.5–15.5)
WBC: 4.4 10*3/uL (ref 4.0–10.5)
nRBC: 0 % (ref 0.0–0.2)

## 2023-06-10 ENCOUNTER — Encounter (HOSPITAL_COMMUNITY): Payer: Self-pay

## 2023-06-10 ENCOUNTER — Ambulatory Visit (HOSPITAL_COMMUNITY): Payer: BC Managed Care – PPO | Admitting: Anesthesiology

## 2023-06-10 ENCOUNTER — Ambulatory Visit (HOSPITAL_COMMUNITY)
Admission: RE | Admit: 2023-06-10 | Discharge: 2023-06-10 | Disposition: A | Discharge status: Home / Self Care | Payer: BC Managed Care – PPO | Attending: Gastroenterology | Admitting: Gastroenterology

## 2023-06-10 ENCOUNTER — Encounter (HOSPITAL_COMMUNITY)
Admission: RE | Disposition: A | Discharge status: Home / Self Care | Payer: Self-pay | Source: Home / Self Care | Attending: Gastroenterology

## 2023-06-10 DIAGNOSIS — Z7984 Long term (current) use of oral hypoglycemic drugs: Secondary | ICD-10-CM | POA: Insufficient documentation

## 2023-06-10 DIAGNOSIS — Z1211 Encounter for screening for malignant neoplasm of colon: Secondary | ICD-10-CM | POA: Diagnosis not present

## 2023-06-10 DIAGNOSIS — K2970 Gastritis, unspecified, without bleeding: Secondary | ICD-10-CM | POA: Diagnosis not present

## 2023-06-10 DIAGNOSIS — K317 Polyp of stomach and duodenum: Secondary | ICD-10-CM | POA: Diagnosis not present

## 2023-06-10 DIAGNOSIS — I868 Varicose veins of other specified sites: Secondary | ICD-10-CM | POA: Insufficient documentation

## 2023-06-10 DIAGNOSIS — D123 Benign neoplasm of transverse colon: Secondary | ICD-10-CM | POA: Diagnosis not present

## 2023-06-10 DIAGNOSIS — I8510 Secondary esophageal varices without bleeding: Secondary | ICD-10-CM | POA: Insufficient documentation

## 2023-06-10 DIAGNOSIS — K648 Other hemorrhoids: Secondary | ICD-10-CM | POA: Insufficient documentation

## 2023-06-10 DIAGNOSIS — D122 Benign neoplasm of ascending colon: Secondary | ICD-10-CM | POA: Insufficient documentation

## 2023-06-10 DIAGNOSIS — K449 Diaphragmatic hernia without obstruction or gangrene: Secondary | ICD-10-CM | POA: Insufficient documentation

## 2023-06-10 DIAGNOSIS — K644 Residual hemorrhoidal skin tags: Secondary | ICD-10-CM | POA: Insufficient documentation

## 2023-06-10 DIAGNOSIS — K3189 Other diseases of stomach and duodenum: Secondary | ICD-10-CM | POA: Insufficient documentation

## 2023-06-10 DIAGNOSIS — D124 Benign neoplasm of descending colon: Secondary | ICD-10-CM | POA: Diagnosis not present

## 2023-06-10 DIAGNOSIS — I10 Essential (primary) hypertension: Secondary | ICD-10-CM | POA: Insufficient documentation

## 2023-06-10 DIAGNOSIS — E119 Type 2 diabetes mellitus without complications: Secondary | ICD-10-CM | POA: Insufficient documentation

## 2023-06-10 DIAGNOSIS — K5730 Diverticulosis of large intestine without perforation or abscess without bleeding: Secondary | ICD-10-CM | POA: Insufficient documentation

## 2023-06-10 DIAGNOSIS — D125 Benign neoplasm of sigmoid colon: Secondary | ICD-10-CM | POA: Diagnosis not present

## 2023-06-10 DIAGNOSIS — Z7985 Long-term (current) use of injectable non-insulin antidiabetic drugs: Secondary | ICD-10-CM | POA: Insufficient documentation

## 2023-06-10 DIAGNOSIS — K7460 Unspecified cirrhosis of liver: Secondary | ICD-10-CM | POA: Insufficient documentation

## 2023-06-10 HISTORY — PX: POLYPECTOMY: SHX5525

## 2023-06-10 HISTORY — PX: ESOPHAGOGASTRODUODENOSCOPY (EGD) WITH PROPOFOL: SHX5813

## 2023-06-10 HISTORY — PX: BIOPSY: SHX5522

## 2023-06-10 HISTORY — PX: COLONOSCOPY WITH PROPOFOL: SHX5780

## 2023-06-10 LAB — HM COLONOSCOPY

## 2023-06-10 LAB — GLUCOSE, CAPILLARY
Glucose-Capillary: 149 mg/dL — ABNORMAL HIGH (ref 70–99)
Glucose-Capillary: 148 mg/dL — ABNORMAL HIGH (ref 70–99)

## 2023-06-10 SURGERY — COLONOSCOPY WITH PROPOFOL
Anesthesia: General

## 2023-06-10 MED ORDER — PHENYLEPHRINE 80 MCG/ML (10ML) SYRINGE FOR IV PUSH (FOR BLOOD PRESSURE SUPPORT)
PREFILLED_SYRINGE | INTRAVENOUS | Status: AC
  Filled 2023-06-10: qty 10

## 2023-06-10 MED ORDER — EPHEDRINE 5 MG/ML INJ
INTRAVENOUS | Status: AC
  Filled 2023-06-10: qty 5

## 2023-06-10 MED ORDER — PROPOFOL 10 MG/ML IV BOLUS
INTRAVENOUS | Status: DC | PRN
Start: 2023-06-10 — End: 2023-06-10
  Administered 2023-06-10 (×2): 50 mg via INTRAVENOUS
  Administered 2023-06-10: 100 mg via INTRAVENOUS
  Administered 2023-06-10 (×2): 50 mg via INTRAVENOUS

## 2023-06-10 MED ORDER — LIDOCAINE HCL (CARDIAC) PF 100 MG/5ML IV SOSY
PREFILLED_SYRINGE | INTRAVENOUS | Status: DC | PRN
  Administered 2023-06-10: 50 mg via INTRAVENOUS

## 2023-06-10 MED ORDER — DEXMEDETOMIDINE HCL IN NACL 80 MCG/20ML IV SOLN
INTRAVENOUS | Status: AC
  Filled 2023-06-10: qty 20

## 2023-06-10 MED ORDER — LACTATED RINGERS IV SOLN: INTRAVENOUS | Status: DC

## 2023-06-10 MED ORDER — PHENYLEPHRINE 80 MCG/ML (10ML) SYRINGE FOR IV PUSH (FOR BLOOD PRESSURE SUPPORT)
PREFILLED_SYRINGE | INTRAVENOUS | Status: DC | PRN
Start: 2023-06-10 — End: 2023-06-10
  Administered 2023-06-10 (×4): 160 ug via INTRAVENOUS

## 2023-06-10 MED ORDER — PROPOFOL 500 MG/50ML IV EMUL
INTRAVENOUS | Status: DC | PRN
  Administered 2023-06-10: 150 ug/kg/min via INTRAVENOUS

## 2023-06-10 MED ORDER — DEXMEDETOMIDINE HCL IN NACL 80 MCG/20ML IV SOLN
INTRAVENOUS | Status: DC | PRN
Start: 2023-06-10 — End: 2023-06-10
  Administered 2023-06-10: 8 ug via INTRAVENOUS

## 2023-06-10 MED ORDER — EPHEDRINE SULFATE-NACL 50-0.9 MG/10ML-% IV SOSY
PREFILLED_SYRINGE | INTRAVENOUS | Status: DC | PRN
Start: 2023-06-10 — End: 2023-06-10
  Administered 2023-06-10: 5 mg via INTRAVENOUS

## 2023-06-10 NOTE — Discharge Instructions (Signed)
  Discharge instructions Please read the instructions outlined below and refer to this sheet in the next few weeks. These discharge instructions provide you with general information on caring for yourself after you leave the hospital. Your doctor may also give you specific instructions. While your treatment has been planned according to the most current medical practices available, unavoidable complications occasionally occur. If you have any problems or questions after discharge, please call your doctor. ACTIVITY You may resume your regular activity but move at a slower pace for the next 24 hours.  Take frequent rest periods for the next 24 hours.  Walking will help expel (get rid of) the air and reduce the bloated feeling in your abdomen.  No driving for 24 hours (because of the anesthesia (medicine) used during the test).  You may shower.  Do not sign any important legal documents or operate any machinery for 24 hours (because of the anesthesia used during the test).  NUTRITION Drink plenty of fluids.  You may resume your normal diet.  Begin with a light meal and progress to your normal diet.  Avoid alcoholic beverages for 24 hours or as instructed by your caregiver.  MEDICATIONS You may resume your normal medications unless your caregiver tells you otherwise.  WHAT YOU CAN EXPECT TODAY You may experience abdominal discomfort such as a feeling of fullness or "gas" pains.  FOLLOW-UP Your doctor will discuss the results of your test with you.  SEEK IMMEDIATE MEDICAL ATTENTION IF ANY OF THE FOLLOWING OCCUR: Excessive nausea (feeling sick to your stomach) and/or vomiting.  Severe abdominal pain and distention (swelling).  Trouble swallowing.  Temperature over 101 F (37.8 C).  Rectal bleeding or vomiting of blood.      -consider switching atenolol to carvedilol twice daily , talk to your pcp - Avoid NSAIDs for atleast 10 days  I hope you have a great rest of your week!   [name] , M.D.. Gastroenterology and Hepatology [facility]

## 2023-06-10 NOTE — Op Note (Signed)
[facility] Patient Name: Malik Hill Procedure Date: 06/10/2023 8:24 AM MRN: [id number] Date of Birth: 1961/01/30 Attending MD: Sanjuan Dame , MD, [id number] CSN: [id number] Age: 62 Admit Type: Outpatient Procedure:                Colonoscopy Indications:              Screening for colorectal malignant neoplasm Providers:                Sanjuan Dame, MD, Sheran Fava, Zena Amos Referring MD:              Medicines:                Monitored Anesthesia Care Complications:            No immediate complications. Estimated Blood Loss:     Estimated blood loss: none. Estimated blood loss                            was minimal. Procedure:                Pre-Anesthesia Assessment:                           - Prior to the procedure, a History and Physical                            was performed, and patient medications and                            allergies were reviewed. The patient's tolerance of                            previous anesthesia was also reviewed. The risks                            and benefits of the procedure and the sedation                            options and risks were discussed with the patient.                            All questions were answered, and informed consent                            was obtained. Prior Anticoagulants: The patient has                            taken no anticoagulant or antiplatelet agents. ASA                            Grade Assessment: III - A patient with severe                            systemic disease.  After reviewing the risks and                            benefits, the patient was deemed in satisfactory                            condition to undergo the procedure.                           After obtaining informed consent, the colonoscope                            was passed under direct vision. Throughout the                            procedure, the patient's blood  pressure, pulse, and                            oxygen saturations were monitored continuously. The                            [id number]) scope was introduced through the                            anus and advanced to the the cecum, identified by                            appendiceal orifice and ileocecal valve. The                            colonoscopy was performed without difficulty. The                            patient tolerated the procedure well. The quality                            of the bowel preparation was evaluated using the                            BBPS [facility] Bowel Preparation Scale) with scores                            of: Right Colon = 2 (minor amount of residual                            staining, small fragments of stool and/or opaque                            liquid, but mucosa seen well), Transverse Colon = 2                            (minor amount of residual staining, small fragments  of stool and/or opaque liquid, but mucosa seen                            well) and Left Colon = 2 (minor amount of residual                            staining, small fragments of stool and/or opaque                            liquid, but mucosa seen well). The total BBPS score                            equals 6. The ileocecal valve, appendiceal orifice,                            and rectum were photographed. Scope In: 9:04:10 AM Scope Out: 9:58:26 AM Scope Withdrawal Time: 0 hours 49 minutes 9 seconds  Total Procedure Duration: 0 hours 54 minutes 16 seconds  Findings:      Hemorrhoids were found on perianal exam.      11 sessile polyps were found in the sigmoid colon, descending colon,       transverse colon, hepatic flexure and ascending colon. The polyps were 5       to 9 mm in size. These polyps were removed with a cold snare. Resection       and retrieval were complete.      A 13 mm polyp was found in the ascending colon. The  polyp was sessile.       The polyp was removed with a cold snare. Resection and retrieval were       complete.      Scattered diverticula were found in the entire colon.      Non-bleeding external and internal hemorrhoids were found during       retroflexion.      Small, non-bleeding rectal varices were found. Impression:               - Hemorrhoids found on perianal exam.                           - 11 5 to 9 mm polyps in the sigmoid colon, in the                            descending colon, in the transverse colon, at the                            hepatic flexure and in the ascending colon, removed                            with a cold snare. Resected and retrieved.                           - One 13 mm polyp in the ascending colon, removed                            with  a cold snare. Resected and retrieved.                           - Diverticulosis in the entire examined colon.                           - Non-bleeding external and internal hemorrhoids.                           - Rectal varices. Moderate Sedation:      Per Anesthesia Care Recommendation:           - Patient has a contact number available for                            emergencies. The signs and symptoms of potential                            delayed complications were discussed with the                            patient. Return to normal activities tomorrow.                            Written discharge instructions were provided to the                            patient.                           - Resume previous diet.                           - Continue present medications.                           - Await pathology results.                           - Repeat colonoscopy in 1 year for surveillance                            given 12 polyps removed. Procedure Code(s):        --- Professional ---                           [phone], Colonoscopy, flexible; with removal of                            tumor(s),  polyp(s), or other lesion(s) by snare                            technique Diagnosis Code(s):        --- Professional ---                           K64.8, Other hemorrhoids  Z12.11, Encounter for screening for malignant                            neoplasm of colon                           D12.5, Benign neoplasm of sigmoid colon                           D12.4, Benign neoplasm of descending colon                           D12.3, Benign neoplasm of transverse colon (hepatic                            flexure or splenic flexure)                           D12.2, Benign neoplasm of ascending colon CPT copyright 2022 American Medical Association. All rights reserved. The codes documented in this report are preliminary and upon coder review may  be revised to meet current compliance requirements. Sanjuan Dame, MD Sanjuan Dame, MD 06/10/2023 10:05:37 AM This report has been signed electronically. Number of Addenda: 0

## 2023-06-10 NOTE — Anesthesia Postprocedure Evaluation (Signed)
Anesthesia Post Note  Patient: Malik Hill  Procedure(s) Performed: COLONOSCOPY WITH PROPOFOL ESOPHAGOGASTRODUODENOSCOPY (EGD) WITH PROPOFOL BIOPSY POLYPECTOMY  Patient location during evaluation: Phase II Anesthesia Type: General Level of consciousness: awake and alert and oriented Pain management: pain level controlled Vital Signs Assessment: post-procedure vital signs reviewed and stable Respiratory status: spontaneous breathing, nonlabored ventilation and respiratory function stable Cardiovascular status: blood pressure returned to baseline and stable Postop Assessment: no apparent nausea or vomiting Anesthetic complications: no  No notable events documented.   Last Vitals:  Vitals:   06/10/23 0804 06/10/23 1003  BP: (!) 140/88 (!) 97/51  Pulse: 85 75  Resp: 14 13  Temp: 37.2 C 36.9 C  SpO2: 98% 95%    Last Pain:  Vitals:   06/10/23 1003  TempSrc: Oral  PainSc: 0-No pain                 Matt Delpizzo C Beyla Loney

## 2023-06-10 NOTE — Anesthesia Procedure Notes (Signed)
Date/Time: 06/10/2023 8:47 AM  Performed by: Julian Reil, CRNAPre-anesthesia Checklist: Patient identified, Emergency Drugs available, Suction available and Patient being monitored Patient Re-evaluated:Patient Re-evaluated prior to induction Oxygen Delivery Method: Nasal cannula Induction Type: IV induction Placement Confirmation: positive ETCO2

## 2023-06-10 NOTE — Op Note (Signed)
[facility] Patient Name: Malik Hill Procedure Date: 06/10/2023 8:19 AM MRN: [id number] Date of Birth: Dec 06, 1960 Attending MD: Sanjuan Dame , MD, [id number] CSN: [id number] Age: 62 Admit Type: Outpatient Procedure:                Upper GI endoscopy Indications:              Cirrhosis rule out esophageal varices Providers:                Sanjuan Dame, MD, Sheran Fava, Zena Amos Referring MD:              Medicines:                Monitored Anesthesia Care Complications:            No immediate complications. Estimated Blood Loss:     Estimated blood loss was minimal. Procedure:                Pre-Anesthesia Assessment:                           - Prior to the procedure, a History and Physical                            was performed, and patient medications and                            allergies were reviewed. The patient's tolerance of                            previous anesthesia was also reviewed. The risks                            and benefits of the procedure and the sedation                            options and risks were discussed with the patient.                            All questions were answered, and informed consent                            was obtained. Prior Anticoagulants: The patient has                            taken no anticoagulant or antiplatelet agents. ASA                            Grade Assessment: III - A patient with severe                            systemic disease. After reviewing the risks and  benefits, the patient was deemed in satisfactory                            condition to undergo the procedure.                           After obtaining informed consent, the endoscope was                            passed under direct vision. Throughout the                            procedure, the patient's blood pressure, pulse, and                            oxygen  saturations were monitored continuously. The                            [id number]-H190 ([id number]) scope was introduced through the                            mouth, and advanced to the second part of duodenum.                            The upper GI endoscopy was accomplished without                            difficulty. The patient tolerated the procedure                            well. Scope In: 8:49:14 AM Scope Out: 8:58:49 AM Total Procedure Duration: 0 hours 9 minutes 35 seconds  Findings:      Grade I varices were found in the lower third of the esophagus.      A 2 cm hiatal hernia was present.      A single 5 mm flat polyp with no bleeding and no stigmata of recent       bleeding was found in the gastric antrum. The polyp was removed with a       cold snare. Resection and retrieval were complete.      Moderate inflammation characterized by erythema and granularity was       found in the entire examined stomach. Biopsies were taken with a cold       forceps for histology.      Mildly erythematous mucosa without active bleeding and with no stigmata       of bleeding was found in the duodenal bulb and in the second portion of       the duodenum. Impression:               - Grade I esophageal varices.                           - 2 cm hiatal hernia.                           - A single  gastric polyp. Resected and retrieved.                           - Gastritis. Biopsied.                           - Erythematous duodenopathy. Moderate Sedation:      Per Anesthesia Care Recommendation:           - Patient has a contact number available for                            emergencies. The signs and symptoms of potential                            delayed complications were discussed with the                            patient. Return to normal activities tomorrow.                            Written discharge instructions were provided to the                            patient.                            - Resume previous diet.                           - Continue present medications.                           - Await pathology results.                           - Repeat upper endoscopy in 3 years for                            surveillance.                           -consider switching atenolol to carvedilol twice                            daily                           - Return to primary care physician as previously                            scheduled. Procedure Code(s):        --- Professional ---                           [phone], Esophagogastroduodenoscopy, flexible,                            transoral; with removal  of tumor(s), polyp(s), or                            other lesion(s) by snare technique                           43239, 59, Esophagogastroduodenoscopy, flexible,                            transoral; with biopsy, single or multiple Diagnosis Code(s):        --- Professional ---                           K74.60, Unspecified cirrhosis of liver                           I85.10, Secondary esophageal varices without                            bleeding                           K44.9, Diaphragmatic hernia without obstruction or                            gangrene                           K31.7, Polyp of stomach and duodenum                           K29.70, Gastritis, unspecified, without bleeding                           K31.89, Other diseases of stomach and duodenum CPT copyright 2022 American [employer] Association. All rights reserved. The codes documented in this report are preliminary and upon coder review may  be revised to meet current compliance requirements. Sanjuan Dame, MD Sanjuan Dame, MD 06/10/2023 10:00:09 AM This report has been signed electronically. Number of Addenda: 0

## 2023-06-10 NOTE — Anesthesia Preprocedure Evaluation (Signed)
Anesthesia Evaluation  Patient identified by MRN, date of birth, ID band Patient awake    Reviewed: Allergy & Precautions, H&P , NPO status , Patient's Chart, lab work & pertinent test results, reviewed documented beta blocker date and time   Airway Mallampati: II  TM Distance: >3 FB Neck ROM: Full    Dental  (+) Dental Advisory Given, Caps, Teeth Intact   Pulmonary former smoker   Pulmonary exam normal breath sounds clear to auscultation       Cardiovascular Exercise Tolerance: Good hypertension, Pt. on medications and Pt. on home beta blockers Normal cardiovascular exam Rhythm:Regular Rate:Normal     Neuro/Psych  Headaches PSYCHIATRIC DISORDERS Anxiety Depression    CVA (cognitive deficits), Residual Symptoms    GI/Hepatic ,GERD  Medicated and Controlled,,(+) Cirrhosis  (non alcoholic)        Endo/Other  diabetes, Well Controlled, Type 2, Oral Hypoglycemic Agents    Renal/GU Renal InsufficiencyRenal disease  negative genitourinary   Musculoskeletal  (+) Arthritis , Osteoarthritis,    Abdominal   Peds negative pediatric ROS (+)  Hematology negative hematology ROS (+)   Anesthesia Other Findings   Reproductive/Obstetrics negative OB ROS                             Anesthesia Physical Anesthesia Plan  ASA: 3  Anesthesia Plan: General   Post-op Pain Management: Minimal or no pain anticipated   Induction: Intravenous  PONV Risk Score and Plan: 4 or greater and 1 and Propofol infusion  Airway Management Planned: Nasal Cannula and Natural Airway  Additional Equipment:   Intra-op Plan:   Post-operative Plan:   Informed Consent: I have reviewed the patients History and Physical, chart, labs and discussed the procedure including the risks, benefits and alternatives for the proposed anesthesia with the patient or authorized representative who has indicated his/her understanding and  acceptance.     Dental advisory given  Plan Discussed with: CRNA and Surgeon  Anesthesia Plan Comments:        Anesthesia Quick Evaluation

## 2023-06-10 NOTE — Transfer of Care (Signed)
Immediate Anesthesia Transfer of Care Note  Patient: Malik Hill  Procedure(s) Performed: COLONOSCOPY WITH PROPOFOL ESOPHAGOGASTRODUODENOSCOPY (EGD) WITH PROPOFOL BIOPSY POLYPECTOMY  Patient Location: Short Stay  Anesthesia Type:General  Level of Consciousness: awake and alert   Airway & Oxygen Therapy: Patient Spontanous Breathing  Post-op Assessment: Report given to RN and Post -op Vital signs reviewed and stable  Post vital signs: Reviewed and stable  Last Vitals:  Vitals Value Taken Time  BP    Temp    Pulse    Resp    SpO2      Last Pain:  Vitals:   06/10/23 0846  PainSc: 5       Patients Stated Pain Goal: 7 (06/10/23 0803)  Complications: No notable events documented.

## 2023-06-10 NOTE — Interval H&P Note (Signed)
History and Physical Interval Note:  06/10/2023 8:27 AM  Malik Hill  has presented today for surgery, with the diagnosis of Cirrhosis.  The various methods of treatment have been discussed with the patient and family. After consideration of risks, benefits and other options for treatment, the patient has consented to  Procedure(s) with comments: COLONOSCOPY WITH PROPOFOL (N/A) - 9:45am;asa 3 ESOPHAGOGASTRODUODENOSCOPY (EGD) WITH PROPOFOL (N/A) - 9:45am;asa 3 as a surgical intervention.  The patient's history has been reviewed, patient examined, no change in status, stable for surgery.  I have reviewed the patient's chart and labs.  Questions were answered to the patient's satisfaction.     [name] [name]

## 2023-06-11 ENCOUNTER — Encounter (INDEPENDENT_AMBULATORY_CARE_PROVIDER_SITE_OTHER): Payer: Self-pay | Admitting: *Deleted

## 2023-06-11 LAB — SURGICAL PATHOLOGY

## 2023-06-14 NOTE — Progress Notes (Signed)
I reviewed the pathology results. Ann, can you send her a letter with the findings as described below please? Repeat colonoscopy in 1 years Upper endoscopy in 3 year  Thanks,  [name], MD Gastroenterology and Hepatology [facility]  ---------------------------------------------------------------------------------------------  [facility] [address], [address], [place], [place] [address] Phone:  [phone]   06/14/23 [place], [place]   Dear Malik Hill,  I am writing to inform you that the biopsies taken during your recent endoscopic examination showed: Tubular Adenoma and hyperplastic polyps  I am writing to let you know the results of your recent colonoscopy.  You had a total of 12 polyps removed. The pathology came back as "tubular adenoma and hyperplastic polyp." These findings are NOT cancer, but had the polyps remained in your colon, they could have turned into cancer.  Given these findings, it is recommended that your next colonoscopy be performed in 1 year given more than 10 polyps were removed  Upper endoscopy biopsies with NO H. Pylori bacteria in stomach , or any early cancer changes to the stomach mucosa ( Intestinal metaplasia)   I suggest repeating upper endoscopy in 3 years   Please call us at [phone] if you have persistent problems or have questions about your condition that have not been fully answered at this time.  Sincerely,  [name], MD Gastroenterology and Hepatology

## 2023-06-15 ENCOUNTER — Encounter (INDEPENDENT_AMBULATORY_CARE_PROVIDER_SITE_OTHER): Payer: Self-pay | Admitting: *Deleted

## 2023-06-17 ENCOUNTER — Ambulatory Visit: Payer: BC Managed Care – PPO | Admitting: Psychiatry

## 2023-06-17 ENCOUNTER — Encounter: Payer: Self-pay | Admitting: Psychiatry

## 2023-06-17 VITALS — BP 107/77 | HR 88 | Ht 77.0 in | Wt 267.5 lb

## 2023-06-17 DIAGNOSIS — I639 Cerebral infarction, unspecified: Secondary | ICD-10-CM | POA: Diagnosis not present

## 2023-06-17 DIAGNOSIS — R413 Other amnesia: Secondary | ICD-10-CM | POA: Diagnosis not present

## 2023-06-17 DIAGNOSIS — G43709 Chronic migraine without aura, not intractable, without status migrainosus: Secondary | ICD-10-CM | POA: Diagnosis not present

## 2023-06-17 MED ORDER — EZETIMIBE 10 MG PO TABS: 10.0000 mg | ORAL_TABLET | Freq: Every day | ORAL | 6 refills | Status: DC

## 2023-06-17 MED ORDER — MEMANTINE HCL 5 MG PO TABS: ORAL_TABLET | ORAL | 6 refills | Status: DC

## 2023-06-17 NOTE — Progress Notes (Signed)
CC:  headaches, memory changes  Follow-up Visit  Last visit: 11/12/22  Brief HPI: 62 year old male with a history of depression, anxiety, HTN, DM, CVA, and kidney stones who follows in clinic for headaches and memory loss. 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 CTA head/neck did not show any hemodynamically significant stenosis. TTE showed EF of 60-65% with no evidence of shunt.   At his last visit he was started on nurtec for rescue. He was started on donepezil for memory and referred to neuropsychology. Interval History: Headaches have been well-controlled Nurtec as needed. He has not had any migraines recently so he stopped taking Emgality.  Continues to struggle with short term memory and word finding difficulty. Feels like this is worsening over time. One month ago he got lost while driving in his neighborhood. Was able to find his house because his wife was waving on the front porch. He essentially only drives to the bank and grocery story which are a block from his house. Saw neuropsychology who noted testing was invalid due to poor effort, but was not suggestive of a neurodegenerative disorder. The patient states he did try his best on the test. He has an appointment with neuropsychology for a second opinion in November. He developed diarrhea on donepezil so he stopped it.  He was started on Lipitor but was unable to tolerate it due to muscle aches. Tried Crestor as well but couldn't tolerate this either.  Migraine days per month: 0 Headache free days per month: 30  Current Headache Regimen: Preventative: Emgality Abortive: Nurtec   Prior Therapies                                  Rescue: Tylenol Ubrelvy 100 mg PRN Cannot take NSAIDs due to kidney issues Triptans contrainidcated due to CVA   Prevention: Losartan 25 mg daily Atenolol 25 mg daily Paroxetine 30 mg daily Emgality 120 mg monthly Topamax  contraindicated due to kidney stones  Physical Exam:   Vital Signs: Ht 6\' 5"  (1.956 m)   Wt 267 lb 8 oz (121.3 kg)   BMI 31.72 kg/m  GENERAL:  well appearing, in no acute distress, alert  SKIN:  Color, texture, turgor normal. No rashes or lesions HEAD:  Normocephalic/atraumatic. RESP: normal respiratory effort MSK:  No gross joint deformities.   NEUROLOGICAL: Mental Status:    06/17/2023   11:43 AM 08/11/2022   11:21 AM  Montreal Cognitive Assessment   Visuospatial/ Executive (0/5) 5 0  Naming (0/3) 3 1  Attention: Read list of digits (0/2) 2 2  Attention: Read list of letters (0/1) 1 1  Attention: Serial 7 subtraction starting at 100 (0/3) 3 1  Language: Repeat phrase (0/2) 1 1  Language : Fluency (0/1) 1 0  Abstraction (0/2) 2 2  Delayed Recall (0/5) 3 2  Orientation (0/6) 6 6  Total 27 16  Adjusted Score (based on education) 28 16   Cranial Nerves: PERRL, face symmetric, no dysarthria, hearing grossly intact Motor: moves all extremities equally Gait: normal-based.  IMPRESSION: 62 year old male with a history of depression, anxiety, HTN, DM, CVA, and kidney stones who presents for follow up of headaches and memory loss. Migraines have improved significantly and he is no longer taking Emgality. He continues to be concerned with his memory loss and has stopped driving other than to  the grocery store and bank. However his MOCA today was normal and score has increased significantly since his last testing one year ago (16->28). Neuropsychological testing was invalid, suspected to be due to poor effort though patient denies this. He will see neuropsychology in November for a second opinion, appreciate these recommendations. He was unable to tolerate donepezil and would like to start a memory medication. Counseled that he does not have a diagnosis of dementia at this time and clinical picture is currently unclear given large variations in [facility] scoring and invalid neuropsych results.  However he does not want to wait until November to potentially start something. Will trial Namenda for now, however would favor stopping this if neuropsychological testing in November is not suggestive of a neurodegenerative disorder. Will start Zetia for cholesterol management.  PLAN: Migraine: -Rescue: Continue Nurtec 75 mg PRN   Memory: -Start Namenda   week one:  take (5mg ) in the morning   week two:  take (5mg ) in the morning and take (5mg ) in the evening  week three: take (10mg ) in the morning and take (5mg ) in the evening  week four:  take (10mg ) in the morning and take (10mg ) in the evening -Neuropsychological evaluation scheduled for November   CVA: -Continue ASA 81 mg daily -Start Zetia 10 mg daily     Follow-up: 6 months  I spent a total of 44 minutes on the date of the service. Headache education was done. Discussed treatment options including preventive and acute medications. Discussed medication side effects, adverse reactions and drug interactions. Written educational materials and patient instructions outlining all of the above were given.  Ocie Doyne, MD 06/17/23 12:43 PM

## 2023-06-28 ENCOUNTER — Encounter (HOSPITAL_COMMUNITY)
Admission: RE | Admit: 2023-06-28 | Discharge: 2023-06-28 | Disposition: A | Discharge status: Home / Self Care | Payer: BC Managed Care – PPO | Source: Ambulatory Visit | Attending: Urology | Admitting: Urology

## 2023-06-28 ENCOUNTER — Encounter (HOSPITAL_COMMUNITY): Payer: Self-pay

## 2023-06-29 ENCOUNTER — Encounter (HOSPITAL_COMMUNITY): Payer: Self-pay | Admitting: Gastroenterology

## 2023-07-01 ENCOUNTER — Ambulatory Visit (HOSPITAL_COMMUNITY): Payer: BC Managed Care – PPO | Admitting: Certified Registered"

## 2023-07-01 ENCOUNTER — Encounter (INDEPENDENT_AMBULATORY_CARE_PROVIDER_SITE_OTHER): Payer: Self-pay | Admitting: Gastroenterology

## 2023-07-01 ENCOUNTER — Encounter (HOSPITAL_COMMUNITY)
Admission: RE | Disposition: A | Discharge status: Home / Self Care | Payer: Self-pay | Source: Home / Self Care | Attending: Urology

## 2023-07-01 ENCOUNTER — Encounter (HOSPITAL_COMMUNITY): Payer: Self-pay | Admitting: Urology

## 2023-07-01 ENCOUNTER — Ambulatory Visit (HOSPITAL_COMMUNITY)
Admission: RE | Admit: 2023-07-01 | Discharge: 2023-07-01 | Disposition: A | Discharge status: Home / Self Care | Payer: BC Managed Care – PPO | Attending: Urology | Admitting: Urology

## 2023-07-01 ENCOUNTER — Ambulatory Visit (HOSPITAL_COMMUNITY): Payer: BC Managed Care – PPO

## 2023-07-01 DIAGNOSIS — Z87442 Personal history of urinary calculi: Secondary | ICD-10-CM | POA: Insufficient documentation

## 2023-07-01 DIAGNOSIS — N400 Enlarged prostate without lower urinary tract symptoms: Secondary | ICD-10-CM | POA: Insufficient documentation

## 2023-07-01 DIAGNOSIS — Z7985 Long-term (current) use of injectable non-insulin antidiabetic drugs: Secondary | ICD-10-CM | POA: Diagnosis not present

## 2023-07-01 DIAGNOSIS — N200 Calculus of kidney: Secondary | ICD-10-CM | POA: Diagnosis present

## 2023-07-01 DIAGNOSIS — Z87891 Personal history of nicotine dependence: Secondary | ICD-10-CM | POA: Diagnosis not present

## 2023-07-01 DIAGNOSIS — K5730 Diverticulosis of large intestine without perforation or abscess without bleeding: Secondary | ICD-10-CM | POA: Insufficient documentation

## 2023-07-01 DIAGNOSIS — Z7984 Long term (current) use of oral hypoglycemic drugs: Secondary | ICD-10-CM | POA: Diagnosis not present

## 2023-07-01 DIAGNOSIS — E119 Type 2 diabetes mellitus without complications: Secondary | ICD-10-CM | POA: Insufficient documentation

## 2023-07-01 DIAGNOSIS — M1990 Unspecified osteoarthritis, unspecified site: Secondary | ICD-10-CM | POA: Diagnosis not present

## 2023-07-01 HISTORY — PX: HOLMIUM LASER APPLICATION: SHX5852

## 2023-07-01 HISTORY — PX: CYSTOSCOPY WITH RETROGRADE PYELOGRAM, URETEROSCOPY AND STENT PLACEMENT: SHX5789

## 2023-07-01 LAB — GLUCOSE, CAPILLARY
Glucose-Capillary: 133 mg/dL — ABNORMAL HIGH (ref 70–99)
Glucose-Capillary: 117 mg/dL — ABNORMAL HIGH (ref 70–99)

## 2023-07-01 SURGERY — CYSTOURETEROSCOPY, WITH RETROGRADE PYELOGRAM AND STENT INSERTION
Anesthesia: General | Site: Ureter | Laterality: Left

## 2023-07-01 MED ORDER — PHENYLEPHRINE 80 MCG/ML (10ML) SYRINGE FOR IV PUSH (FOR BLOOD PRESSURE SUPPORT)
PREFILLED_SYRINGE | INTRAVENOUS | Status: AC
  Filled 2023-07-01: qty 10

## 2023-07-01 MED ORDER — HYDROCODONE-ACETAMINOPHEN 5-325 MG PO TABS: 1.0000 | ORAL_TABLET | Freq: Four times a day (QID) | ORAL | 0 refills | Status: DC | PRN

## 2023-07-01 MED ORDER — PROPOFOL 10 MG/ML IV BOLUS
INTRAVENOUS | Status: DC | PRN
  Administered 2023-07-01: 240 mg via INTRAVENOUS

## 2023-07-01 MED ORDER — SODIUM CHLORIDE 0.9 % IR SOLN
Status: DC | PRN
  Administered 2023-07-01: 3000 mL via INTRAVESICAL

## 2023-07-01 MED ORDER — DEXAMETHASONE SODIUM PHOSPHATE 10 MG/ML IJ SOLN
INTRAMUSCULAR | Status: AC
  Filled 2023-07-01: qty 1

## 2023-07-01 MED ORDER — FENTANYL CITRATE (PF) 100 MCG/2ML IJ SOLN
INTRAMUSCULAR | Status: AC
  Filled 2023-07-01: qty 2

## 2023-07-01 MED ORDER — PROPOFOL 10 MG/ML IV BOLUS
INTRAVENOUS | Status: AC
  Filled 2023-07-01: qty 20

## 2023-07-01 MED ORDER — MIDAZOLAM HCL 2 MG/2ML IJ SOLN
INTRAMUSCULAR | Status: AC
  Filled 2023-07-01: qty 2

## 2023-07-01 MED ORDER — ROCURONIUM BROMIDE 10 MG/ML (PF) SYRINGE
PREFILLED_SYRINGE | INTRAVENOUS | Status: AC
  Filled 2023-07-01: qty 10

## 2023-07-01 MED ORDER — LACTATED RINGERS IV SOLN
INTRAVENOUS | Status: DC | PRN
Start: 2023-07-01 — End: 2023-07-01

## 2023-07-01 MED ORDER — DIATRIZOATE MEGLUMINE 30 % UR SOLN
URETHRAL | Status: AC
  Filled 2023-07-01: qty 100

## 2023-07-01 MED ORDER — LIDOCAINE HCL (CARDIAC) PF 100 MG/5ML IV SOSY
PREFILLED_SYRINGE | INTRAVENOUS | Status: DC | PRN
  Administered 2023-07-01: 80 mg via INTRAVENOUS

## 2023-07-01 MED ORDER — FENTANYL CITRATE PF 50 MCG/ML IJ SOSY: 25.0000 ug | PREFILLED_SYRINGE | INTRAMUSCULAR | Status: DC | PRN

## 2023-07-01 MED ORDER — ONDANSETRON HCL 4 MG PO TABS: 4.0000 mg | ORAL_TABLET | Freq: Every day | ORAL | 1 refills | Status: AC | PRN

## 2023-07-01 MED ORDER — FENTANYL CITRATE (PF) 100 MCG/2ML IJ SOLN
INTRAMUSCULAR | Status: DC | PRN
  Administered 2023-07-01: 100 ug via INTRAVENOUS
  Administered 2023-07-01 (×2): 25 ug via INTRAVENOUS

## 2023-07-01 MED ORDER — CEFAZOLIN SODIUM-DEXTROSE 1-4 GM/50ML-% IV SOLN
INTRAVENOUS | Status: AC
  Filled 2023-07-01: qty 50

## 2023-07-01 MED ORDER — ROCURONIUM BROMIDE 10 MG/ML (PF) SYRINGE
PREFILLED_SYRINGE | INTRAVENOUS | Status: DC | PRN
  Administered 2023-07-01: 70 mg via INTRAVENOUS

## 2023-07-01 MED ORDER — MIDAZOLAM HCL 2 MG/2ML IJ SOLN
INTRAMUSCULAR | Status: DC | PRN
  Administered 2023-07-01: 2 mg via INTRAVENOUS

## 2023-07-01 MED ORDER — CHLORHEXIDINE GLUCONATE 0.12 % MT SOLN
OROMUCOSAL | Status: AC
  Administered 2023-07-01: 15 mL
  Filled 2023-07-01: qty 15

## 2023-07-01 MED ORDER — DEXAMETHASONE SODIUM PHOSPHATE 10 MG/ML IJ SOLN
INTRAMUSCULAR | Status: DC | PRN
  Administered 2023-07-01: 4 mg via INTRAVENOUS

## 2023-07-01 MED ORDER — LIDOCAINE HCL (PF) 2 % IJ SOLN
INTRAMUSCULAR | Status: AC
  Filled 2023-07-01: qty 5

## 2023-07-01 MED ORDER — SUGAMMADEX SODIUM 200 MG/2ML IV SOLN
INTRAVENOUS | Status: DC | PRN
  Administered 2023-07-01: 250 mg via INTRAVENOUS

## 2023-07-01 MED ORDER — ONDANSETRON HCL 4 MG/2ML IJ SOLN
INTRAMUSCULAR | Status: AC
  Filled 2023-07-01: qty 2

## 2023-07-01 MED ORDER — CEFAZOLIN IN SODIUM CHLORIDE 3-0.9 GM/100ML-% IV SOLN
3.0000 g | INTRAVENOUS | Status: AC
  Administered 2023-07-01: 3 g via INTRAVENOUS
  Filled 2023-07-01: qty 100

## 2023-07-01 MED ORDER — DIATRIZOATE MEGLUMINE 30 % UR SOLN
URETHRAL | Status: DC | PRN
  Administered 2023-07-01: 10 mL via URETHRAL

## 2023-07-01 MED ORDER — ONDANSETRON HCL 4 MG/2ML IJ SOLN
INTRAMUSCULAR | Status: DC | PRN
  Administered 2023-07-01: 4 mg via INTRAVENOUS

## 2023-07-01 MED ORDER — WATER FOR IRRIGATION, STERILE IR SOLN
Status: DC | PRN
  Administered 2023-07-01: 500 mL
  Administered 2023-07-01: 3000 mL

## 2023-07-01 MED ORDER — ONDANSETRON HCL 4 MG/2ML IJ SOLN: 4.0000 mg | Freq: Once | INTRAMUSCULAR | Status: DC | PRN

## 2023-07-01 SURGICAL SUPPLY — 27 items
BAG DRAIN URO TABLE W/ADPT NS (BAG) ×1 IMPLANT
BAG DRN 8 ADPR NS SKTRN CSTL (BAG) ×1
BAG HAMPER (MISCELLANEOUS) ×1 IMPLANT
CATH INTERMIT  6FR 70CM (CATHETERS) ×1 IMPLANT
CLOTH BEACON ORANGE TIMEOUT ST (SAFETY) ×1 IMPLANT
DECANTER SPIKE VIAL GLASS SM (MISCELLANEOUS) ×1 IMPLANT
EXTRACTOR STONE NITINOL NGAGE (UROLOGICAL SUPPLIES) IMPLANT
GLOVE BIO SURGEON STRL SZ8 (GLOVE) ×1 IMPLANT
GLOVE BIOGEL PI IND STRL 7.0 (GLOVE) ×2 IMPLANT
GOWN STRL REUS W/TWL LRG LVL3 (GOWN DISPOSABLE) ×1 IMPLANT
GOWN STRL REUS W/TWL XL LVL3 (GOWN DISPOSABLE) ×1 IMPLANT
GUIDEWIRE STR DUAL SENSOR (WIRE) ×1 IMPLANT
GUIDEWIRE STR ZIPWIRE 035X150 (MISCELLANEOUS) ×1 IMPLANT
IV NS IRRIG 3000ML ARTHROMATIC (IV SOLUTION) ×2 IMPLANT
KIT TURNOVER CYSTO (KITS) ×1 IMPLANT
MANIFOLD NEPTUNE II (INSTRUMENTS) ×1 IMPLANT
PACK CYSTO (CUSTOM PROCEDURE TRAY) ×1 IMPLANT
PAD ARMBOARD 7.5X6 YLW CONV (MISCELLANEOUS) ×1 IMPLANT
POSITIONER HEAD 8X9X4 ADT (SOFTGOODS) ×1 IMPLANT
SHEATH URETERAL 12FRX35CM (MISCELLANEOUS) IMPLANT
STENT URET 6FRX26 CONTOUR (STENTS) IMPLANT
SYR 10ML LL (SYRINGE) ×1 IMPLANT
SYR CONTROL 10ML LL (SYRINGE) ×1 IMPLANT
TOWEL OR 17X26 4PK STRL BLUE (TOWEL DISPOSABLE) ×1 IMPLANT
TRACTIP FLEXIVA PULS ID 200XHI (Laser) IMPLANT
TRACTIP FLEXIVA PULSE ID 200 (Laser) ×1
WATER STERILE IRR 500ML POUR (IV SOLUTION) ×1 IMPLANT

## 2023-07-01 NOTE — Transfer of Care (Signed)
Immediate Anesthesia Transfer of Care Note  Patient: Nakoa Kithcart  Procedure(s) Performed: CYSTOSCOPY WITH RETROGRADE PYELOGRAM, URETEROSCOPY AND STENT PLACEMENT (Left: Ureter) HOLMIUM LASER APPLICATION (Left: Ureter)  Patient Location: PACU  Anesthesia Type:General  Level of Consciousness: drowsy and patient cooperative  Airway & Oxygen Therapy: Patient Spontanous Breathing and Patient connected to face mask oxygen  Post-op Assessment: Report given to RN and Post -op Vital signs reviewed and stable  Post vital signs: Reviewed and stable  Last Vitals:  Vitals Value Taken Time  BP 138/91 07/01/23 0847  Temp 97.5 098/26/24 0847  Pulse 84 07/01/23 0849  Resp 12 07/01/23 0849  SpO2 92 % 07/01/23 0849  Vitals shown include unfiled device data.  Last Pain:  Vitals:   07/01/23 0641  TempSrc: Oral  PainSc: 0-No pain         Complications: No notable events documented.

## 2023-07-01 NOTE — H&P (Signed)
HPI: Mr Wesp is a 62yo here for left ureteroscopic stone extraction. Ct stone study shows residual 5-49mm left renal calculi. He denies any flank pain. No worsening LUTS. He remains on sodium bicarbonate 650mg  BID.      PMH:     Past Medical History:  Diagnosis Date   Anxiety     BPH (benign prostatic hyperplasia)     Depression     Diabetes mellitus without complication (HCC)     Headache     Kidney stones     MDD (major depressive disorder)     Memory loss     Osteoarthritis     Stroke (HCC) 09/2021          Surgical History:      Past Surgical History:  Procedure Laterality Date   BUNIONECTOMY       COLONOSCOPY   06/09/2007   CYSTOSCOPY WITH RETROGRADE PYELOGRAM, URETEROSCOPY AND STENT PLACEMENT Left 01/21/2023    Procedure: CYSTOSCOPY WITH RETROGRADE PYELOGRAM, URETEROSCOPY AND STENT PLACEMENT;  Surgeon: Malen Gauze, MD;  Location: [facility];  Service: Urology;  Laterality: Left;   KNEE SURGERY Left 2015   PENILE ADHESIONS LYSIS       TONSILLECTOMY AND ADENOIDECTOMY        age 62          Home Medications:  Allergies as of 05/05/2023         Reactions    Penicillins Rash            Medication List           Accurate as of May 05, 2023  3:06 PM. If you have any questions, ask your nurse or doctor.              acetaminophen 500 MG tablet Commonly known as: TYLENOL Take 1,000 mg by mouth every 6 (six) hours as needed for moderate pain or headache.    ALPRAZolam 1 MG tablet Commonly known as: XANAX Take 1 mg by mouth 4 (four) times daily as needed.    atenolol 25 MG tablet Commonly known as: TENORMIN Take 25 mg by mouth 2 (two) times daily.    atorvastatin 40 MG tablet Commonly known as: Lipitor Take 1 tablet (40 mg total) by mouth daily.    cetirizine 10 MG tablet Commonly known as: ZYRTEC Take 10 mg by mouth daily as needed for allergies.    cyanocobalamin 1000 MCG tablet Take 1 tablet (1,000 mcg total) by mouth daily.     donepezil 5 MG tablet Commonly known as: ARICEPT Take 1 tablet (5 mg total) by mouth daily. Take for 4 weeks, then increase to 10 mg daily    donepezil 10 MG tablet Commonly known as: ARICEPT Take 1 tablet (10 mg total) by mouth daily.    Emgality 120 MG/ML Soaj Generic drug: Galcanezumab-gnlm Inject 120 mg into the skin every 30 (thirty) days.    fluticasone 50 MCG/ACT nasal spray Commonly known as: FLONASE Place 2 sprays into both nostrils daily as needed for allergies or rhinitis.    glipiZIDE 10 MG 24 hr tablet Commonly known as: GLUCOTROL XL Take 10 mg by mouth 2 (two) times daily.    hydrochlorothiazide 12.5 MG capsule Commonly known as: MICROZIDE Take 12.5 mg by mouth daily.    Jardiance 25 MG Tabs tablet Generic drug: empagliflozin Take 25 mg by mouth daily.    losartan 25 MG tablet Commonly known as: COZAAR Take 25 mg by mouth daily.  Nurtec 75 MG Tbdp Generic drug: Rimegepant Sulfate Take 1 tablet (75 mg total) by mouth as needed (migraine). Max dose 1 pill in 24 hours    omeprazole 20 MG tablet Commonly known as: PRILOSEC OTC Take 20 mg by mouth daily as needed (acid reflux).    ondansetron 4 MG tablet Commonly known as: Zofran Take 1 tablet (4 mg total) by mouth daily as needed for nausea or vomiting.    oxyCODONE-acetaminophen 5-325 MG tablet Commonly known as: Percocet Take 1 tablet by mouth every 4 (four) hours as needed for severe pain.    PARoxetine 30 MG tablet Commonly known as: PAXIL Take 60 mg by mouth daily.    phenazopyridine 100 MG tablet Commonly known as: PYRIDIUM Take 1 tablet (100 mg total) by mouth 3 (three) times daily as needed for pain.    pyridoxine 100 MG tablet Commonly known as: B-6 Take 1 tablet (100 mg total) by mouth daily.    QUEtiapine 100 MG tablet Commonly known as: SEROQUEL Take 100 mg by mouth at bedtime.    sodium bicarbonate 650 MG tablet Take 1 tablet (650 mg total) by mouth 2 (two) times daily.     tamsulosin 0.4 MG Caps capsule Commonly known as: FLOMAX Take 1 capsule (0.4 mg total) by mouth daily after supper.    traMADol 50 MG tablet Commonly known as: Ultram Take 1 tablet (50 mg total) by mouth every 6 (six) hours as needed.    traZODone 100 MG tablet Commonly known as: DESYREL Take 100 mg by mouth daily.    Trulicity 1.5 MG/0.5ML Sopn Generic drug: Dulaglutide Inject 1.5 mg into the skin every Wednesday.    Vitamin D (Ergocalciferol) 1.25 MG (50000 UNIT) Caps capsule Commonly known as: DRISDOL Take 1 capsule (50,000 Units total) by mouth every 7 (seven) days.             Allergies:  Allergies      Allergies  Allergen Reactions   Penicillins Rash        Family History:      Family History  Problem Relation Age of Onset   Hypertension Mother     Hypothyroidism Mother     Heart attack Father     Cancer Sister            Social History:  reports that he has quit smoking. His smoking use included cigarettes. He has never used smokeless tobacco. He reports that he does not drink alcohol and does not use drugs.   ROS: All other review of systems were reviewed and are negative except what is noted above in HPI   Physical Exam: BP 109/77   Pulse 94   Constitutional:  Alert and oriented, No acute distress. HEENT: [place] AT, moist mucus membranes.  Trachea midline, no masses. Cardiovascular: No clubbing, cyanosis, or edema. Respiratory: Normal respiratory effort, no increased work of breathing. GI: Abdomen is soft, nontender, nondistended, no abdominal masses GU: No CVA tenderness.  Lymph: No cervical or inguinal lymphadenopathy. Skin: No rashes, bruises or suspicious lesions. Neurologic: Grossly intact, no focal deficits, moving all 4 extremities. Psychiatric: Normal mood and affect.   Laboratory Data: Recent Labs       Lab Results  Component Value Date    WBC 3.4 (L) 09/29/2021    HGB 13.6 09/29/2021    HCT 41.5 09/29/2021    MCV 90.0 09/29/2021     PLT 101 (L) 09/29/2021        Recent Labs  Lab Results  Component Value Date    CREATININE 1.19 01/18/2023        Recent Labs  No results found for: "PSA"     Recent Labs  No results found for: "TESTOSTERONE"     Recent Labs       Lab Results  Component Value Date    HGBA1C 7.0 (H) 01/18/2023        Urinalysis Labs (Brief)          Component Value Date/Time    COLORURINE YELLOW 09/25/2021 1411    APPEARANCEUR Clear 03/17/2023 1407    LABSPEC 1.015 09/25/2021 1411    PHURINE 5.5 09/25/2021 1411    GLUCOSEU 3+ (A) 03/17/2023 1407    HGBUR LARGE (A) 09/25/2021 1411    BILIRUBINUR Negative 03/17/2023 1407    KETONESUR 15 (A) 09/25/2021 1411    PROTEINUR Negative 03/17/2023 1407    PROTEINUR TRACE (A) 09/25/2021 1411    UROBILINOGEN 0.2 02/17/2008 1558    NITRITE Negative 03/17/2023 1407    NITRITE NEGATIVE 09/25/2021 1411    LEUKOCYTESUR Trace (A) 03/17/2023 1407    LEUKOCYTESUR NEGATIVE 09/25/2021 1411        Recent Labs       Lab Results  Component Value Date    LABMICR See below: 03/17/2023    WBCUA 0-5 03/17/2023    LABEPIT 0-10 03/17/2023    BACTERIA None seen 03/17/2023        Pertinent Imaging: CT 05/03/2023: Images reviewed and discussed with the patient  No results found for this or any previous visit.   No results found for this or any previous visit.   No results found for this or any previous visit.   No results found for this or any previous visit.   No results found for this or any previous visit.   No valid procedures specified. Results for orders placed during the hospital encounter of 12/04/22   CT HEMATURIA WORKUP   Narrative CLINICAL DATA:  Gross hematuria * Tracking Code: BO *   EXAM: CT ABDOMEN AND PELVIS WITHOUT AND WITH CONTRAST   TECHNIQUE: Multidetector CT imaging of the abdomen and pelvis was performed following the standard protocol before and following the bolus administration of intravenous  contrast.   RADIATION DOSE REDUCTION: This exam was performed according to the departmental dose-optimization program which includes automated exposure control, adjustment of the mA and/or kV according to patient size and/or use of iterative reconstruction technique.   CONTRAST:  OMNIPAQUE IOHEXOL 350 MG/ML SOLN   COMPARISON:  02/13/2008   FINDINGS: Lower chest: No acute abnormality.   Hepatobiliary: No solid liver abnormality is seen. Coarse, nodular contour of the liver. No gallstones, gallbladder wall thickening, or biliary dilatation.   Pancreas: Unremarkable. No pancreatic ductal dilatation or surrounding inflammatory changes.   Spleen: Splenomegaly, maximum coronal span 16.5 cm.   Adrenals/Urinary Tract: Adrenal glands are unremarkable. Numerous left renal calculi. Heterogeneous, expansile and faintly calcified appearance of the left renal pelvis filling defect measuring 4.9 x 2.9 x 1.8 cm (series 10, image 64, series 7, image 52). The right kidney is normal, without renal calculi, solid lesion, or hydronephrosis. Mild wall thickening of the urinary bladder.   Stomach/Bowel: Stomach is within normal limits. Diverticulum of the descending duodenum. Appendix appears normal. No evidence of bowel wall thickening, distention, or inflammatory changes. Descending and sigmoid diverticulosis.   Vascular/Lymphatic: No significant vascular findings are present. No enlarged abdominal or pelvic lymph nodes.   Reproductive: No mass  or other significant abnormality.   Other: No abdominal wall hernia or abnormality. No ascites.   Musculoskeletal: No acute or significant osseous findings.   IMPRESSION: 1. Numerous left renal calculi. 2. Heterogeneous, expansile and faintly calcified appearance of the left renal pelvis filling defect measuring 4.9 x 2.9 x 1.8 cm. This may reflect a large, partially calcified staghorn calculus but appearance is somewhat concerning for  urothelial malignancy. 3. No evidence of lymphadenopathy or metastatic disease in the abdomen or pelvis. 4. Mild wall thickening of the urinary bladder, likely related to chronic outlet obstruction 5. Cirrhotic morphology of the liver.  Splenomegaly. 6. Descending and sigmoid diverticulosis without evidence of acute diverticulitis.   These results will be called to the ordering clinician or representative by the Radiologist Assistant, and communication documented in the PACS or [employer].     Electronically Signed By: Jearld Lesch M.D. On: 12/05/2022 17:04   Results for orders placed during the hospital encounter of 02/19/23   CT RENAL STONE STUDY   Narrative CLINICAL DATA:  Urolithiasis symptomatic.   EXAM: CT ABDOMEN AND PELVIS WITHOUT CONTRAST   TECHNIQUE: Multidetector CT imaging of the abdomen and pelvis was performed following the standard protocol without IV contrast.   RADIATION DOSE REDUCTION: This exam was performed according to the departmental dose-optimization program which includes automated exposure control, adjustment of the mA and/or kV according to patient size and/or use of iterative reconstruction technique.   COMPARISON:  CT December 04, 2022   FINDINGS: Lower chest: No acute abnormality.   Hepatobiliary: Cirrhotic hepatic morphology. Gallbladder is unremarkable. No biliary ductal dilation.   Pancreas: No pancreatic ductal dilation or evidence of acute inflammation. Periampullary duodenal diverticulum.   Spleen: Calcified splenic granuloma. Similar mild splenomegaly measuring 15.8 cm in maximum craniocaudal dimension.   Adrenals/Urinary Tract: Bilateral adrenal glands appear normal.   Interval placement of a left double-J ureteral stent with pigtail in the renal pelvis and urinary bladder. Decrease in size and number of the faintly mineralized stones in the left renal pelvis measuring up to 13 mm. Additional densely mineralized  nonobstructive renal stones in the left kidney measure up to 8 mm. No bladder calculus identified.   Stomach/Bowel: No radiopaque enteric contrast material was administered. Stomach is minimally distended by ingested material and gas. No pathologic dilation of small or large bowel. Normal appendix. Colonic diverticulosis without findings of acute diverticulitis.   Vascular/Lymphatic: Normal caliber abdominal aorta. Smooth IVC contours. Retroaortic left renal vein. No pathologically enlarged abdominal or pelvic lymph nodes.   Reproductive: Prostate is unremarkable. Calcifications along the dorsal aspect of the penis are similar prior may reflect sequela of Peyronie's disease.   Other: No significant abdominopelvic free fluid.   Musculoskeletal: Similar mild compression deformity of the L1 vertebral body. Probable vertebral body hemangioma at L1 is unchanged dating back to Feb 13, 2008. Multilevel degenerative change of the spine with marked discogenic disease at L5-S1 producing spinal canal narrowing and bilateral neural foraminal impingement.   IMPRESSION: 1. Interval placement of a left double-J ureteral stent with decrease in size and number of the faintly mineralized stones in the left renal pelvis measuring up to 13 mm. Additional densely mineralized nonobstructive renal stones in the left kidney measure up to 8 mm. No bladder calculus identified. 2. Cirrhotic hepatic morphology with splenomegaly indicative of portal venous hypertension. 3. Colonic diverticulosis without findings of acute diverticulitis.     Electronically Signed By: Maudry Mayhew M.D. On: 02/21/2023 09:01  Assessment & Plan:     1. Nephrolithiasis -We discussed the management of kidney stones. These options include observation, ureteroscopy, shockwave lithotripsy (ESWL) and percutaneous nephrolithotomy (PCNL). We discussed which options are relevant to the patient's stone(s). We discussed the  natural history of kidney stones as well as the complications of untreated stones and the impact on quality of life without treatment as well as with each of the above listed treatments. We also discussed the efficacy of each treatment in its ability to clear the stone burden. With any of these management options I discussed the signs and symptoms of infection and the need for emergent treatment should these be experienced. For each option we discussed the ability of each procedure to clear the patient of their stone burden.   For observation I described the risks which include but are not limited to silent renal damage, life-threatening infection, need for emergent surgery, failure to pass stone and pain.   For ureteroscopy I described the risks which include bleeding, infection, damage to contiguous structures, positioning injury, ureteral stricture, ureteral avulsion, ureteral injury, need for prolonged ureteral stent, inability to perform ureteroscopy, need for an interval procedure, inability to clear stone burden, stent discomfort/pain, heart attack, stroke, pulmonary embolus and the inherent risks with general anesthesia.   For shockwave lithotripsy I described the risks which include arrhythmia, kidney contusion, kidney hemorrhage, need for transfusion, pain, inability to adequately break up stone, inability to pass stone fragments, Steinstrasse, infection associated with obstructing stones, need for alternate surgical procedure, need for repeat shockwave lithotripsy, MI, CVA, PE and the inherent risks with anesthesia/conscious sedation.   For PCNL I described the risks including positioning injury, pneumothorax, hydrothorax, need for chest tube, inability to clear stone burden, renal laceration, arterial venous fistula or malformation, need for embolization of kidney, loss of kidney or renal function, need for repeat procedure, need for prolonged nephrostomy tube, ureteral avulsion, MI, CVA, PE and  the inherent risks of general anesthesia.   - The patient would like to proceed with left ureteroscopic stone extraction - Urinalysis, Routine w reflex microscopic     No follow-ups on file.   Wilkie Aye, MD   [facility] [place]

## 2023-07-01 NOTE — Op Note (Signed)
.  Preoperative diagnosis: Left renal stone  Postoperative diagnosis: Same  Procedure: 1 cystoscopy 2. Left retrograde pyelography 3.  Intraoperative fluoroscopy, under one hour, with interpretation 4.  Left ureteroscopic stone manipulation with laser lithotripsy  Attending: Cleda Mccreedy  Anesthesia: General  Estimated blood loss: None  Drains: none  Specimens: stone for analysis  Antibiotics: ancef  Findings: left mid pole stone. No hydronephrosis. No masses/lesions in the bladder. Ureteral orifices in normal anatomic location.  Indications: Patient is a 62 year old male with a history of left renal stone.  After discussing treatment options, he decided proceed with left ureteroscopic stone manipulation.  Procedure in detail: The patient was brought to the operating room and a brief timeout was done to ensure correct patient, correct procedure, correct site.  General anesthesia was administered patient was placed in dorsal lithotomy position.  Her genitalia was then prepped and draped in usual sterile fashion.  A rigid 22 French cystoscope was passed in the urethra and the bladder.  Bladder was inspected free masses or lesions.  the ureteral orifices were in the normal orthotopic locations.  a 6 french ureteral catheter was then instilled into the left ureteral orifice.  a gentle retrograde was obtained and findings noted above. Using a grasper the left ureteral stent was brought to the urethral meatus.  we then placed a zip wire through the ureteral stent and advanced up to the renal pelvis. We cannulated the left ureteral orifice with a flexible ureteroscope.  No stone was found in the ureter. We then used the flexible ureteroscope to perform nephroscopy. We encountered the stone in the mid pole.   Using a 242nm laser fiber the stone was fragmented and the fragments were then removed with a Ngage basket.    once all stone fragments were removed we then removed the ureteroscope. We  elected not to place a stent since this was an uncomplicated ureteroscopy. the bladder was then drained and this concluded the procedure which was well tolerated by patient.  Complications: None  Condition: Stable, extubated, transferred to PACU  Plan: Patient is to be discharged home as to follow-up in one week

## 2023-07-01 NOTE — Anesthesia Preprocedure Evaluation (Signed)
Anesthesia Evaluation  Patient identified by MRN, date of birth, ID band Patient awake    Reviewed: Allergy & Precautions, H&P , NPO status , Patient's Chart, lab work & pertinent test results, reviewed documented beta blocker date and time   Airway Mallampati: II  TM Distance: >3 FB Neck ROM: full    Dental no notable dental hx.    Pulmonary neg pulmonary ROS, former smoker   Pulmonary exam normal breath sounds clear to auscultation       Cardiovascular Exercise Tolerance: Good hypertension, negative cardio ROS  Rhythm:regular Rate:Normal     Neuro/Psych  Headaches PSYCHIATRIC DISORDERS Anxiety Depression    CVA negative neurological ROS  negative psych ROS   GI/Hepatic negative GI ROS, Neg liver ROS,,,  Endo/Other  negative endocrine ROSdiabetes    Renal/GU Renal diseasenegative Renal ROS  negative genitourinary   Musculoskeletal   Abdominal   Peds  Hematology negative hematology ROS (+)   Anesthesia Other Findings   Reproductive/Obstetrics negative OB ROS                             Anesthesia Physical Anesthesia Plan  ASA: 3  Anesthesia Plan: General   Post-op Pain Management:    Induction:   PONV Risk Score and Plan: Propofol infusion  Airway Management Planned:   Additional Equipment:   Intra-op Plan:   Post-operative Plan:   Informed Consent: I have reviewed the patients History and Physical, chart, labs and discussed the procedure including the risks, benefits and alternatives for the proposed anesthesia with the patient or authorized representative who has indicated his/her understanding and acceptance.     Dental Advisory Given  Plan Discussed with: CRNA  Anesthesia Plan Comments:        Anesthesia Quick Evaluation

## 2023-07-01 NOTE — Anesthesia Procedure Notes (Signed)
Procedure Name: Intubation Date/Time: 07/01/2023 7:49 AM  Performed by: Oletha Cruel, CRNAPre-anesthesia Checklist: Patient identified, Emergency Drugs available, Suction available and Patient being monitored Patient Re-evaluated:Patient Re-evaluated prior to induction Oxygen Delivery Method: Circle system utilized Preoxygenation: Pre-oxygenation with 100% oxygen Induction Type: IV induction Ventilation: Mask ventilation without difficulty Laryngoscope Size: Mac and 4 Grade View: Grade II Tube type: Oral Number of attempts: 1 Airway Equipment and Method: Stylet Placement Confirmation: ETT inserted through vocal cords under direct vision, positive ETCO2, CO2 detector and breath sounds checked- equal and bilateral Secured at: 23 cm Tube secured with: Tape Dental Injury: Teeth and Oropharynx as per pre-operative assessment  Comments: Atraumatic intubation. Teeth and lips remain in preoperative condition.

## 2023-07-05 NOTE — Anesthesia Postprocedure Evaluation (Signed)
Anesthesia Post Note  Patient: Malik Hill  Procedure(s) Performed: CYSTOSCOPY WITH RETROGRADE PYELOGRAM, URETEROSCOPY AND STENT PLACEMENT (Left: Ureter) HOLMIUM LASER APPLICATION (Left: Ureter)  Patient location during evaluation: Phase II Anesthesia Type: General Level of consciousness: awake Pain management: pain level controlled Vital Signs Assessment: post-procedure vital signs reviewed and stable Respiratory status: spontaneous breathing and respiratory function stable Cardiovascular status: blood pressure returned to baseline and stable Postop Assessment: no headache and no apparent nausea or vomiting Anesthetic complications: no Comments: Late entry   No notable events documented.   Last Vitals:  Vitals:   07/01/23 0930 07/01/23 0931  BP:    Pulse: 75   Resp: 15   Temp:  36.5 C  SpO2: 97%     Last Pain:  Vitals:   07/02/23 1117  TempSrc:   PainSc: 0-No pain                 Windell [place]

## 2023-07-09 ENCOUNTER — Encounter (HOSPITAL_COMMUNITY): Payer: Self-pay | Admitting: Urology

## 2023-07-10 ENCOUNTER — Encounter: Payer: Self-pay | Admitting: Urology

## 2023-07-10 LAB — CALCULI, WITH PHOTOGRAPH (CLINICAL LAB)
Calcium Oxalate Monohydrate: 100 %
Weight Calculi: 97 mg

## 2023-07-14 ENCOUNTER — Encounter: Payer: BC Managed Care – PPO | Admitting: Urology

## 2023-07-16 ENCOUNTER — Encounter: Payer: Self-pay | Admitting: Urology

## 2023-07-16 ENCOUNTER — Ambulatory Visit: Payer: BC Managed Care – PPO | Admitting: Urology

## 2023-07-16 VITALS — BP 103/70 | HR 81

## 2023-07-16 DIAGNOSIS — N200 Calculus of kidney: Secondary | ICD-10-CM

## 2023-07-16 LAB — URINALYSIS, ROUTINE W REFLEX MICROSCOPIC
Bilirubin, UA: NEGATIVE
Ketones, UA: NEGATIVE
Leukocytes,UA: NEGATIVE
Nitrite, UA: NEGATIVE
Protein,UA: NEGATIVE
Specific Gravity, UA: 1.025 (ref 1.005–1.030)
Urobilinogen, Ur: 1 mg/dL (ref 0.2–1.0)
pH, UA: 6 (ref 5.0–7.5)

## 2023-07-16 LAB — MICROSCOPIC EXAMINATION: Bacteria, UA: NONE SEEN

## 2023-07-16 NOTE — Patient Instructions (Signed)

## 2023-07-16 NOTE — Progress Notes (Signed)
07/16/2023 9:42 AM   Malik Hill Apr 20, 1961 [id number]  Referring provider: Estanislado Pandy, MD [address] [place],  [place] [address]  Followup nephrolithiasis   HPI: Malik Hill is a 62yo here for followuop for nephrolithiasis. UA shows 3-10 RBCs. He is having mid back pain. He notes his LUTS have improved since the stent was removed.  No other complaints today   PMH: Past Medical History:  Diagnosis Date   Anxiety    BPH (benign prostatic hyperplasia)    Cirrhosis (HCC)    Depression    Diabetes mellitus without complication (HCC)    Headache    History of kidney stones    MDD (major depressive disorder)    Memory loss    Osteoarthritis    Stroke (HCC) 09/2021    Surgical History: Past Surgical History:  Procedure Laterality Date   BIOPSY  06/10/2023   Procedure: BIOPSY;  Surgeon: Franky Macho, MD;  [facility]: [facility];  Service: Endoscopy;;   BUNIONECTOMY     COLONOSCOPY  06/09/2007   COLONOSCOPY WITH PROPOFOL N/A 06/10/2023   Procedure: COLONOSCOPY WITH PROPOFOL;  Surgeon: Franky Macho, MD;  Location: [facility];  Service: Endoscopy;  Laterality: N/A;  9:45am;asa 3   CYSTOSCOPY WITH RETROGRADE PYELOGRAM, URETEROSCOPY AND STENT PLACEMENT Left 01/21/2023   Procedure: CYSTOSCOPY WITH RETROGRADE PYELOGRAM, URETEROSCOPY AND STENT PLACEMENT;  Surgeon: Malen Gauze, MD;  Location: [facility];  Service: Urology;  Laterality: Left;   CYSTOSCOPY WITH RETROGRADE PYELOGRAM, URETEROSCOPY AND STENT PLACEMENT Left 07/01/2023   Procedure: CYSTOSCOPY WITH RETROGRADE PYELOGRAM, URETEROSCOPY AND STENT PLACEMENT;  Surgeon: Malen Gauze, MD;  Location: [facility];  Service: Urology;  Laterality: Left;   ESOPHAGOGASTRODUODENOSCOPY (EGD) WITH PROPOFOL N/A 06/10/2023   Procedure: ESOPHAGOGASTRODUODENOSCOPY (EGD) WITH PROPOFOL;  Surgeon: Franky Macho, MD;  [facility]: [facility];  Service: Endoscopy;  Laterality: N/A;  9:45am;asa 3   HOLMIUM LASER APPLICATION  Left 07/01/2023   Procedure: HOLMIUM LASER APPLICATION;  Surgeon: Malen Gauze, MD;  Location: [facility];  Service: Urology;  Laterality: Left;   KNEE SURGERY Left 2015   PENILE ADHESIONS LYSIS     POLYPECTOMY  06/10/2023   Procedure: POLYPECTOMY;  Surgeon: Franky Macho, MD;  [facility]: [facility];  Service: Endoscopy;;   TONSILLECTOMY AND ADENOIDECTOMY     age 62    Home Medications:  Allergies as of 07/16/2023       Reactions   Percocet [oxycodone-acetaminophen] Other (See Comments)   Hallucinations/Dizziness/sweaty   Penicillins Rash        Medication List        Accurate as of July 16, 2023  9:42 AM. If you have any questions, ask your nurse or doctor.          acetaminophen 500 MG tablet Commonly known as: TYLENOL Take 1,000 mg by mouth every 6 (six) hours as needed for moderate pain or headache.   ALPRAZolam 1 MG tablet Commonly known as: XANAX Take 1 mg by mouth 4 (four) times daily.   atenolol 25 MG tablet Commonly known as: TENORMIN Take 25 mg by mouth 2 (two) times daily.   busPIRone 5 MG tablet Commonly known as: BUSPAR Take 5 mg by mouth 2 (two) times daily.   cetirizine 10 MG tablet Commonly known as: ZYRTEC Take 10 mg by mouth daily as needed for allergies.   cyanocobalamin 1000 MCG tablet Take 1 tablet (1,000 mcg total) by mouth daily. What changed:  how much to take   ezetimibe 10 MG tablet Commonly known as: Zetia Take 1 tablet (10 mg total) by mouth daily.   fluticasone 50 MCG/ACT nasal spray Commonly known as: FLONASE Place 2 sprays into both nostrils daily as needed for allergies or rhinitis.   glipiZIDE 10 MG 24 hr tablet Commonly known as: GLUCOTROL XL Take 10 mg by mouth 2 (two) times daily.   hydrochlorothiazide 12.5 MG capsule Commonly known as: MICROZIDE Take 12.5 mg by mouth daily.   HYDROcodone-acetaminophen 5-325 MG tablet Commonly known as: Norco Take 1 tablet by mouth every 6 (six) hours as needed for  moderate pain.   Jardiance 25 MG Tabs tablet Generic drug: empagliflozin Take 25 mg by mouth daily.   losartan 25 MG tablet Commonly known as: COZAAR Take 25 mg by mouth daily.   memantine 5 MG tablet Commonly known as: Namenda take 5 mg in the morning (1 pill) for one week, then 5 mg twice a day for 1 week, then 10 mg (2 pills) in the morning and 5 mg at bedtime for one week, then 10 mg twice a day   omeprazole 20 MG tablet Commonly known as: PRILOSEC OTC Take 20 mg by mouth daily as needed (acid reflux).   ondansetron 4 MG tablet Commonly known as: Zofran Take 1 tablet (4 mg total) by mouth daily as needed for nausea or vomiting.   PARoxetine 40 MG tablet Commonly known as: PAXIL Take 40 mg by mouth daily.   pyridoxine 100 MG tablet Commonly known as: B-6 Take 1 tablet (100 mg total) by mouth daily.   QUEtiapine 100 MG tablet Commonly known as: SEROQUEL Take 100 mg by mouth at bedtime.   sodium bicarbonate 650 MG tablet Take 1 tablet (650 mg total) by mouth 2 (two) times daily.   tamsulosin 0.4 MG Caps capsule Commonly known as: FLOMAX Take 1 capsule (0.4 mg total) by mouth daily after supper.   traZODone 100 MG tablet Commonly known as: DESYREL Take 100 mg by mouth at bedtime.   Trulicity 1.5 MG/0.5ML Sopn Generic drug: Dulaglutide Inject 1.5 mg into the skin every Wednesday.   Vitamin D (Ergocalciferol) 1.25 MG (50000 UNIT) Caps capsule Commonly known as: DRISDOL Take 1 capsule (50,000 Units total) by mouth every 7 (seven) days.        Allergies:  Allergies  Allergen Reactions   Percocet [Oxycodone-Acetaminophen] Other (See Comments)    Hallucinations/Dizziness/sweaty   Penicillins Rash    Family History: Family History  Problem Relation Age of Onset   Hypertension Mother    Hypothyroidism Mother    Heart attack Father    Cancer Sister     Social History:  reports that he has quit smoking. His smoking use included cigarettes. He has never  used smokeless tobacco. He reports that he does not drink alcohol and does not use drugs.  ROS: All other review of systems were reviewed and are negative except what is noted above in HPI  Physical Exam: BP 103/70   Pulse 81   Constitutional:  Alert and oriented, No acute distress. HEENT: [place] AT, moist mucus membranes.  Trachea midline, no masses. Cardiovascular: No clubbing, cyanosis, or edema. Respiratory: Normal respiratory effort, no increased work of breathing. GI: Abdomen is soft, nontender, nondistended, no abdominal masses GU: No CVA tenderness.  Lymph: No cervical or inguinal lymphadenopathy. Skin: No rashes, bruises or suspicious lesions. Neurologic: Grossly intact, no focal deficits, moving all 4 extremities. Psychiatric: Normal mood and affect.  Laboratory Data: Lab Results  Component Value Date   WBC 4.4 06/08/2023   HGB 15.1 06/08/2023   HCT 46.7 06/08/2023   MCV 87.1 06/08/2023   PLT 128 (L) 06/08/2023    Lab Results  Component Value Date   CREATININE 1.20 05/18/2023    No results found for: "PSA"  No results found for: "TESTOSTERONE"  Lab Results  Component Value Date   HGBA1C 7.0 (H) 01/18/2023    Urinalysis    Component Value Date/Time   COLORURINE YELLOW 09/25/2021 1411   APPEARANCEUR Clear 05/05/2023 1431   LABSPEC 1.015 09/25/2021 1411   PHURINE 5.5 09/25/2021 1411   GLUCOSEU 3+ (A) 05/05/2023 1431   HGBUR LARGE (A) 09/25/2021 1411   BILIRUBINUR Negative 05/05/2023 1431   KETONESUR 15 (A) 09/25/2021 1411   PROTEINUR Negative 05/05/2023 1431   PROTEINUR TRACE (A) 09/25/2021 1411   UROBILINOGEN 0.2 02/17/2008 1558   NITRITE Negative 05/05/2023 1431   NITRITE NEGATIVE 09/25/2021 1411   LEUKOCYTESUR Negative 05/05/2023 1431   LEUKOCYTESUR NEGATIVE 09/25/2021 1411    Lab Results  Component Value Date   LABMICR Comment 05/05/2023   WBCUA 0-5 03/17/2023   LABEPIT 0-10 03/17/2023   BACTERIA None seen 03/17/2023    Pertinent  Imaging:  No results found for this or any previous visit.  No results found for this or any previous visit.  No results found for this or any previous visit.  No results found for this or any previous visit.  No results found for this or any previous visit.  No valid procedures specified. Results for orders placed during the hospital encounter of 12/04/22  CT HEMATURIA WORKUP  Narrative CLINICAL DATA:  Gross hematuria * Tracking Code: BO *  EXAM: CT ABDOMEN AND PELVIS WITHOUT AND WITH CONTRAST  TECHNIQUE: Multidetector CT imaging of the abdomen and pelvis was performed following the standard protocol before and following the bolus administration of intravenous contrast.  RADIATION DOSE REDUCTION: This exam was performed according to the departmental dose-optimization program which includes automated exposure control, adjustment of the mA and/or kV according to patient size and/or use of iterative reconstruction technique.  CONTRAST:  OMNIPAQUE IOHEXOL 350 MG/ML SOLN  COMPARISON:  02/13/2008  FINDINGS: Lower chest: No acute abnormality.  Hepatobiliary: No solid liver abnormality is seen. Coarse, nodular contour of the liver. No gallstones, gallbladder wall thickening, or biliary dilatation.  Pancreas: Unremarkable. No pancreatic ductal dilatation or surrounding inflammatory changes.  Spleen: Splenomegaly, maximum coronal span 16.5 cm.  Adrenals/Urinary Tract: Adrenal glands are unremarkable. Numerous left renal calculi. Heterogeneous, expansile and faintly calcified appearance of the left renal pelvis filling defect measuring 4.9 x 2.9 x 1.8 cm (series 10, image 64, series 7, image 52). The right kidney is normal, without renal calculi, solid lesion, or hydronephrosis. Mild wall thickening of the urinary bladder.  Stomach/Bowel: Stomach is within normal limits. Diverticulum of the descending duodenum. Appendix appears normal. No evidence of bowel wall  thickening, distention, or inflammatory changes. Descending and sigmoid diverticulosis.  Vascular/Lymphatic: No significant vascular findings are present. No enlarged abdominal or pelvic lymph nodes.  Reproductive: No mass or other significant abnormality.  Other: No abdominal wall hernia or abnormality. No ascites.  Musculoskeletal: No acute or significant osseous findings.  IMPRESSION: 1. Numerous left renal calculi. 2. Heterogeneous, expansile and faintly calcified appearance of the left renal pelvis filling defect measuring 4.9 x 2.9 x 1.8 cm. This may reflect a large, partially calcified staghorn calculus but appearance is somewhat concerning for urothelial  malignancy. 3. No evidence of lymphadenopathy or metastatic disease in the abdomen or pelvis. 4. Mild wall thickening of the urinary bladder, likely related to chronic outlet obstruction 5. Cirrhotic morphology of the liver.  Splenomegaly. 6. Descending and sigmoid diverticulosis without evidence of acute diverticulitis.  These results will be called to the ordering clinician or representative by the Radiologist Assistant, and communication documented in the PACS or [employer].   Electronically Signed By: Jearld Lesch M.D. On: 12/05/2022 17:04  Results for orders placed during the hospital encounter of 05/03/23  CT RENAL STONE STUDY  Narrative CLINICAL DATA:  62 year old male history of kidney stones. Evaluate for stone burden following treatment.  EXAM: CT ABDOMEN AND PELVIS WITHOUT CONTRAST  TECHNIQUE: Multidetector CT imaging of the abdomen and pelvis was performed following the standard protocol without IV contrast.  RADIATION DOSE REDUCTION: This exam was performed according to the departmental dose-optimization program which includes automated exposure control, adjustment of the mA and/or kV according to patient size and/or use of iterative reconstruction technique.  COMPARISON:  CT the  abdomen and pelvis 02/19/2023.  FINDINGS: Lower chest: Atherosclerotic calcifications are noted in the left anterior descending, left circumflex and right coronary arteries.  Hepatobiliary: Liver has a shrunken appearance and nodular contour, indicative of underlying cirrhosis. No discrete cystic or solid hepatic lesions are confidently identified on today's noncontrast CT examination. Unenhanced appearance of the gallbladder is unremarkable.  Pancreas: No definite pancreatic mass or peripancreatic fluid collections or inflammatory changes are noted on today's noncontrast CT examination.  Spleen: Calcified granuloma in the spleen.  Otherwise, unremarkable.  Adrenals/Urinary Tract: Left-sided double-J ureteral stent noted with proximal loop reformed in the left renal pelvis and distal loop reformed in the urinary bladder. Nonobstructive calculi are noted in the left renal collecting system, decreased in number compared to the prior study. The largest residual calculus is at the junction of interpolar and lower pole regions (coronal image 66 of series 5) measuring 7 mm. The majority of the previously noted poorly defined calculi in the left renal collecting system are no longer evident, presumably have task compared to the prior study. No right-sided nephrolithiasis is noted. No definite calculus confidently identified along the course of either ureter or within the lumen of the urinary bladder. No hydroureteronephrosis. Unenhanced appearance of the right kidney and bilateral adrenal glands is normal. Urinary bladder is unremarkable in appearance.  Stomach/Bowel: Unenhanced appearance of the stomach is normal. No pathologic dilatation of small bowel or colon. A few scattered colonic diverticula are noted, without surrounding inflammatory changes to suggest an acute diverticulitis at this time. Normal appendix.  Vascular/Lymphatic: Atherosclerotic calcifications are noted in  the abdominal aorta. Retroaortic left renal vein (normal anatomical variant) incidentally noted. No lymphadenopathy noted in the abdomen or pelvis.  Reproductive: Prostate gland and seminal vesicles are unremarkable in appearance.  Other: No significant volume of ascites.  No pneumoperitoneum.  Musculoskeletal: There are no aggressive appearing lytic or blastic lesions noted in the visualized portions of the skeleton.  IMPRESSION: 1. Stable position of left-sided double-J ureteral stent with overall substantially decreased burden of stones within the left renal collecting system compared to the prior examination, as detailed above. Persistent nonobstructive calculi are present within the left renal collecting system measuring up to 7 mm. 2. Cirrhosis. 3. Colonic diverticulosis without evidence of acute diverticulitis at this time. 4. Aortic atherosclerosis, in addition to at least three-vessel coronary artery disease. Please note that although the presence of coronary artery calcium documents the  presence of coronary artery disease, the severity of this disease and any potential stenosis cannot be assessed on this non-gated CT examination. Assessment for potential risk factor modification, dietary therapy or pharmacologic therapy may be warranted, if clinically indicated.   Electronically Signed By: Trudie Reed M.D. On: 05/10/2023 06:13   Assessment & Plan:    1. Nephrolithiasis -followup 6 weeks with renal US  -continue sodium bicarbonate.  - Urinalysis, Routine w reflex microscopic   No follow-ups on file.  Wilkie Aye, MD  [facility] [place]

## 2023-07-21 ENCOUNTER — Ambulatory Visit (HOSPITAL_COMMUNITY)
Admission: RE | Admit: 2023-07-21 | Discharge: 2023-07-21 | Disposition: A | Discharge status: Home / Self Care | Payer: BC Managed Care – PPO | Source: Ambulatory Visit | Attending: Urology | Admitting: Urology

## 2023-07-21 DIAGNOSIS — N200 Calculus of kidney: Secondary | ICD-10-CM | POA: Diagnosis present

## 2023-08-12 ENCOUNTER — Encounter: Payer: BC Managed Care – PPO | Admitting: Psychology

## 2023-09-01 ENCOUNTER — Ambulatory Visit: Payer: BC Managed Care – PPO | Admitting: Urology

## 2023-09-01 VITALS — BP 87/61 | HR 106

## 2023-09-01 DIAGNOSIS — Z09 Encounter for follow-up examination after completed treatment for conditions other than malignant neoplasm: Secondary | ICD-10-CM

## 2023-09-01 DIAGNOSIS — N200 Calculus of kidney: Secondary | ICD-10-CM

## 2023-09-01 DIAGNOSIS — Z87442 Personal history of urinary calculi: Secondary | ICD-10-CM

## 2023-09-01 LAB — URINALYSIS, ROUTINE W REFLEX MICROSCOPIC
Bilirubin, UA: NEGATIVE
Ketones, UA: NEGATIVE
Leukocytes,UA: NEGATIVE
Nitrite, UA: NEGATIVE
Protein,UA: NEGATIVE
RBC, UA: NEGATIVE
Specific Gravity, UA: 1.02 (ref 1.005–1.030)
Urobilinogen, Ur: 1 mg/dL (ref 0.2–1.0)
pH, UA: 6 (ref 5.0–7.5)

## 2023-09-01 MED ORDER — SODIUM BICARBONATE 650 MG PO TABS: 650.0000 mg | ORAL_TABLET | Freq: Two times a day (BID) | ORAL | 11 refills | Status: DC

## 2023-09-01 NOTE — Progress Notes (Signed)
09/01/2023 10:19 AM   Malik Hill Mar 21, 1961 [id number]  Referring provider: Estanislado Pandy, MD [address] [place],  [place] [address]  Followup nephrolithiasis   HPI: Malik Hill is a 62yo here for followup for nephrolithiasis. No stone events since last visit. Renal US shows no calculi and mild left renal fullness. He is on sodium bicarb. BP low today and he is instructed to contact PCP    PMH: Past Medical History:  Diagnosis Date   Anxiety    BPH (benign prostatic hyperplasia)    Cirrhosis (HCC)    Depression    Diabetes mellitus without complication (HCC)    Headache    History of kidney stones    MDD (major depressive disorder)    Memory loss    Osteoarthritis    Stroke (HCC) 09/2021    Surgical History: Past Surgical History:  Procedure Laterality Date   BIOPSY  06/10/2023   Procedure: BIOPSY;  Surgeon: Franky Macho, MD;  [facility]: [facility];  Service: Endoscopy;;   BUNIONECTOMY     COLONOSCOPY  06/09/2007   COLONOSCOPY WITH PROPOFOL N/A 06/10/2023   Procedure: COLONOSCOPY WITH PROPOFOL;  Surgeon: Franky Macho, MD;  [facility]: [facility];  Service: Endoscopy;  Laterality: N/A;  9:45am;asa 3   CYSTOSCOPY WITH RETROGRADE PYELOGRAM, URETEROSCOPY AND STENT PLACEMENT Left 01/21/2023   Procedure: CYSTOSCOPY WITH RETROGRADE PYELOGRAM, URETEROSCOPY AND STENT PLACEMENT;  Surgeon: Malen Gauze, MD;  Location: [facility];  Service: Urology;  Laterality: Left;   CYSTOSCOPY WITH RETROGRADE PYELOGRAM, URETEROSCOPY AND STENT PLACEMENT Left 07/01/2023   Procedure: CYSTOSCOPY WITH RETROGRADE PYELOGRAM, URETEROSCOPY AND STENT PLACEMENT;  Surgeon: Malen Gauze, MD;  Location: [facility];  Service: Urology;  Laterality: Left;   ESOPHAGOGASTRODUODENOSCOPY (EGD) WITH PROPOFOL N/A 06/10/2023   Procedure: ESOPHAGOGASTRODUODENOSCOPY (EGD) WITH PROPOFOL;  Surgeon: Franky Macho, MD;  [facility];  Service: Endoscopy;  Laterality: N/A;  9:45am;asa 3    HOLMIUM LASER APPLICATION Left 07/01/2023   Procedure: HOLMIUM LASER APPLICATION;  Surgeon: Malen Gauze, MD;  Location: [facility];  Service: Urology;  Laterality: Left;   KNEE SURGERY Left 2015   PENILE ADHESIONS LYSIS     POLYPECTOMY  06/10/2023   Procedure: POLYPECTOMY;  Surgeon: Franky Macho, MD;  [facility];  Service: Endoscopy;;   TONSILLECTOMY AND ADENOIDECTOMY     age 21    Home Medications:  Allergies as of 09/01/2023       Reactions   Percocet [oxycodone-acetaminophen] Other (See Comments)   Hallucinations/Dizziness/sweaty   Penicillins Rash        Medication List        Accurate as of September 01, 2023 10:19 AM. If you have any questions, ask your nurse or doctor.          acetaminophen 500 MG tablet Commonly known as: TYLENOL Take 1,000 mg by mouth every 6 (six) hours as needed for moderate pain or headache.   ALPRAZolam 1 MG tablet Commonly known as: XANAX Take 1 mg by mouth 4 (four) times daily.   atenolol 25 MG tablet Commonly known as: TENORMIN Take 25 mg by mouth 2 (two) times daily.   busPIRone 5 MG tablet Commonly known as: BUSPAR Take 5 mg by mouth 2 (two) times daily.   cetirizine 10 MG tablet Commonly known as: ZYRTEC Take 10 mg by mouth daily as needed for allergies.   cyanocobalamin 1000 MCG tablet Take 1 tablet (1,000 mcg total)  by mouth daily. What changed: how much to take   ezetimibe 10 MG tablet Commonly known as: Zetia Take 1 tablet (10 mg total) by mouth daily.   fluticasone 50 MCG/ACT nasal spray Commonly known as: FLONASE Place 2 sprays into both nostrils daily as needed for allergies or rhinitis.   glipiZIDE 10 MG 24 hr tablet Commonly known as: GLUCOTROL XL Take 10 mg by mouth 2 (two) times daily.   hydrochlorothiazide 12.5 MG capsule Commonly known as: MICROZIDE Take 12.5 mg by mouth daily.   HYDROcodone-acetaminophen 5-325 MG tablet Commonly known as: Norco Take 1 tablet by mouth every 6  (six) hours as needed for moderate pain.   Jardiance 25 MG Tabs tablet Generic drug: empagliflozin Take 25 mg by mouth daily.   losartan 25 MG tablet Commonly known as: COZAAR Take 25 mg by mouth daily.   memantine 5 MG tablet Commonly known as: Namenda take 5 mg in the morning (1 pill) for one week, then 5 mg twice a day for 1 week, then 10 mg (2 pills) in the morning and 5 mg at bedtime for one week, then 10 mg twice a day   omeprazole 20 MG tablet Commonly known as: PRILOSEC OTC Take 20 mg by mouth daily as needed (acid reflux).   ondansetron 4 MG tablet Commonly known as: Zofran Take 1 tablet (4 mg total) by mouth daily as needed for nausea or vomiting.   PARoxetine 40 MG tablet Commonly known as: PAXIL Take 40 mg by mouth daily.   pyridoxine 100 MG tablet Commonly known as: B-6 Take 1 tablet (100 mg total) by mouth daily.   QUEtiapine 100 MG tablet Commonly known as: SEROQUEL Take 100 mg by mouth at bedtime.   sodium bicarbonate 650 MG tablet Take 1 tablet (650 mg total) by mouth 2 (two) times daily.   tamsulosin 0.4 MG Caps capsule Commonly known as: FLOMAX Take 1 capsule (0.4 mg total) by mouth daily after supper.   traZODone 100 MG tablet Commonly known as: DESYREL Take 100 mg by mouth at bedtime.   Trulicity 1.5 MG/0.5ML Soaj Generic drug: Dulaglutide Inject 1.5 mg into the skin every Wednesday.   Vitamin D (Ergocalciferol) 1.25 MG (50000 UNIT) Caps capsule Commonly known as: DRISDOL Take 1 capsule (50,000 Units total) by mouth every 7 (seven) days.        Allergies:  Allergies  Allergen Reactions   Percocet [Oxycodone-Acetaminophen] Other (See Comments)    Hallucinations/Dizziness/sweaty   Penicillins Rash    Family History: Family History  Problem Relation Age of Onset   Hypertension Mother    Hypothyroidism Mother    Heart attack Father    Cancer Sister     Social History:  reports that he has quit smoking. His smoking use included  cigarettes. He has never used smokeless tobacco. He reports that he does not drink alcohol and does not use drugs.  ROS: All other review of systems were reviewed and are negative except what is noted above in HPI  Physical Exam: BP (!) 87/61   Pulse (!) 106   Constitutional:  Alert and oriented, No acute distress. HEENT: [place] AT, moist mucus membranes.  Trachea midline, no masses. Cardiovascular: No clubbing, cyanosis, or edema. Respiratory: Normal respiratory effort, no increased work of breathing. GI: Abdomen is soft, nontender, nondistended, no abdominal masses GU: No CVA tenderness.  Lymph: No cervical or inguinal lymphadenopathy. Skin: No rashes, bruises or suspicious lesions. Neurologic: Grossly intact, no focal deficits, moving all 4  extremities. Psychiatric: Normal mood and affect.  Laboratory Data: Lab Results  Component Value Date   WBC 4.4 06/08/2023   HGB 15.1 06/08/2023   HCT 46.7 06/08/2023   MCV 87.1 06/08/2023   PLT 128 (L) 06/08/2023    Lab Results  Component Value Date   CREATININE 1.20 05/18/2023    No results found for: "PSA"  No results found for: "TESTOSTERONE"  Lab Results  Component Value Date   HGBA1C 7.0 (H) 01/18/2023    Urinalysis    Component Value Date/Time   COLORURINE YELLOW 09/25/2021 1411   APPEARANCEUR Clear 07/16/2023 0936   LABSPEC 1.015 09/25/2021 1411   PHURINE 5.5 09/25/2021 1411   GLUCOSEU 3+ (A) 07/16/2023 0936   HGBUR LARGE (A) 09/25/2021 1411   BILIRUBINUR Negative 07/16/2023 0936   KETONESUR 15 (A) 09/25/2021 1411   PROTEINUR Negative 07/16/2023 0936   PROTEINUR TRACE (A) 09/25/2021 1411   UROBILINOGEN 0.2 02/17/2008 1558   NITRITE Negative 07/16/2023 0936   NITRITE NEGATIVE 09/25/2021 1411   LEUKOCYTESUR Negative 07/16/2023 0936   LEUKOCYTESUR NEGATIVE 09/25/2021 1411    Lab Results  Component Value Date   LABMICR See below: 07/16/2023   WBCUA 0-5 07/16/2023   LABEPIT 0-10 07/16/2023   BACTERIA None seen  07/16/2023    Pertinent Imaging:  No results found for this or any previous visit.  No results found for this or any previous visit.  No results found for this or any previous visit.  No results found for this or any previous visit.  Results for orders placed during the hospital encounter of 07/21/23  Ultrasound renal complete  Narrative CLINICAL DATA:  Nephrolithiasis follow-up.  EXAM: RENAL / URINARY TRACT ULTRASOUND COMPLETE  COMPARISON:  CT abdomen pelvis 05/03/2023  FINDINGS: Right Kidney:  Renal measurements: 12.7 x 5.1 x 6.6 cm = volume: 221.0 mL. Echogenicity within normal limits. No mass or hydronephrosis visualized.  Left Kidney:  Renal measurements: 13.4 x 5.9 x 6.4 cm = volume: 286.1 mL. Normal renal cortical thickness and echogenicity. Mild pelviectasis. No mass.  Bladder:  Appears normal for degree of bladder distention.  Other:  None.  IMPRESSION: Mild left pelviectasis.   Electronically Signed By: Annia Belt M.D. On: 07/21/2023 15:32  No valid procedures specified. Results for orders placed during the hospital encounter of 12/04/22  CT HEMATURIA WORKUP  Narrative CLINICAL DATA:  Gross hematuria * Tracking Code: BO *  EXAM: CT ABDOMEN AND PELVIS WITHOUT AND WITH CONTRAST  TECHNIQUE: Multidetector CT imaging of the abdomen and pelvis was performed following the standard protocol before and following the bolus administration of intravenous contrast.  RADIATION DOSE REDUCTION: This exam was performed according to the departmental dose-optimization program which includes automated exposure control, adjustment of the mA and/or kV according to patient size and/or use of iterative reconstruction technique.  CONTRAST:  OMNIPAQUE IOHEXOL 350 MG/ML SOLN  COMPARISON:  02/13/2008  FINDINGS: Lower chest: No acute abnormality.  Hepatobiliary: No solid liver abnormality is seen. Coarse, nodular contour of the liver. No  gallstones, gallbladder wall thickening, or biliary dilatation.  Pancreas: Unremarkable. No pancreatic ductal dilatation or surrounding inflammatory changes.  Spleen: Splenomegaly, maximum coronal span 16.5 cm.  Adrenals/Urinary Tract: Adrenal glands are unremarkable. Numerous left renal calculi. Heterogeneous, expansile and faintly calcified appearance of the left renal pelvis filling defect measuring 4.9 x 2.9 x 1.8 cm (series 10, image 64, series 7, image 52). The right kidney is normal, without renal calculi, solid lesion, or hydronephrosis. Mild wall thickening  of the urinary bladder.  Stomach/Bowel: Stomach is within normal limits. Diverticulum of the descending duodenum. Appendix appears normal. No evidence of bowel wall thickening, distention, or inflammatory changes. Descending and sigmoid diverticulosis.  Vascular/Lymphatic: No significant vascular findings are present. No enlarged abdominal or pelvic lymph nodes.  Reproductive: No mass or other significant abnormality.  Other: No abdominal wall hernia or abnormality. No ascites.  Musculoskeletal: No acute or significant osseous findings.  IMPRESSION: 1. Numerous left renal calculi. 2. Heterogeneous, expansile and faintly calcified appearance of the left renal pelvis filling defect measuring 4.9 x 2.9 x 1.8 cm. This may reflect a large, partially calcified staghorn calculus but appearance is somewhat concerning for urothelial malignancy. 3. No evidence of lymphadenopathy or metastatic disease in the abdomen or pelvis. 4. Mild wall thickening of the urinary bladder, likely related to chronic outlet obstruction 5. Cirrhotic morphology of the liver.  Splenomegaly. 6. Descending and sigmoid diverticulosis without evidence of acute diverticulitis.  These results will be called to the ordering clinician or representative by the Radiologist Assistant, and communication documented in the PACS or [employer].   Electronically Signed By: Jearld Lesch M.D. On: 12/05/2022 17:04  Results for orders placed during the hospital encounter of 05/03/23  CT RENAL STONE STUDY  Narrative CLINICAL DATA:  62 year old male history of kidney stones. Evaluate for stone burden following treatment.  EXAM: CT ABDOMEN AND PELVIS WITHOUT CONTRAST  TECHNIQUE: Multidetector CT imaging of the abdomen and pelvis was performed following the standard protocol without IV contrast.  RADIATION DOSE REDUCTION: This exam was performed according to the departmental dose-optimization program which includes automated exposure control, adjustment of the mA and/or kV according to patient size and/or use of iterative reconstruction technique.  COMPARISON:  CT the abdomen and pelvis 02/19/2023.  FINDINGS: Lower chest: Atherosclerotic calcifications are noted in the left anterior descending, left circumflex and right coronary arteries.  Hepatobiliary: Liver has a shrunken appearance and nodular contour, indicative of underlying cirrhosis. No discrete cystic or solid hepatic lesions are confidently identified on today's noncontrast CT examination. Unenhanced appearance of the gallbladder is unremarkable.  Pancreas: No definite pancreatic mass or peripancreatic fluid collections or inflammatory changes are noted on today's noncontrast CT examination.  Spleen: Calcified granuloma in the spleen.  Otherwise, unremarkable.  Adrenals/Urinary Tract: Left-sided double-J ureteral stent noted with proximal loop reformed in the left renal pelvis and distal loop reformed in the urinary bladder. Nonobstructive calculi are noted in the left renal collecting system, decreased in number compared to the prior study. The largest residual calculus is at the junction of interpolar and lower pole regions (coronal image 66 of series 5) measuring 7 mm. The majority of the previously noted poorly defined calculi in the left  renal collecting system are no longer evident, presumably have task compared to the prior study. No right-sided nephrolithiasis is noted. No definite calculus confidently identified along the course of either ureter or within the lumen of the urinary bladder. No hydroureteronephrosis. Unenhanced appearance of the right kidney and bilateral adrenal glands is normal. Urinary bladder is unremarkable in appearance.  Stomach/Bowel: Unenhanced appearance of the stomach is normal. No pathologic dilatation of small bowel or colon. A few scattered colonic diverticula are noted, without surrounding inflammatory changes to suggest an acute diverticulitis at this time. Normal appendix.  Vascular/Lymphatic: Atherosclerotic calcifications are noted in the abdominal aorta. Retroaortic left renal vein (normal anatomical variant) incidentally noted. No lymphadenopathy noted in the abdomen or pelvis.  Reproductive: Prostate gland and seminal  vesicles are unremarkable in appearance.  Other: No significant volume of ascites.  No pneumoperitoneum.  Musculoskeletal: There are no aggressive appearing lytic or blastic lesions noted in the visualized portions of the skeleton.  IMPRESSION: 1. Stable position of left-sided double-J ureteral stent with overall substantially decreased burden of stones within the left renal collecting system compared to the prior examination, as detailed above. Persistent nonobstructive calculi are present within the left renal collecting system measuring up to 7 mm. 2. Cirrhosis. 3. Colonic diverticulosis without evidence of acute diverticulitis at this time. 4. Aortic atherosclerosis, in addition to at least three-vessel coronary artery disease. Please note that although the presence of coronary artery calcium documents the presence of coronary artery disease, the severity of this disease and any potential stenosis cannot be assessed on this non-gated CT examination.  Assessment for potential risk factor modification, dietary therapy or pharmacologic therapy may be warranted, if clinically indicated.   Electronically Signed By: Trudie Reed M.D. On: 05/10/2023 06:13   Assessment & Plan:    1. Nephrolithiasis -followup 6 months with renal US - Urinalysis, Routine w reflex microscopic   No follow-ups on file.  Wilkie Aye, MD  [facility] [place]

## 2023-09-04 ENCOUNTER — Encounter: Payer: Self-pay | Admitting: Urology

## 2023-09-04 NOTE — Patient Instructions (Signed)

## 2023-10-19 NOTE — ED Provider Notes (Addendum)
 Chief complaint:  Chief Complaint  Patient presents with  . Headache New Onset or New Symptoms    History is obtained from patient  HPI: This is a 63 y.o. male presenting to the ED with multiple falls, HA, vision changes.  This is worsened over the past week.  Has been present in total since November when patient fell and had a concussion.  He states he does feel as though he is having poor peripheral vision in the right eye which has worsened.  No recent fall no head injury.  He also reports difficulty getting out of chair.  Has seen a neurologist who believed that he was in the early stages of Alzheimer's and had also done some testing on him.  He has a neurologist appointment with Dr. SHAUNNA at [facility] neurology next month.  He has seen Dr. Atilano and has been evaluated closely.  A friend who is a [employer] encouraged him to present today due to his symptoms.  Review of Systems  All other systems reviewed and are negative.   Allergies: is allergic to oxycodone -acetaminophen  and penicillins. Medications: is not on any long-term medications. PMHx:  has a past medical history of Anxiety, Depression, Diabetes mellitus (CMS-HCC), Hypertension, and Stroke (CMS-HCC). PSHx:  has no past surgical history on file. SocHx:   Allergies, Medications, Medical, Surgical, and Social History were reviewed as documented above.  Physical Exam Vitals and nursing note reviewed.  Constitutional:      General: He is not in acute distress. HENT:     Head: Normocephalic and atraumatic.     Nose: Nose normal.     Mouth/Throat:     Mouth: Mucous membranes are moist.  Eyes:     Extraocular Movements: Extraocular movements intact.     Pupils: Pupils are equal, round, and reactive to light.  Cardiovascular:     Rate and Rhythm: Normal rate and regular rhythm.     Pulses: Normal pulses.     Heart sounds: Normal heart sounds.  Pulmonary:     Effort: Pulmonary effort is normal. No respiratory distress.     Breath  sounds: Normal breath sounds. No wheezing, rhonchi or rales.  Abdominal:     General: Abdomen is flat.     Palpations: Abdomen is soft.     Tenderness: There is no abdominal tenderness. There is no guarding or rebound.  Musculoskeletal:        General: No deformity.  Lymphadenopathy:     Cervical: No cervical adenopathy.  Skin:    General: Skin is warm and dry.     Capillary Refill: Capillary refill takes less than 2 seconds.     Findings: No rash.  Neurological:     General: No focal deficit present.     Mental Status: He is alert and oriented to person, place, and time. Mental status is at baseline.     Cranial Nerves: No cranial nerve deficit.     Sensory: No sensory deficit.     Motor: No weakness.  Psychiatric:        Mood and Affect: Mood normal.        Behavior: Behavior normal.    Vitals: BP 146/89   Pulse 80   Temp 36.6 C (97.8 F) (Oral)   Resp 16   SpO2 96%   ED Results Results for orders placed or performed during the hospital encounter of 10/19/23  Influenza/ RSV/COVID PCR   Specimen: Nasopharyngeal Swab  Result Value Ref Range   SARS-CoV-2 PCR  Negative Negative   Influenza A Negative Negative   Influenza B Negative Negative   RSV Negative Negative  Comprehensive Metabolic Panel  Result Value Ref Range   Sodium 140 135 - 145 mmol/L   Potassium 3.7 3.5 - 5.0 mmol/L   Chloride 102 98 - 107 mmol/L   CO2 24.7 21.0 - 32.0 mmol/L   Anion Gap 13 (H) 3 - 11 mmol/L   BUN 11 8 - 20 mg/dL   Creatinine 8.94 9.19 - 1.30 mg/dL   BUN/Creatinine Ratio 10    eGFR CKD-EPI (2021) Male 80 >=60 mL/min/1.50m2   Glucose 162 70 - 179 mg/dL   Calcium  8.4 (L) 8.5 - 10.1 mg/dL   Albumin  3.6 3.5 - 5.0 g/dL   Total Protein 7.3 6.0 - 8.0 g/dL   Total Bilirubin 0.7 0.3 - 1.2 mg/dL   AST 20 15 - 40 U/L   ALT 23 12 - 78 U/L   Alkaline Phosphatase 79 46 - 116 U/L  Magnesium  Result Value Ref Range   Magnesium 1.9 1.6 - 2.6 mg/dL  PT-INR  Result Value Ref Range   PT 12.1 9.9  - 12.6 sec   INR 1.09 Undefined  Sedimentation rate, manual  Result Value Ref Range   Sed Rate 9 (L) 15 - 20 mm/h  C-reactive protein  Result Value Ref Range   CRP <4.0 <=10.0 mg/L  TSH  Result Value Ref Range   TSH 1.589 0.550 - 4.780 uIU/mL  ECG 12 Lead  Result Value Ref Range   EKG Systolic BP  mmHg   EKG Diastolic BP  mmHg   EKG Ventricular Rate 87 BPM   EKG Atrial Rate 87 BPM   EKG P-R Interval 202 ms   EKG QRS Duration 86 ms   EKG Q-T Interval 378 ms   EKG QTC Calculation 454 ms   EKG Calculated P Axis 27 degrees   EKG Calculated R Axis 36 degrees   EKG Calculated T Axis 40 degrees   QTC Fredericia 427 ms  CBC w/ Differential  Result Value Ref Range   WBC 3.7 (L) 4.0 - 10.5 10*9/L   RBC 5.08 4.10 - 5.60 10*12/L   HGB 14.6 12.5 - 17.0 g/dL   HCT 56.9 63.9 - 49.9 %   MCV 84.6 80.0 - 98.0 fL   MCH 28.7 27.0 - 34.0 pg   MCHC 34.0 32.0 - 36.0 g/dL   RDW 86.7 88.4 - 85.4 %   MPV 9.3 7.4 - 10.4 fL   Platelet 115 (L) 140 - 415 10*9/L   Neutrophils % 52.8 %   Lymphocytes % 34.5 %   Monocytes % 11.1 %   Eosinophils % 1.3 %   Basophils % 0.0 %   Absolute Neutrophils 2.0 1.8 - 7.8 10*9/L   Absolute Lymphocytes 1.3 0.7 - 4.5 10*9/L   Absolute Monocytes 0.4 0.1 - 1.0 10*9/L   Absolute Eosinophils 0.1 0.0 - 0.4 10*9/L   Absolute Basophils 0.0 0.0 - 0.2 10*9/L  Urinalysis with Microscopy with Culture Reflex  Result Value Ref Range   Color, UA Light Yellow    Clarity, UA Clear Clear   Specific Gravity, UA 1.010 1.010 - 1.025   pH, UA 6.0 5.0 - 8.0   Leukocyte Esterase, UA Negative Negative   Nitrite, UA Negative Negative   Protein, UA Negative Negative   Glucose, UA >1000 mg/dL (A) Negative   Ketones, UA Negative Negative   Urobilinogen, UA 0.2 mg/dL 0.1 - 1.0 mg/dL  Bilirubin, UA Negative Negative   Blood, UA Negative Negative   RBC, UA 0 0 - 5 /HPF   WBC, UA 0 <=5 /HPF   Squam Epithel, UA 0 0 - 10 /HPF   Bacteria, UA None Seen Few, Occasional, Small, None Seen  /HPF   No results found.   Medications Administered:  Medications  ketorolac (TORADOL) injection 15 mg (15 mg Intravenous Given 10/19/23 1536)  prochlorperazine (COMPAZINE) injection 5 mg (5 mg Intravenous Given 10/19/23 1538)  diphenhydrAMINE (BENADRYL) injection (25 mg Intravenous Given 10/19/23 1540)    Procedures  The results of any testing as well as any treatment done today in the Emergency Department were communicated to the patient and/or their family/caregiver (if present). The patient/family/caregiver verbalized understanding and compliance with the treatment plan and reasons to return immediately to the ED. All of their questions were answered at bedside  Medical Decision Making:   Medical Decision Making This is a 63 y.o. male presenting with altered mental status, concerning for progressing dementia and gait instability. The differential includes toxic metabolic etiologies such as electrolyte disturbances (Na/Ca), hypoglycemia, and uremia; acidosis states, infection (i.e. Sepsis); toxidromes of intoxication or withdrawal, hypoxemia or hypercarbia, liver disease or failure causing hepatic encephalopathy, endocrine emergencies (hyper/hypothyroidism, adrenal insufficiency), seizure, trauma, intracranial bleeds or ischemic stroke.  Laboratory studies were reviewed and showed no leukocytosis or anemia. Electrolytes, LFTs, and kidney function were generally within normal limits. Urinalysis was without evidence of infection.  Chest x-ray and CT head are obtained and are negative. Images were interpreted by myself prior to final radiologist report.  Radiologist final report confirms my interpretation. Patient is well-appearing.  Patient does have a neurologist appointment upcoming.  He is well followed by his primary care doctor Dr. Atilano.  At this point patient is appropriate for discharge Results have been discussed with patient in detail. Questions solicited and answers given to  satisfaction. The patient understands that at this time there is no evidence for a more malignant underlying process, but the patient also understands that early in the process of an illness, an initial workup can be falsely reassuring and delayed complications can occur.  Routine discharge counseling was given to the patient and the patient understands that worsening, changing or persistent symptoms should prompt an immediate call or follow up with their primary physician or return to the ED for reevaluation.  The importance of appropriate follow up was also discussed with the patient. The patient states understanding and agreement with plan. More extensive discharge instructions were given in the patient's discharge paperwork.    Amount and/or Complexity of Data Reviewed Labs: ordered. Decision-making details documented in ED Course. Radiology: ordered and independent interpretation performed. Decision-making details documented in ED Course. ECG/medicine tests: ordered and independent interpretation performed. Decision-making details documented in ED Course.  Risk Prescription drug management. Decision regarding hospitalization. Diagnosis or treatment significantly limited by social determinants of health.     Diagnosis:  1. Brain fog   2. Multiple falls   3. Gait instability   4. Visual changes    Condition: Stable Disposition: Discharge   This chart has been completed using [employer] (consulting) software, and while attempts have been made to ensure accuracy, certain words and phrases may not be transcribed as intended.     Moody Mar, DO 10/21/23 0532    Moody Mar, DO 10/21/23 0532

## 2023-11-18 ENCOUNTER — Encounter: Payer: Self-pay | Admitting: *Deleted

## 2023-11-19 ENCOUNTER — Ambulatory Visit: Payer: 59 | Admitting: Diagnostic Neuroimaging

## 2023-11-19 ENCOUNTER — Encounter: Payer: Self-pay | Admitting: Diagnostic Neuroimaging

## 2023-11-19 VITALS — BP 134/88 | HR 86 | Ht 77.0 in | Wt 276.0 lb

## 2023-11-19 DIAGNOSIS — G43719 Chronic migraine without aura, intractable, without status migrainosus: Secondary | ICD-10-CM

## 2023-11-19 DIAGNOSIS — R519 Headache, unspecified: Secondary | ICD-10-CM

## 2023-11-19 DIAGNOSIS — G4719 Other hypersomnia: Secondary | ICD-10-CM

## 2023-11-19 MED ORDER — QULIPTA 60 MG PO TABS
60.0000 mg | ORAL_TABLET | Freq: Every day | ORAL | 6 refills | Status: AC
Start: 2023-11-19 — End: ?

## 2023-11-19 MED ORDER — NURTEC 75 MG PO TBDP: 75.0000 mg | ORAL_TABLET | Freq: Every day | ORAL | 6 refills | Status: DC | PRN

## 2023-11-19 NOTE — Progress Notes (Signed)
[facility]  PATIENT: Malik Hill DOB: 1961-02-22  REFERRING CLINICIAN: Sasser, Clarene Critchley, MD HISTORY FROM: patient  REASON FOR VISIT: new consult   HISTORICAL  CHIEF COMPLAINT:  Chief Complaint  Patient presents with   Headache    Rm 7 alone Pt is well, reports he is having daily headaches currently. Not taking anything for migraines. Associated light headed, imbalance, has had some falls, brain fog.     HISTORY OF PRESENT ILLNESS:   NEW HPI (11/19/23, VRP): 63 year old male here for evaluation of headaches and memory loss.  Patient has had memory loss issues starting in 2023.  Had neuropsychology testing with inconclusive results due to performance validity concerns.  Also long history of anxiety, worsening stress, sleep issues.  Also long history of migraine headaches from teenage years.  Headaches resolved over the years.  However had a concussion in 2022 with recurrence of headaches.  Now having headaches mainly on the right side, frontal and occipital region, sensitive to light and sound, with some nausea.  Has tried [employer] and variety of other medications without relief.     (06/17/23 Previously seen by Dr. Delena Bali:) "Brief HPI: 63 year old male with a history of depression, anxiety, HTN, DM, CVA, and kidney stones who follows in clinic for headaches and memory loss. 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 CTA head/neck did not show any hemodynamically significant stenosis. TTE showed EF of 60-65% with no evidence of shunt.    At his last visit he was started on nurtec for rescue. He was started on donepezil for memory and referred to neuropsychology. Interval History: Headaches have been well-controlled Nurtec as needed. He has not had any migraines recently so he stopped taking Emgality.   Continues to struggle with short term memory and word finding difficulty. Feels like this  is worsening over time. One month ago he got lost while driving in his neighborhood. Was able to find his house because his wife was waving on the front porch. He essentially only drives to the bank and grocery story which are a block from his house. Saw neuropsychology who noted testing was invalid due to poor effort, but was not suggestive of a neurodegenerative disorder. The patient states he did try his best on the test. He has an appointment with neuropsychology for a second opinion in November. He developed diarrhea on donepezil so he stopped it.   He was started on Lipitor but was unable to tolerate it due to muscle aches. Tried Crestor as well but couldn't tolerate this either.   Migraine days per month: 0 Headache free days per month: 30   Current Headache Regimen: Preventative: Emgality Abortive: Nurtec    Prior Therapies                                  Rescue: Tylenol Ubrelvy 100 mg PRN Cannot take NSAIDs due to kidney issues Triptans contrainidcated due to CVA   Prevention: Losartan 25 mg daily Atenolol 25 mg daily Paroxetine 30 mg daily Emgality 120 mg monthly Topamax contraindicated due to kidney stones"    REVIEW OF SYSTEMS: Full 14 system review of systems performed and negative with exception of: as per HPI.  ALLERGIES: Allergies  Allergen Reactions   Percocet [Oxycodone-Acetaminophen] Other (See Comments)    Hallucinations/Dizziness/sweaty   Penicillins Rash    HOME MEDICATIONS: Outpatient  Medications Prior to Visit  Medication Sig Dispense Refill   acetaminophen (TYLENOL) 500 MG tablet Take 1,000 mg by mouth every 6 (six) hours as needed for moderate pain or headache.     ALPRAZolam (XANAX) 1 MG tablet Take 1 mg by mouth 4 (four) times daily.     cetirizine (ZYRTEC) 10 MG tablet Take 10 mg by mouth daily as needed for allergies.     fluticasone (FLONASE) 50 MCG/ACT nasal spray Place 2 sprays into both nostrils daily as needed for allergies or rhinitis.      glipiZIDE (GLUCOTROL XL) 10 MG 24 hr tablet Take 10 mg by mouth 2 (two) times daily.     losartan (COZAAR) 25 MG tablet Take 25 mg by mouth daily.     omeprazole (PRILOSEC OTC) 20 MG tablet Take 20 mg by mouth daily as needed (acid reflux).     ondansetron (ZOFRAN) 4 MG tablet Take 1 tablet (4 mg total) by mouth daily as needed for nausea or vomiting. 30 tablet 1   PARoxetine (PAXIL) 40 MG tablet Take 40 mg by mouth daily.     QUEtiapine (SEROQUEL) 100 MG tablet Take 100 mg by mouth at bedtime.     tamsulosin (FLOMAX) 0.4 MG CAPS capsule Take 1 capsule (0.4 mg total) by mouth daily after supper. 30 capsule 11   traMADol (ULTRAM) 50 MG tablet Take 50 mg by mouth every 8 (eight) hours as needed.     traZODone (DESYREL) 100 MG tablet Take 100 mg by mouth at bedtime.     TRULICITY 1.5 MG/0.5ML SOPN Inject 1.5 mg into the skin every Wednesday.     Vitamin D, Ergocalciferol, (DRISDOL) 1.25 MG (50000 UNIT) CAPS capsule Take 1 capsule (50,000 Units total) by mouth every 7 (seven) days. 12 capsule 0   atenolol (TENORMIN) 25 MG tablet Take 25 mg by mouth 2 (two) times daily. (Patient not taking: Reported on 11/19/2023)     busPIRone (BUSPAR) 5 MG tablet Take 5 mg by mouth 2 (two) times daily.     ezetimibe (ZETIA) 10 MG tablet Take 1 tablet (10 mg total) by mouth daily. 30 tablet 6   hydrochlorothiazide (MICROZIDE) 12.5 MG capsule Take 12.5 mg by mouth daily.  3   HYDROcodone-acetaminophen (NORCO) 5-325 MG tablet Take 1 tablet by mouth every 6 (six) hours as needed for moderate pain. 30 tablet 0   JARDIANCE 25 MG TABS tablet Take 25 mg by mouth daily.     losartan (COZAAR) 25 MG tablet Take 25 mg by mouth daily.  6   memantine (NAMENDA) 5 MG tablet take 5 mg in the morning (1 pill) for one week, then 5 mg twice a day for 1 week, then 10 mg (2 pills) in the morning and 5 mg at bedtime for one week, then 10 mg twice a day 120 tablet 6   pyridOXINE (B-6) 100 MG tablet Take 1 tablet (100 mg total) by mouth  daily. 30 tablet 5   sodium bicarbonate 650 MG tablet Take 1 tablet (650 mg total) by mouth 2 (two) times daily. 60 tablet 11   vitamin B-12 1000 MCG tablet Take 1 tablet (1,000 mcg total) by mouth daily. (Patient taking differently: Take 500 mcg by mouth daily.) 30 tablet 5   No facility-administered medications prior to visit.    PAST MEDICAL HISTORY: Past Medical History:  Diagnosis Date   Anxiety    BPH (benign prostatic hyperplasia)    Cirrhosis (HCC)    Depression  Diabetes mellitus without complication (HCC)    Headache    History of kidney stones    MDD (major depressive disorder)    Memory loss    Osteoarthritis    Stroke (HCC) 09/2021    PAST SURGICAL HISTORY: Past Surgical History:  Procedure Laterality Date   BIOPSY  06/10/2023   Procedure: BIOPSY;  Surgeon: Franky Macho, MD;  Location: [facility];  Service: Endoscopy;;   BUNIONECTOMY     COLONOSCOPY  06/09/2007   COLONOSCOPY WITH PROPOFOL N/A 06/10/2023   Procedure: COLONOSCOPY WITH PROPOFOL;  Surgeon: Franky Macho, MD;  Location: [facility];  Service: Endoscopy;  Laterality: N/A;  9:45am;asa 3   CYSTOSCOPY WITH RETROGRADE PYELOGRAM, URETEROSCOPY AND STENT PLACEMENT Left 01/21/2023   Procedure: CYSTOSCOPY WITH RETROGRADE PYELOGRAM, URETEROSCOPY AND STENT PLACEMENT;  Surgeon: Malen Gauze, MD;  Location: [facility];  Service: Urology;  Laterality: Left;   CYSTOSCOPY WITH RETROGRADE PYELOGRAM, URETEROSCOPY AND STENT PLACEMENT Left 07/01/2023   Procedure: CYSTOSCOPY WITH RETROGRADE PYELOGRAM, URETEROSCOPY AND STENT PLACEMENT;  Surgeon: Malen Gauze, MD;  Location: [facility];  Service: Urology;  Laterality: Left;   ESOPHAGOGASTRODUODENOSCOPY (EGD) WITH PROPOFOL N/A 06/10/2023   Procedure: ESOPHAGOGASTRODUODENOSCOPY (EGD) WITH PROPOFOL;  Surgeon: Franky Macho, MD;  [facility]: [facility];  Service: Endoscopy;  Laterality: N/A;  9:45am;asa 3   HOLMIUM LASER APPLICATION Left 07/01/2023   Procedure:  HOLMIUM LASER APPLICATION;  Surgeon: Malen Gauze, MD;  Location: [facility];  Service: Urology;  Laterality: Left;   KNEE SURGERY Left 2015   PENILE ADHESIONS LYSIS     POLYPECTOMY  06/10/2023   Procedure: POLYPECTOMY;  Surgeon: Franky Macho, MD;  [facility]: [facility];  Service: Endoscopy;;   TONSILLECTOMY AND ADENOIDECTOMY     age 56    FAMILY HISTORY: Family History  Problem Relation Age of Onset   Hypertension Mother    Hypothyroidism Mother    Heart attack Father    Cancer Sister     SOCIAL HISTORY: Social History   Socioeconomic History   Marital status: Married    Spouse name: Not on file   Number of children: Not on file   Years of education: Not on file   Highest education level: Not on file  Occupational History   Not on file  Tobacco Use   Smoking status: Former    Types: Cigarettes   Smokeless tobacco: Never  Vaping Use   Vaping status: Never Used  Substance and Sexual Activity   Alcohol use: Never   Drug use: No   Sexual activity: Not on file  Other Topics Concern   Not on file  Social History Narrative   Not on file   Social Drivers of Health   Financial Resource Strain: Not on file  Food Insecurity: Not on file  Transportation Needs: Not on file  Physical Activity: Not on file  Stress: Not on file  Social Connections: Unknown (01/20/2023)   Received from [facility], [facility]   Social Network    Social Network: Not on file  Intimate Partner Violence: Unknown (01/20/2023)   Received from [employer], [facility]   HITS    Physically Hurt: Not on file    Insult or Talk Down To: Not on file    Threaten Physical Harm: Not on file    Scream or Curse: Not on file    PHYSICAL EXAM  GENERAL EXAM/CONSTITUTIONAL: Vitals:  Vitals:   11/19/23 1035  BP: 134/88  Pulse:  86  Weight: 276 lb (125.2 kg)  Height: 6\' 5"  (1.956 m)   Body mass index is 32.73 kg/m. Wt Readings from Last 3 Encounters:  11/19/23 276 lb (125.2 kg)   07/01/23 267 lb 6.7 oz (121.3 kg)  06/28/23 267 lb 8 oz (121.3 kg)   Patient is in no distress; well developed, nourished and groomed; neck is supple  CARDIOVASCULAR: Examination of carotid arteries is normal; no carotid bruits Regular rate and rhythm, no murmurs Examination of peripheral vascular system by observation and palpation is normal  EYES: Ophthalmoscopic exam of optic discs and posterior segments is normal; no papilledema or hemorrhages No results found.  MUSCULOSKELETAL: Gait, strength, tone, movements noted in Neurologic exam below  NEUROLOGIC: MENTAL STATUS:      No data to display         awake, alert, oriented to person, place and time recent and remote memory intact normal attention and concentration language fluent, comprehension intact, naming intact fund of knowledge appropriate  CRANIAL NERVE:  2nd - no papilledema on fundoscopic exam 2nd, 3rd, 4th, 6th - pupils equal and reactive to light, visual fields full to confrontation, extraocular muscles intact, no nystagmus 5th - facial sensation symmetric 7th - facial strength symmetric 8th - hearing intact 9th - palate elevates symmetrically, uvula midline 11th - shoulder shrug symmetric 12th - tongue protrusion midline  MOTOR:  normal bulk and tone, full strength in the BUE, BLE  SENSORY:  normal and symmetric to light touch, temperature, vibration  COORDINATION:  finger-nose-finger, fine finger movements normal  REFLEXES:  deep tendon reflexes present and symmetric  GAIT/STATION:  narrow based gait     DIAGNOSTIC DATA (LABS, IMAGING, TESTING) - I reviewed patient records, labs, notes, testing and imaging myself where available.  Lab Results  Component Value Date   WBC 4.4 06/08/2023   HGB 15.1 06/08/2023   HCT 46.7 06/08/2023   MCV 87.1 06/08/2023   PLT 128 (L) 06/08/2023      Component Value Date/Time   NA 136 05/18/2023 1152   K 3.9 05/18/2023 1152   CL 102 05/18/2023  1152   CO2 25 05/18/2023 1152   GLUCOSE 241 (H) 05/18/2023 1152   BUN 12 05/18/2023 1152   CREATININE 1.20 05/18/2023 1152   CALCIUM 9.5 05/18/2023 1152   PROT 7.3 05/18/2023 1152   ALBUMIN 4.0 09/25/2021 1452   AST 22 05/18/2023 1152   ALT 22 05/18/2023 1152   ALKPHOS 55 09/25/2021 1452   BILITOT 1.0 05/18/2023 1152   GFRNONAA >60 01/18/2023 1047   GFRAA >60 08/10/2016 1331   No results found for: "CHOL", "HDL", "LDLCALC", "LDLDIRECT", "TRIG", "CHOLHDL" Lab Results  Component Value Date   HGBA1C 7.0 (H) 01/18/2023   Lab Results  Component Value Date   VITAMINB12 222 09/26/2021   Lab Results  Component Value Date   TSH 0.818 09/26/2021    09/25/21 MR THORACIC SPINE IMPRESSION 1. Thoracic multilevel disc degeneration. Multi level disc protrusions. Largest disc protrusion is at T9-10 causing cord flattening and moderate spinal stenosis. 2. Negative for thoracic infection   09/25/21 MR LUMBAR SPINE IMPRESSION 1. Negative for lumbar infection.  Chronic fracture L1 2. Shallow left foraminal disc protrusion L3-4. Central disc protrusion L5-S1 touching the S1 nerve roots but not causing nerve root compression.  04/06/22 MRI brain 1. No evidence of acute intracranial abnormality.  2. Small chronic cortical/subcortical infarct within the medial  right parietal lobe.  3. Prominent perivascular space versus chronic lacunar infarct  within  the right caudate head.  4. Mild generalized cerebral atrophy.  5. Mild bilateral frontal and ethmoid sinus mucosal thickening.  6. Bilateral mastoid effusions.    ASSESSMENT AND PLAN  63 y.o. year old male here with:   Prior Therapies                                  Rescue: Tylenol Ubrelvy 100 mg prn Nurtec 75mg  prn Cannot take NSAIDs due to kidney issues Triptans contrainidcated due to history cerebral infarction   Prevention: Losartan 25 mg daily Atenolol 25 mg daily Paroxetine 30 mg daily Emgality 120 mg monthly Topamax  contraindicated due to kidney stones  Dx:  1. Intractable chronic migraine without aura and without status migrainosus   2. Excessive daytime sleepiness   3. Chronic daily headache     PLAN:  HEADACHES (since Sept 2023 post-concussion; h/o migraine since teenager years; likely combination of chronic daily headaches + migraine with aura)  MIGRAINE TREATMENT PLAN:  MIGRAINE PREVENTION  LIFESTYLE CHANGES -Stop or avoid smoking -Decrease or avoid caffeine / alcohol -Eat and sleep on a regular schedule -Exercise several times per week - start atogepant Bennie Pierini) 60mg  daily   MIGRAINE RESCUE  - ibuprofen, tylenol as needed - start rimegepant (Nurtec) 75mg  as needed for breakthrough headache; max 8 per month   MEMORY LOSS (likely related to underlying anxiety disorder, insomnia, high stress levels, starting in 2022; neuropsychology testing in May 2024 was inconclusive due to performance validity problems) - try to stay active physically and get some exercise (at least 15-30 minutes per day) - eat a nutritious diet with lean protein, plants / vegetables, whole grains; avoid ultra-processed foods - increase social activities, brain stimulation, games, puzzles, hobbies, crafts, arts, music; try new activities; keep it fun! - aim for at least 7-8 hours sleep per night (or more) - avoid smoking and alcohol - caution with medications, finances, driving - safety / supervision issues reviewed  Excessive fatigue - check sleep study (excessive daytime sleepiness) - continue treatments for anxiety, depression, insomnia per PCP - check thyroid, testosterone levels per PCP  Orders Placed This Encounter  Procedures   Ambulatory referral to Sleep Studies   Meds ordered this encounter  Medications   Atogepant (QULIPTA) 60 MG TABS    Sig: Take 1 tablet (60 mg total) by mouth daily.    Dispense:  30 tablet    Refill:  6   Rimegepant Sulfate (NURTEC) 75 MG TBDP    Sig: Take 1 tablet (75  mg total) by mouth daily as needed.    Dispense:  8 tablet    Refill:  6   Return in about 6 months (around 05/18/2024) for with NP.    Suanne Marker, MD 11/19/2023, 11:14 AM Certified in Neurology, Neurophysiology and Neuroimaging  [facility] [address] [place], [place] [address] [phone]

## 2023-11-19 NOTE — Patient Instructions (Signed)
HEADACHES (since Sept 2023 post-concussion; h/o migraine since teenager years; likely combination of chronic daily headaches + migraine with aura)  MIGRAINE TREATMENT PLAN:  MIGRAINE PREVENTION  LIFESTYLE CHANGES -Stop or avoid smoking -Decrease or avoid caffeine / alcohol -Eat and sleep on a regular schedule -Exercise several times per week - start atogepant Bennie Pierini) 60mg  daily  MIGRAINE RESCUE  - ibuprofen, tylenol as needed - start rimegepant (Nurtec) 75mg  as needed for breakthrough headache; max 8 per month  MEMORY LOSS (likely related to underlying anxiety disorder, insomnia, high stress levels, starting in 2022; neuropsychology testing in May 2024 was inconclusive due to performance validity problems) - try to stay active physically and get some exercise (at least 15-30 minutes per day) - eat a nutritious diet with lean protein, plants / vegetables, whole grains; avoid ultra-processed foods - increase social activities, brain stimulation, games, puzzles, hobbies, crafts, arts, music; try new activities; keep it fun! - aim for at least 7-8 hours sleep per night (or more) - avoid smoking and alcohol - caution with medications, finances, driving - safety / supervision issues reviewed  Excessive fatigue - check sleep study (excessive daytime sleepiness) - continue treatments for anxiety, depression, insomnia per PCP - check thyroid, testosterone levels per PCP

## 2023-12-02 ENCOUNTER — Telehealth: Payer: Self-pay | Admitting: Diagnostic Neuroimaging

## 2023-12-02 NOTE — Telephone Encounter (Signed)
 Pt said Atogepant (QULIPTA) 60 MG TABS  is not helping. Having migraines today, taken Quilpta, but have not taken the Nurtec. Would like a call from the nurse

## 2023-12-02 NOTE — Telephone Encounter (Signed)
 Call to patient, he says that he has been taking qulipta daily but had a migraine for 10 days. He has the nurtec and ahas been afraid to take it. I advised to take the Nurtec today, get rest, and stay hydrated and let us know if he doesn't get any relief. Patient was appreciative of call. I also advised that it works best at onset of migraine. He verbalized understanding.

## 2023-12-08 ENCOUNTER — Ambulatory Visit: Admitting: Adult Health

## 2023-12-13 ENCOUNTER — Institutional Professional Consult (permissible substitution): Payer: 59 | Admitting: Neurology

## 2023-12-27 ENCOUNTER — Ambulatory Visit (INDEPENDENT_AMBULATORY_CARE_PROVIDER_SITE_OTHER): Admitting: Neurology

## 2023-12-27 ENCOUNTER — Encounter: Payer: Self-pay | Admitting: Neurology

## 2023-12-27 VITALS — BP 118/82 | HR 112 | Ht 77.0 in | Wt 275.0 lb

## 2023-12-27 DIAGNOSIS — Z9189 Other specified personal risk factors, not elsewhere classified: Secondary | ICD-10-CM

## 2023-12-27 DIAGNOSIS — R351 Nocturia: Secondary | ICD-10-CM

## 2023-12-27 DIAGNOSIS — G4719 Other hypersomnia: Secondary | ICD-10-CM

## 2023-12-27 DIAGNOSIS — Z8673 Personal history of transient ischemic attack (TIA), and cerebral infarction without residual deficits: Secondary | ICD-10-CM

## 2023-12-27 DIAGNOSIS — R519 Headache, unspecified: Secondary | ICD-10-CM

## 2023-12-27 DIAGNOSIS — E66811 Obesity, class 1: Secondary | ICD-10-CM | POA: Diagnosis not present

## 2023-12-27 DIAGNOSIS — G4700 Insomnia, unspecified: Secondary | ICD-10-CM

## 2023-12-27 NOTE — Progress Notes (Signed)
 Subjective:    Patient ID: Malik Hill is a 63 y.o. male.  HPI    Huston Foley, MD, PhD [facility] [address], [address] [address] [place], [place] [address]  Dear Satira Sark,   I saw your patient, Malik Hill, upon your kind request in my clinic today for initial consultation of his sleep disorder, in particular, concern for underlying obstructive sleep apnea.  The patient is unaccompanied today.  As you know, Malik Hill is a 63 year old male with an underlying medical history of osteoarthritis, stroke, memory loss, history of kidney stones, depression, anxiety, liver disease, BPH, hyperlipidemia, migraine headaches, and obesity, who excessive daytime somnolence as well as chronic difficulty initiating and maintaining sleep.  Of note, he is on multiple potentially sedating medications including trazodone at bedtime and Seroquel at bedtime as well as Xanax 1 mg strength 1 pill up to 4 times a day as needed.  Usually takes 1 pill at bedtime.  In addition, he is on multiple other medications including Prozac, Qulipta, Nurtec, and tramadol.  His Epworth sleepiness score is 17 out of 24, fatigue severity score is 57 out of 63.  He is not aware of any family history of sleep apnea.  He has had some weight fluctuation, lost about 30 pounds in the past and then gained about 15 pounds back, more or less stable in the past few months.  He had a tonsillectomy in elementary school.  He drinks caffeine in the form of diet Pepsi, 1 bottle, 16.9 ounce size per day.  He is a non-smoker and drinks no alcohol currently. I reviewed your office note from 11/19/2023.  He previously followed with Dr. Ocie Doyne in this office.  He has a history of right parietal lobe ischemic infarct.  He has nocturia about twice per average night.  He has had recurrent morning headaches.  Bedtime is between 8 and 9 PM and rise time between 8 and 9 AM.  He lives with his wife.  He is retired.  He has 3  grown children and 3 grandchildren, 1 great grandchild.  His Past Medical History Is Significant For: Past Medical History:  Diagnosis Date   Anxiety    BPH (benign prostatic hyperplasia)    Cirrhosis (HCC)    Depression    Diabetes mellitus without complication (HCC)    Headache    History of kidney stones    MDD (major depressive disorder)    Memory loss    Osteoarthritis    Stroke (HCC) 09/2021    His Past Surgical History Is Significant For: Past Surgical History:  Procedure Laterality Date   BIOPSY  06/10/2023   Procedure: BIOPSY;  Surgeon: Franky Macho, MD;  [facility]: [facility];  Service: Endoscopy;;   BUNIONECTOMY     COLONOSCOPY  06/09/2007   COLONOSCOPY WITH PROPOFOL N/A 06/10/2023   Procedure: COLONOSCOPY WITH PROPOFOL;  Surgeon: Franky Macho, MD;  Location: [facility];  Service: Endoscopy;  Laterality: N/A;  9:45am;asa 3   CYSTOSCOPY WITH RETROGRADE PYELOGRAM, URETEROSCOPY AND STENT PLACEMENT Left 01/21/2023   Procedure: CYSTOSCOPY WITH RETROGRADE PYELOGRAM, URETEROSCOPY AND STENT PLACEMENT;  Surgeon: Malen Gauze, MD;  Location: [facility];  Service: Urology;  Laterality: Left;   CYSTOSCOPY WITH RETROGRADE PYELOGRAM, URETEROSCOPY AND STENT PLACEMENT Left 07/01/2023   Procedure: CYSTOSCOPY WITH RETROGRADE PYELOGRAM, URETEROSCOPY AND STENT PLACEMENT;  Surgeon: Malen Gauze, MD;  Location: [facility];  Service: Urology;  Laterality: Left;   ESOPHAGOGASTRODUODENOSCOPY (EGD) WITH PROPOFOL  N/A 06/10/2023   Procedure: ESOPHAGOGASTRODUODENOSCOPY (EGD) WITH PROPOFOL;  Surgeon: Franky Macho, MD;  [facility];  Service: Endoscopy;  Laterality: N/A;  9:45am;asa 3   HOLMIUM LASER APPLICATION Left 07/01/2023   Procedure: HOLMIUM LASER APPLICATION;  Surgeon: Malen Gauze, MD;  Location: [facility];  Service: Urology;  Laterality: Left;   KNEE SURGERY Left 2015   PENILE ADHESIONS LYSIS     POLYPECTOMY  06/10/2023   Procedure: POLYPECTOMY;  Surgeon:  Franky Macho, MD;  [facility]: [facility];  Service: Endoscopy;;   TONSILLECTOMY AND ADENOIDECTOMY     age 63    His Family History Is Significant For: Family History  Problem Relation Age of Onset   Hypertension Mother    Hypothyroidism Mother    Heart attack Father    Cancer Sister     His Social History Is Significant For: Social History   Socioeconomic History   Marital status: Married    Spouse name: Not on file   Number of children: Not on file   Years of education: Not on file   Highest education level: Not on file  Occupational History   Not on file  Tobacco Use   Smoking status: Former    Types: Cigarettes   Smokeless tobacco: Never  Vaping Use   Vaping status: Never Used  Substance and Sexual Activity   Alcohol use: Never   Drug use: No   Sexual activity: Not on file  Other Topics Concern   Not on file  Social History Narrative   Not on file   Social Drivers of Health   Financial Resource Strain: Not on file  Food Insecurity: Not on file  Transportation Needs: Not on file  Physical Activity: Not on file  Stress: Not on file  Social Connections: Unknown (01/20/2023)   Received from [facility]   Social Network    Social Network: Not on file    His Allergies Are:  Allergies  Allergen Reactions   Percocet [Oxycodone-Acetaminophen] Other (See Comments)    Hallucinations/Dizziness/sweaty   Penicillins Rash  :   His Current Medications Are:  Outpatient Encounter Medications as of 12/27/2023  Medication Sig   acetaminophen (TYLENOL) 500 MG tablet Take 1,000 mg by mouth every 6 (six) hours as needed for moderate pain or headache.   ALPRAZolam (XANAX) 1 MG tablet Take 1 mg by mouth 4 (four) times daily.   Atogepant (QULIPTA) 60 MG TABS Take 1 tablet (60 mg total) by mouth daily.   cetirizine (ZYRTEC) 10 MG tablet Take 10 mg by mouth daily as needed for allergies.   fluticasone (FLONASE) 50 MCG/ACT nasal spray Place 2 sprays into  both nostrils daily as needed for allergies or rhinitis.   glipiZIDE (GLUCOTROL XL) 10 MG 24 hr tablet Take 10 mg by mouth 2 (two) times daily.   losartan (COZAAR) 25 MG tablet Take 25 mg by mouth daily.   omeprazole (PRILOSEC OTC) 20 MG tablet Take 20 mg by mouth daily as needed (acid reflux).   ondansetron (ZOFRAN) 4 MG tablet Take 1 tablet (4 mg total) by mouth daily as needed for nausea or vomiting.   PARoxetine (PAXIL) 40 MG tablet Take 40 mg by mouth daily.   QUEtiapine (SEROQUEL) 100 MG tablet Take 100 mg by mouth at bedtime.   Rimegepant Sulfate (NURTEC) 75 MG TBDP Take 1 tablet (75 mg total) by mouth daily as needed.   tamsulosin (FLOMAX) 0.4 MG CAPS capsule Take 1 capsule (  0.4 mg total) by mouth daily after supper.   traMADol (ULTRAM) 50 MG tablet Take 50 mg by mouth every 8 (eight) hours as needed.   traZODone (DESYREL) 100 MG tablet Take 100 mg by mouth at bedtime.   TRULICITY 1.5 MG/0.5ML SOPN Inject 1.5 mg into the skin every Wednesday.   Vitamin D, Ergocalciferol, (DRISDOL) 1.25 MG (50000 UNIT) CAPS capsule Take 1 capsule (50,000 Units total) by mouth every 7 (seven) days.   No facility-administered encounter medications on file as of 12/27/2023.  :   Review of Systems:  Out of a complete 14 point review of systems, all are reviewed and negative with the exception of these symptoms as listed below:   Review of Systems  Neurological:        Pt states that he had a stroke and since his stroke he has been having increased fatigue and migraines. Pt has had a couple of falls, his most recent fall 2 months ago. Pt states that he is on 2 medications for sleep and he feels as though he doesn't get rest.     Objective:  Neurological Exam  Physical Exam Physical Examination:   Vitals:   12/27/23 1403  BP: 118/82  Pulse: (!) 112    General Examination: The patient is a very pleasant 63 y.o. male in no acute distress. He appears well-developed and well-nourished and well  groomed.   HEENT: Normocephalic, atraumatic, pupils are equal, round and reactive to light, extraocular tracking is good without limitation to gaze excursion or nystagmus noted. Hearing is grossly intact. Face is symmetric with normal facial animation. Speech is clear with no dysarthria noted. There is no hypophonia. There is no lip, neck/head, jaw or voice tremor. Neck is supple with full range of passive and active motion. There are no carotid bruits on auscultation. Oropharynx exam reveals: mild mouth dryness, adequate dental hygiene and mild airway crowding, due to small airway entry, status post tonsillectomy, Mallampati class I, slightly wider uvula.  Tongue protrudes centrally and palate elevates symmetrically, neck circumference 18-1/4 inches, no significant overbite noted.   Chest: Clear to auscultation without wheezing, rhonchi or crackles noted.  Heart: S1+S2+0, regular and normal without murmurs, rubs or gallops noted.   Abdomen: Soft, non-tender and non-distended.  Extremities: There is no pitting edema in the distal lower extremities bilaterally.   Skin: Warm and dry without trophic changes noted.   Musculoskeletal: exam reveals no obvious joint deformities.   Neurologically:  Mental status: The patient is awake, alert and oriented in all 4 spheres. His immediate and remote memory, attention, language skills and fund of knowledge are appropriate. There is no evidence of aphasia, agnosia, apraxia or anomia. Speech is clear with normal prosody and enunciation. Thought process is linear. Mood is normal and affect is normal.  Cranial nerves II - XII are as described above under HEENT exam.  Motor exam: Normal bulk, strength and tone is noted. There is no obvious action or resting tremor.  Fine motor skills and coordination: grossly intact.  Cerebellar testing: No dysmetria or intention tremor. There is no truncal or gait ataxia.  Sensory exam: intact to light touch in the upper and  lower extremities.  Gait, station and balance: He stands easily. No veering to one side is noted. No leaning to one side is noted. Posture is age-appropriate and stance is narrow based. Gait shows normal stride length and normal pace. No problems turning are noted.   Assessment and Plan:  In summary, Louis  Luty is a very pleasant 63 y.o.-year old male with an underlying medical history of osteoarthritis, stroke, memory loss, history of kidney stones, depression, anxiety, liver disease, BPH, hyperlipidemia, migraine headaches, and obesity, whose history and physical exam are concerning for sleep disordered breathing, particularly obstructive sleep apnea (OSA). A laboratory attended sleep study is typically considered "gold standard" for evaluation of sleep disordered breathing.   I had a long chat with the patient about my findings and the diagnosis of sleep apnea, particularly OSA, its prognosis and treatment options. We talked about medical/conservative treatments, surgical interventions and non-pharmacological approaches for symptom control. I explained, in particular, the risks and ramifications of untreated moderate to severe OSA, especially with respect to developing cardiovascular disease down the road, including congestive heart failure (CHF), difficult to treat hypertension, cardiac arrhythmias (particularly A-fib), neurovascular complications including TIA, stroke and dementia. Even type 2 diabetes has, in part, been linked to untreated OSA. Symptoms of untreated OSA may include (but may not be limited to) daytime sleepiness, nocturia (i.e. frequent nighttime urination), memory problems, mood irritability and suboptimally controlled or worsening mood disorder such as depression and/or anxiety, lack of energy, lack of motivation, physical discomfort, as well as recurrent headaches, especially morning or nocturnal headaches. We talked about the importance of maintaining a healthy lifestyle and  striving for healthy weight.  I recommended a sleep study at this time. I outlined the differences between a laboratory attended sleep study which is considered more comprehensive and accurate over the option of a home sleep test (HST); the latter may lead to underestimation of sleep disordered breathing in some instances and does not help with diagnosing upper airway resistance syndrome and is not accurate enough to diagnose primary central sleep apnea typically. I outlined possible surgical and non-surgical treatment options of OSA, including the use of a positive airway pressure (PAP) device (i.e. CPAP, AutoPAP/APAP or BiPAP in certain circumstances), a custom-made dental device (aka oral appliance, which would require a referral to a specialist dentist or orthodontist typically, and is generally speaking not considered for patients with full dentures or edentulous state), upper airway surgical options, such as traditional UPPP (which is not considered a first-line treatment) or the Inspire device (hypoglossal nerve stimulator, which would involve a referral for consultation with an ENT surgeon, after careful selection, following inclusion criteria - also not first-line treatment). I explained the PAP treatment option to the patient in detail, as this is generally considered first-line treatment.  The patient indicated that he would be willing to try PAP therapy, if the need arises. I explained the importance of being compliant with PAP treatment, not only for insurance purposes but primarily to improve patient's symptoms symptoms, and for the patient's long term health benefit, including to reduce His cardiovascular risks longer-term.    We will pick up our discussion about the next steps and treatment options after testing.  We will keep him posted as to the test results by phone call and/or MyChart messaging where possible.  We will plan to follow-up in sleep clinic accordingly as well.  I answered all his  questions today and the patient was in agreement.   I encouraged him to call with any interim questions, concerns, problems or updates or email [name] through MyChart.  Generally speaking, sleep test authorizations may take up to 2 weeks, sometimes less, sometimes longer, the patient is encouraged to get in touch with [name] if they do not hear back from the sleep lab staff directly within the next  2 weeks.  Thank you very much for allowing me to participate in the care of this nice patient. If I can be of any further assistance to you please do not hesitate to talk to me.  Sincerely,   Huston Foley, MD, PhD

## 2023-12-27 NOTE — Patient Instructions (Signed)

## 2023-12-28 ENCOUNTER — Telehealth: Payer: Self-pay | Admitting: Neurology

## 2023-12-28 NOTE — Telephone Encounter (Signed)
 [employer] no auth req   Patient is scheduled at [facility] for 01/11/24 at 9 pm.  Mailed packet & sent mychart.

## 2024-01-05 ENCOUNTER — Other Ambulatory Visit: Payer: Self-pay | Admitting: *Deleted

## 2024-01-05 MED ORDER — EZETIMIBE 10 MG PO TABS: 10.0000 mg | ORAL_TABLET | Freq: Every day | ORAL | 0 refills | Status: DC

## 2024-01-11 ENCOUNTER — Ambulatory Visit: Payer: BC Managed Care – PPO | Admitting: Adult Health

## 2024-01-11 ENCOUNTER — Ambulatory Visit: Admitting: Neurology

## 2024-01-11 DIAGNOSIS — R519 Headache, unspecified: Secondary | ICD-10-CM

## 2024-01-11 DIAGNOSIS — Z9189 Other specified personal risk factors, not elsewhere classified: Secondary | ICD-10-CM

## 2024-01-11 DIAGNOSIS — E66811 Obesity, class 1: Secondary | ICD-10-CM

## 2024-01-11 DIAGNOSIS — G4719 Other hypersomnia: Secondary | ICD-10-CM

## 2024-01-11 DIAGNOSIS — R0683 Snoring: Secondary | ICD-10-CM

## 2024-01-11 DIAGNOSIS — Z8673 Personal history of transient ischemic attack (TIA), and cerebral infarction without residual deficits: Secondary | ICD-10-CM

## 2024-01-11 DIAGNOSIS — G4700 Insomnia, unspecified: Secondary | ICD-10-CM

## 2024-01-11 DIAGNOSIS — G4720 Circadian rhythm sleep disorder, unspecified type: Secondary | ICD-10-CM

## 2024-01-11 DIAGNOSIS — R351 Nocturia: Secondary | ICD-10-CM

## 2024-01-19 ENCOUNTER — Ambulatory Visit (HOSPITAL_COMMUNITY)
Admission: RE | Admit: 2024-01-19 | Discharge: 2024-01-19 | Disposition: A | Discharge status: Home / Self Care | Payer: BC Managed Care – PPO | Source: Ambulatory Visit | Attending: Urology | Admitting: Urology

## 2024-01-19 ENCOUNTER — Encounter: Payer: Self-pay | Admitting: Neurology

## 2024-01-19 DIAGNOSIS — N200 Calculus of kidney: Secondary | ICD-10-CM | POA: Diagnosis present

## 2024-01-19 NOTE — Procedures (Signed)
 Physician Interpretation:     [facility] POLYSOMNOGRAPHY  INTERPRETATION REPORT   STUDY DATE:  01/11/2024     PATIENT NAME:  Malik Hill         DATE OF BIRTH:  1961/02/16  PATIENT ID:  [id number]    TYPE OF STUDY:  PSG  READING PHYSICIAN: Huston Foley, MD, PhD   SCORING TECHNICIAN: Domingo Cocking, RPSGT   Referred by: Dr. Joycelyn Schmid  ? History and Indication for Testing: 63 year old male with an underlying medical history of osteoarthritis, stroke, memory loss, history of kidney stones, depression, anxiety, liver disease, BPH, hyperlipidemia, migraine headaches, and obesity, who excessive daytime somnolence as well as chronic difficulty initiating and maintaining sleep. His Epworth sleepiness score is 17 out of 24, fatigue severity score is 57 out of 63.  Height: 77 in Weight: 275 lb (BMI 32) Neck Size: 18 in    MEDICATIONS: Tylenol, Xanax, Qulipta, Zyrtec, Flonase, Glucotrol, Cozaar, Prilosec, Zofran, Paxil, Seroquel, Nurtec, Flomax, Ultram, Desyrel, Trulicity, Vitamin D   TECHNICAL DESCRIPTION: A registered sleep technologist  was in attendance for the duration of the recording.  Data collection, scoring, video monitoring, and reporting were performed in compliance with the AASM Manual for the Scoring of Sleep and Associated Events; (Hypopnea is scored based on the criteria listed in Section VIII D. 1b in the AASM Manual V2.6 using a 4% oxygen desaturation rule or Hypopnea is scored based on the criteria listed in Section VIII D. 1a in the AASM Manual V2.6 using 3% oxygen desaturation and /or arousal rule).   SLEEP CONTINUITY AND SLEEP ARCHITECTURE:  Lights-out was at 21:49: and lights-on at  05:04:, with a total recording time of 7 hours, 14.5 min. Total sleep time ( TST) was 309.5 minutes with a decreased sleep efficiency at 71.2%. There was  0.0% REM sleep.  BODY POSITION:  TST was divided  between the following sleep positions: 86.8% supine;   13.2% lateral;  0% prone. Duration of total sleep and percent of total sleep in their respective position is as follows: supine 268 minutes (87%), non-supine 41 minutes (13%); right 41 minutes (13%), left 00 minutes (0%), and prone 00 minutes (0%).  Total supine REM sleep time was 00 minutes (0% of total REM sleep).  Sleep latency was increased at 84.5 minutes.  Of the total sleep time, the percentage of stage N1 sleep was 8.6%, stage N2 sleep was 91%, which is markedly increased, stage N3 sleep and REM sleep were absent. Wake after sleep onset (WASO) time accounted for 40.5 minutes with minimal to moderate sleep fragmentation noted.   RESPIRATORY MONITORING:   Based on CMS criteria (using a 4% oxygen desaturation rule for scoring hypopneas), there were 4 apneas (3 obstructive; 1 central; 0 mixed), and 5 hypopneas.  Apnea index was 0.8. Hypopnea index was 1.0. The apnea-hypopnea index was 1.7 overall (2.0 supine, 0 non-supine; 0.0 REM, 0.0 supine REM).  There were 0 respiratory effort-related arousals (RERAs).  The RERA index was 0 events/h. Total respiratory disturbance index (RDI) was 1.7 events/h. RDI results showed: supine RDI  2.0 /h; non-supine RDI 0.0 /h; REM RDI 0.0 /h, supine REM RDI 0.0 /h.   Based on AASM criteria (using a 3% oxygen desaturation and /or arousal rule for scoring hypopneas), there were 4 apneas (3 obstructive; 1 central; 0 mixed), and 6 hypopneas. Apnea index was 0.8. Hypopnea index was 1.2. The apnea-hypopnea index was 1.9/hour overall (2.2 supine, 0 non-supine; 0.0 REM, 0.0 supine REM).  There were 0 respiratory effort-related arousals (RERAs).  The RERA index was 0 events/h. Total respiratory disturbance index (RDI) was 1.9 events/h. RDI results showed: supine RDI  2.2 /h; non-supine RDI 0.0 /h; REM RDI 0.0 /h, supine REM RDI 0.0 /h.    OXIMETRY: Oxyhemoglobin Saturation Nadir during sleep was at  88% from a mean of 92%.  Of the Total sleep time (TST)   hypoxemia (=<88%) was  present for  0.3 minutes, or 0.1% of total sleep time.  LIMB MOVEMENTS: There were 0 periodic limb movements of sleep (0.0/hr), of which 0 (0.0/hr) were associated with an arousal.   AROUSAL: There were 29 arousals in total, for an arousal index of 6 arousals/hour.  Of these, 1 were identified as respiratory-related arousals (0 /h), 0 were PLM-related arousals (0 /h), and 51 were non-specific arousals (10 /h).    EEG: Review of the EEG showed no abnormal electrical discharges and symmetrical bihemispheric findings.    EKG: The EKG revealed normal sinus rhythm (NSR). The average heart rate during sleep was 75 bpm.   AUDIO/VIDEO REVIEW: The audio and video review did not show any abnormal or unusual behaviors, movements, phonations or vocalizations. The patient took no restroom breaks. Snoring was noted, intermittently, in the mild range.  POST-STUDY QUESTIONNAIRE: Post study, the patient indicated, that sleep was worse than usual.    IMPRESSION:  1. Primary Snoring 2. Dysfunctions associated with sleep stages or arousal from sleep   RECOMMENDATIONS:  1. This study does not demonstrate any significant obstructive or central sleep disordered breathing with an AHI of less than 5/hour - his AHI was 1.9/h, O2 nadir 88%.  Mild, intermittent snoring was noted.  The study was limited secondary to absence of REM sleep, in fact, the patient achieved only light stage sleep.  This may have led to some degree of underestimation of his sleep disordered breathing.  Weight loss and avoidance of the supine sleep position may aid in reducing his snoring.  2. This study shows some sleep fragmentation and abnormal sleep stage percentages; these are nonspecific findings and per se do not signify an intrinsic sleep disorder or a cause for the patient's sleep-related symptoms. Causes include (but are not limited to) the first night effect of the sleep study, circadian rhythm disturbances, medication effect or an  underlying mood disorder or medical problem.  3. The patient should be cautioned not to drive, work at heights, or operate dangerous or heavy equipment when tired or sleepy. Review and reiteration of good sleep hygiene measures should be pursued with any patient. 4. The patient will be advised to follow up with the referring provider, who will be notified of the test results.   I certify that I have reviewed the entire raw data recording prior to the issuance of this report in accordance with the Standards of Accreditation of the American Academy of Sleep Medicine (AASM).  Huston Foley, MD, PhD Medical Director, [facility]) Diplomat, ABPN (Neurology and Sleep)               Technical Report:   General Information  Name: Greyson, Peavy BMI: 32.61 Physician: Huston Foley, MD  ID: [id number] Height: 77.0 in Technician: Domingo Cocking, RPSGT  Sex: Male Weight: 275.0 lb Record: [id number]  Age: 8 [Feb 25, 1961] Date: 01/11/2024    Medical & Medication History    Mr. Siegel is a 63 year old male with an underlying medical history of osteoarthritis, stroke, memory loss, history of kidney stones,  depression, anxiety, liver disease, BPH, hyperlipidemia, migraine headaches, and obesity, who excessive daytime somnolence as well as chronic difficulty initiating and maintaining sleep. Of note, he is on multiple potentially sedating medications including trazodone at bedtime and Seroquel at bedtime as well as Xanax 1 mg strength 1 pill up to 4 times a day as needed. Usually takes 1 pill at bedtime. In addition, he is on multiple other medications including Prozac, Qulipta, Nurtec, and tramadol. His Epworth sleepiness score is 17 out of 24, fatigue severity score is 57 out of 63. He is not aware of any family history of sleep apnea. He has had some weight fluctuation, lost about 30 pounds in the past and then gained about 15 pounds back, more or less stable in  the past few months. He had a tonsillectomy in elementary school. He drinks caffeine in the form of diet Pepsi, 1 bottle, 16.9 ounce size per day. He is a non-smoker and drinks no alcohol currently. He previously followed with Dr. Ocie Doyne in this office. He has a history of right parietal lobe ischemic infarct.  Tylenol, Xanax, Qulipta, Zyrtec, Flonase, Glucotrol, Cozaar, Prilosec, Zofran, Paxil, Seroquel, Nurtec, Flomax, Ultram, Desyrel, Trulicity, Vitamin D   Sleep Disorder      Comments   Patient arrived for a diagnostic polysomnogram. Procedure explained and all questions answered. paste setup without complications. Patient slept supine and right. Mild snoring was noted. Occasional respiratory events observed. No obvious cardiac arrhythmias noted. Some mild PLMS observed. No nocturia.    Lights out: 09:49:53 PM Lights on: 05:04:20 AM   Time Total Supine Side Prone Upright  Recording (TRT) 7h 14.67m 6h 27.56m 0h 47.75m 0h 0.6m 0h 0.55m  Sleep (TST) 5h 9.27m 4h 28.21m 0h 41.66m 0h 0.69m 0h 0.40m   Latency N1 N2 N3 REM Onset Per. Slp. Eff.  Actual 1h 54.76m 0h 0.2m 0h 0.54m 0h 0.46m 1h 24.10m 1h 24.2m 71.23%   Stg Dur Wake N1 N2 N3 REM  Total 125.0 26.5 283.0 0.0 0.0  Supine 118.5 21.5 247.0 0.0 0.0  Side 6.5 5.0 36.0 0.0 0.0  Prone 0.0 0.0 0.0 0.0 0.0  Upright 0.0 0.0 0.0 0.0 0.0   Stg % Wake N1 N2 N3 REM  Total 28.8 8.6 91.4 0.0 0.0  Supine 27.3 6.9 79.8 0.0 0.0  Side 1.5 1.6 11.6 0.0 0.0  Prone 0.0 0.0 0.0 0.0 0.0  Upright 0.0 0.0 0.0 0.0 0.0     Apnea Summary Sub Supine Side Prone Upright  Total 4 Total 4 4 0 0 0    REM 0 0 0 0 0    NREM 4 4 0 0 0  Obs 3 REM 0 0 0 0 0    NREM 3 3 0 0 0  Mix 0 REM 0 0 0 0 0    NREM 0 0 0 0 0  Cen 1 REM 0 0 0 0 0    NREM 1 1 0 0 0   Rera Summary Sub Supine Side Prone Upright  Total 0 Total 0 0 0 0 0    REM 0 0 0 0 0    NREM 0 0 0 0 0   Hypopnea Summary Sub Supine Side Prone Upright  Total 6 Total 6 6 0 0 0    REM 0 0 0 0 0    NREM 6 6 0 0  0   4% Hypopnea Summary Sub Supine Side Prone Upright  Total (4%) 5 Total 5 5 0  0 0    REM 0 0 0 0 0    NREM 5 5 0 0 0     AHI Total Obs Mix Cen  1.94 Apnea 0.78 0.58 0.00 0.19   Hypopnea 1.16 -- -- --  1.74 Hypopnea (4%) 0.97 -- -- --    Total Supine Side Prone Upright  Position AHI 1.94 2.23 0.00 0.00 0.00  REM AHI 0.00   NREM AHI 1.94   Position RDI 1.94 2.23 0.00 0.00 0.00  REM RDI 0.00   NREM RDI 1.94    4% Hypopnea Total Supine Side Prone Upright  Position AHI (4%) 1.74 2.01 0.00 0.00 0.00  REM AHI (4%) 0.00   NREM AHI (4%) 1.74   Position RDI (4%) 1.74 2.01 0.00 0.00 0.00  REM RDI (4%) 0.00   NREM RDI (4%) 1.74    Desaturation Information Threshold: 2% <100% <90% <80% <70% <60% <50% <40%  Supine 92.0 9.0 0.0 0.0 0.0 0.0 0.0  Side 3.0 0.0 0.0 0.0 0.0 0.0 0.0  Prone 0.0 0.0 0.0 0.0 0.0 0.0 0.0  Upright 0.0 0.0 0.0 0.0 0.0 0.0 0.0  Total 95.0 9.0 0.0 0.0 0.0 0.0 0.0  Index 16.3 1.5 0.0 0.0 0.0 0.0 0.0   Threshold: 3% <100% <90% <80% <70% <60% <50% <40%  Supine 27.0 6.0 0.0 0.0 0.0 0.0 0.0  Side 1.0 0.0 0.0 0.0 0.0 0.0 0.0  Prone 0.0 0.0 0.0 0.0 0.0 0.0 0.0  Upright 0.0 0.0 0.0 0.0 0.0 0.0 0.0  Total 28.0 6.0 0.0 0.0 0.0 0.0 0.0  Index 4.8 1.0 0.0 0.0 0.0 0.0 0.0   Threshold: 4% <100% <90% <80% <70% <60% <50% <40%  Supine 15.0 5.0 0.0 0.0 0.0 0.0 0.0  Side 0.0 0.0 0.0 0.0 0.0 0.0 0.0  Prone 0.0 0.0 0.0 0.0 0.0 0.0 0.0  Upright 0.0 0.0 0.0 0.0 0.0 0.0 0.0  Total 15.0 5.0 0.0 0.0 0.0 0.0 0.0  Index 2.6 0.9 0.0 0.0 0.0 0.0 0.0   Threshold: 3% <100% <90% <80% <70% <60% <50% <40%  Supine 27 6 0 0 0 0 0  Side 1 0 0 0 0 0 0  Prone 0 0 0 0 0 0 0  Upright 0 0 0 0 0 0 0  Total 28 6 0 0 0 0 0   Awakening/Arousal Information # of Awakenings 36  Wake after sleep onset 40.33m  Wake after persistent sleep 40.65m   Arousal Assoc. Arousals Index  Apneas 0 0.0  Hypopneas 1 0.2  Leg Movements 2 0.4  Snore 0 0.0  PTT Arousals 0 0.0  Spontaneous 51 9.9  Total 54  10.5  Leg Movement Information PLMS LMs Index  Total LMs during PLMS 0 0.0  LMs w/ Microarousals 0 0.0   LM LMs Index  w/ Microarousal 2 0.4  w/ Awakening 7 1.4  w/ Resp Event 0 0.0  Spontaneous 19 3.7  Total 21 4.1     Desaturation threshold setting: 3% Minimum desaturation setting: 10 seconds SaO2 nadir: 88% The longest event was a 23 sec obstructive Hypopnea with a minimum SaO2 of 90%. The lowest SaO2 was 89% associated with a 15 sec obstructive Hypopnea. EKG Rates EKG Avg Max Min  Awake 79 113 70  Asleep 75 105 69  EKG Events: Tachycardia

## 2024-02-02 ENCOUNTER — Telehealth: Payer: Self-pay

## 2024-02-02 NOTE — Telephone Encounter (Signed)
 Sherrlyn Dolores from Radiology called to give call report. Results forward to Dr. Claretta Croft for review.

## 2024-02-08 ENCOUNTER — Telehealth: Payer: Self-pay

## 2024-02-08 DIAGNOSIS — R1084 Generalized abdominal pain: Secondary | ICD-10-CM

## 2024-02-08 DIAGNOSIS — N200 Calculus of kidney: Secondary | ICD-10-CM

## 2024-02-08 NOTE — Telephone Encounter (Signed)
-----   Message from Johnie Nailer sent at 02/08/2024  7:19 AM EDT ----- Please order CT stone study ----- Message ----- From: Audra Blend, CMA Sent: 02/02/2024   3:59 PM EDT To: Marco Severs, MD  Please review

## 2024-02-08 NOTE — Telephone Encounter (Signed)
 Tried calling patient to make him aware with no answer, left vm to call back.

## 2024-02-26 ENCOUNTER — Ambulatory Visit (HOSPITAL_COMMUNITY)
Admission: RE | Admit: 2024-02-26 | Discharge: 2024-02-26 | Disposition: A | Discharge status: Home / Self Care | Source: Ambulatory Visit | Attending: Urology

## 2024-02-26 DIAGNOSIS — R1084 Generalized abdominal pain: Secondary | ICD-10-CM | POA: Diagnosis present

## 2024-02-26 DIAGNOSIS — N200 Calculus of kidney: Secondary | ICD-10-CM | POA: Insufficient documentation

## 2024-03-03 ENCOUNTER — Ambulatory Visit: Payer: BC Managed Care – PPO | Admitting: Urology

## 2024-03-03 VITALS — BP 95/62 | HR 92

## 2024-03-03 DIAGNOSIS — N401 Enlarged prostate with lower urinary tract symptoms: Secondary | ICD-10-CM

## 2024-03-03 DIAGNOSIS — R3912 Poor urinary stream: Secondary | ICD-10-CM | POA: Diagnosis not present

## 2024-03-03 DIAGNOSIS — N138 Other obstructive and reflux uropathy: Secondary | ICD-10-CM

## 2024-03-03 DIAGNOSIS — N35011 Post-traumatic bulbous urethral stricture: Secondary | ICD-10-CM | POA: Diagnosis not present

## 2024-03-03 DIAGNOSIS — N200 Calculus of kidney: Secondary | ICD-10-CM

## 2024-03-03 LAB — MICROSCOPIC EXAMINATION: Bacteria, UA: NONE SEEN

## 2024-03-03 LAB — URINALYSIS, ROUTINE W REFLEX MICROSCOPIC
Bilirubin, UA: NEGATIVE
Ketones, UA: NEGATIVE
Leukocytes,UA: NEGATIVE
Nitrite, UA: NEGATIVE
Specific Gravity, UA: 1.025 (ref 1.005–1.030)
Urobilinogen, Ur: 1 mg/dL (ref 0.2–1.0)
pH, UA: 6 (ref 5.0–7.5)

## 2024-03-03 MED ORDER — CIPROFLOXACIN HCL 500 MG PO TABS
500.0000 mg | ORAL_TABLET | Freq: Once | ORAL | Status: AC
  Administered 2024-03-03: 500 mg via ORAL

## 2024-03-03 NOTE — Progress Notes (Signed)
 03/03/2024 10:42 AM   Malik Hill [birth year] [id number]  Referring provider: Orest Bio, MD [address] [place],  [place] [address]  Followup nephrolithiasis    HPI: Malik Hill is a 63yo here for followup fro nephrolithiasis and for evaluation of difficulty urinating. He denies nay stone events since last visit. He denies any flank pain. Ct 5/24 shows 2-60mm left mid pole calculus. Over the past month he has noted his urinary stream is weaker and he occasionally has to strain to urinate. No dysuria or hematuria. IPSS 24 QOL 5 on flomax  0.4mg  daily   PMH: Past Medical History:  Diagnosis Date   Anxiety    BPH (benign prostatic hyperplasia)    Cirrhosis (HCC)    Depression    Diabetes mellitus without complication (HCC)    Headache    History of kidney stones    MDD (major depressive disorder)    Memory loss    Osteoarthritis    Stroke (HCC) 09/2021    Surgical History: Past Surgical History:  Procedure Laterality Date   BIOPSY  06/10/2023   Procedure: BIOPSY;  Surgeon: Hargis Lias, MD;  [facility];  Service: Endoscopy;;   BUNIONECTOMY     COLONOSCOPY  06/09/2007   COLONOSCOPY WITH PROPOFOL  N/A 06/10/2023   Procedure: COLONOSCOPY WITH PROPOFOL ;  Surgeon: Hargis Lias, MD;  Location: [facility];  Service: Endoscopy;  Laterality: N/A;  9:45am;asa 3   CYSTOSCOPY WITH RETROGRADE PYELOGRAM, URETEROSCOPY AND STENT PLACEMENT Left 01/21/2023   Procedure: CYSTOSCOPY WITH RETROGRADE PYELOGRAM, URETEROSCOPY AND STENT PLACEMENT;  Surgeon: Marco Severs, MD;  Location: [facility];  Service: Urology;  Laterality: Left;   CYSTOSCOPY WITH RETROGRADE PYELOGRAM, URETEROSCOPY AND STENT PLACEMENT Left 07/01/2023   Procedure: CYSTOSCOPY WITH RETROGRADE PYELOGRAM, URETEROSCOPY AND STENT PLACEMENT;  Surgeon: Marco Severs, MD;  Location: [facility];  Service: Urology;  Laterality: Left;   ESOPHAGOGASTRODUODENOSCOPY (EGD) WITH PROPOFOL  N/A 06/10/2023    Procedure: ESOPHAGOGASTRODUODENOSCOPY (EGD) WITH PROPOFOL ;  Surgeon: Hargis Lias, MD;  [facility];  Service: Endoscopy;  Laterality: N/A;  9:45am;asa 3   HOLMIUM LASER APPLICATION Left 07/01/2023   Procedure: HOLMIUM LASER APPLICATION;  Surgeon: Marco Severs, MD;  Location: [facility];  Service: Urology;  Laterality: Left;   KNEE SURGERY Left 2015   PENILE ADHESIONS LYSIS     POLYPECTOMY  06/10/2023   Procedure: POLYPECTOMY;  Surgeon: Hargis Lias, MD;  [facility]: [facility];  Service: Endoscopy;;   TONSILLECTOMY AND ADENOIDECTOMY     age 63    Home Medications:  Allergies as of 03/03/2024       Reactions   Percocet [oxycodone -acetaminophen ] Other (See Comments)   Hallucinations/Dizziness/sweaty   Penicillins Rash        Medication List        Accurate as of Mar 03, 2024 10:42 AM. If you have any questions, ask your nurse or doctor.          acetaminophen  500 MG tablet Commonly known as: TYLENOL  Take 1,000 mg by mouth every 6 (six) hours as needed for moderate pain or headache.   ALPRAZolam  1 MG tablet Commonly known as: XANAX  Take 1 mg by mouth 4 (four) times daily.   cetirizine 10 MG tablet Commonly known as: ZYRTEC Take 10 mg by mouth daily as needed for allergies.   ezetimibe  10 MG tablet Commonly known as: ZETIA  Take 1 tablet (10 mg total) by mouth daily.   fluticasone  50 MCG/ACT nasal spray Commonly known as: FLONASE Place 2 sprays into both nostrils daily as needed for allergies or rhinitis.   glipiZIDE 10 MG 24 hr tablet Commonly known as: GLUCOTROL XL Take 10 mg by mouth 2 (two) times daily.   losartan  25 MG tablet Commonly known as: COZAAR  Take 25 mg by mouth daily.   Nurtec 75 MG Tbdp Generic drug: Rimegepant Sulfate Take 1 tablet (75 mg total) by mouth daily as needed.   omeprazole 20 MG tablet Commonly known as: PRILOSEC OTC Take 20 mg by mouth daily as needed (acid reflux).   ondansetron  4 MG tablet Commonly  known as: Zofran  Take 1 tablet (4 mg total) by mouth daily as needed for nausea or vomiting.   PARoxetine  40 MG tablet Commonly known as: PAXIL  Take 40 mg by mouth daily.   QUEtiapine 100 MG tablet Commonly known as: SEROQUEL Take 100 mg by mouth at bedtime.   Qulipta  60 MG Tabs Generic drug: Atogepant  Take 1 tablet (60 mg total) by mouth daily.   tamsulosin  0.4 MG Caps capsule Commonly known as: FLOMAX  Take 1 capsule (0.4 mg total) by mouth daily after supper.   traMADol  50 MG tablet Commonly known as: ULTRAM  Take 50 mg by mouth every 8 (eight) hours as needed.   traZODone  100 MG tablet Commonly known as: DESYREL  Take 100 mg by mouth at bedtime.   Trulicity 1.5 MG/0.5ML Soaj Generic drug: Dulaglutide Inject 1.5 mg into the skin every Wednesday.   Vitamin D  (Ergocalciferol ) 1.25 MG (50000 UNIT) Caps capsule Commonly known as: DRISDOL  Take 1 capsule (50,000 Units total) by mouth every 7 (seven) days.        Allergies:  Allergies  Allergen Reactions   Percocet [Oxycodone -Acetaminophen ] Other (See Comments)    Hallucinations/Dizziness/sweaty   Penicillins Rash    Family History: Family History  Problem Relation Age of Onset   Hypertension Mother    Hypothyroidism Mother    Heart attack Father    Cancer Sister     Social History:  reports that he has quit smoking. His smoking use included cigarettes. He has never used smokeless tobacco. He reports that he does not drink alcohol and does not use drugs.  ROS: All other review of systems were reviewed and are negative except what is noted above in HPI  Physical Exam: BP 95/62   Pulse 92   Constitutional:  Alert and oriented, No acute distress. HEENT: [place] AT, moist mucus membranes.  Trachea midline, no masses. Cardiovascular: No clubbing, cyanosis, or edema. Respiratory: Normal respiratory effort, no increased work of breathing. GI: Abdomen is soft, nontender, nondistended, no abdominal masses GU: No CVA  tenderness.  Lymph: No cervical or inguinal lymphadenopathy. Skin: No rashes, bruises or suspicious lesions. Neurologic: Grossly intact, no focal deficits, moving all 4 extremities. Psychiatric: Normal mood and affect.  Laboratory Data: Lab Results  Component Value Date   WBC 4.4 06/08/2023   HGB 15.1 06/08/2023   HCT 46.7 06/08/2023   MCV 87.1 06/08/2023   PLT 128 (L) 06/08/2023    Lab Results  Component Value Date   CREATININE 1.20 05/18/2023    No results found for: "PSA"  No results found for: "TESTOSTERONE"  Lab Results  Component Value Date   HGBA1C 7.0 (H) 01/18/2023    Urinalysis    Component Value Date/Time   COLORURINE YELLOW 09/25/2021 1411   APPEARANCEUR Clear 09/01/2023 1019   LABSPEC 1.015 09/25/2021 1411   PHURINE 5.5 09/25/2021 1411   GLUCOSEU  3+ (A) 09/01/2023 1019   HGBUR LARGE (A) 09/25/2021 1411   BILIRUBINUR Negative 09/01/2023 1019   KETONESUR 15 (A) 09/25/2021 1411   PROTEINUR Negative 09/01/2023 1019   PROTEINUR TRACE (A) 09/25/2021 1411   UROBILINOGEN 0.2 02/17/2008 1558   NITRITE Negative 09/01/2023 1019   NITRITE NEGATIVE 09/25/2021 1411   LEUKOCYTESUR Negative 09/01/2023 1019   LEUKOCYTESUR NEGATIVE 09/25/2021 1411    Lab Results  Component Value Date   LABMICR Comment 09/01/2023   WBCUA 0-5 07/16/2023   LABEPIT 0-10 07/16/2023   BACTERIA None seen 07/16/2023    Pertinent Imaging: CT stone study 02/26/24: Images reviewed and discussed with the patient No results found for this or any previous visit.  No results found for this or any previous visit.  No results found for this or any previous visit.  No results found for this or any previous visit.  Results for orders placed during the hospital encounter of 01/19/24  Ultrasound renal complete  Narrative CLINICAL DATA:  nephrolithiasis  EXAM: RENAL / URINARY TRACT ULTRASOUND COMPLETE  COMPARISON:  July 21, 2023  FINDINGS: Right Kidney:  Renal measurements:  12.2 x 7.1 x 7.4 cm = volume: 334 mL. Echogenicity within normal limits. No mass or hydronephrosis visualized.  Left Kidney:  Renal measurements: 14.9 x 5.8 x 5.5 cm = volume: 249 mL. Echogenicity within normal limits. No mass visualized. There is mild hydronephrosis with increased pelviectasis, increased since prior ultrasound.  Bladder:  Appears normal for degree of bladder distention.  Other:  None.  IMPRESSION: There is mild left hydronephrosis with increased pelviectasis, overall increased since prior ultrasound.  If there is a persistent clinical concern for obstructing nephrolithiasis, dedicated CT abdomen pelvis would be recommended.  These results will be called to the ordering clinician or representative by the Radiologist Assistant, and communication documented in the PACS or [employer].   Electronically Signed By: Clancy Crimes M.D. On: 02/02/2024 15:09  No results found for this or any previous visit.  Results for orders placed during the hospital encounter of 12/04/22  CT HEMATURIA WORKUP  Narrative CLINICAL DATA:  Gross hematuria * Tracking Code: BO *  EXAM: CT ABDOMEN AND PELVIS WITHOUT AND WITH CONTRAST  TECHNIQUE: Multidetector CT imaging of the abdomen and pelvis was performed following the standard protocol before and following the bolus administration of intravenous contrast.  RADIATION DOSE REDUCTION: This exam was performed according to the departmental dose-optimization program which includes automated exposure control, adjustment of the mA and/or kV according to patient size and/or use of iterative reconstruction technique.  CONTRAST:  OMNIPAQUE  IOHEXOL  350 MG/ML SOLN  COMPARISON:  02/13/2008  FINDINGS: Lower chest: No acute abnormality.  Hepatobiliary: No solid liver abnormality is seen. Coarse, nodular contour of the liver. No gallstones, gallbladder wall thickening, or biliary dilatation.  Pancreas:  Unremarkable. No pancreatic ductal dilatation or surrounding inflammatory changes.  Spleen: Splenomegaly, maximum coronal span 16.5 cm.  Adrenals/Urinary Tract: Adrenal glands are unremarkable. Numerous left renal calculi. Heterogeneous, expansile and faintly calcified appearance of the left renal pelvis filling defect measuring 4.9 x 2.9 x 1.8 cm (series 10, image 64, series 7, image 52). The right kidney is normal, without renal calculi, solid lesion, or hydronephrosis. Mild wall thickening of the urinary bladder.  Stomach/Bowel: Stomach is within normal limits. Diverticulum of the descending duodenum. Appendix appears normal. No evidence of bowel wall thickening, distention, or inflammatory changes. Descending and sigmoid diverticulosis.  Vascular/Lymphatic: No significant vascular findings are present. No enlarged abdominal or  pelvic lymph nodes.  Reproductive: No mass or other significant abnormality.  Other: No abdominal wall hernia or abnormality. No ascites.  Musculoskeletal: No acute or significant osseous findings.  IMPRESSION: 1. Numerous left renal calculi. 2. Heterogeneous, expansile and faintly calcified appearance of the left renal pelvis filling defect measuring 4.9 x 2.9 x 1.8 cm. This may reflect a large, partially calcified staghorn calculus but appearance is somewhat concerning for urothelial malignancy. 3. No evidence of lymphadenopathy or metastatic disease in the abdomen or pelvis. 4. Mild wall thickening of the urinary bladder, likely related to chronic outlet obstruction 5. Cirrhotic morphology of the liver.  Splenomegaly. 6. Descending and sigmoid diverticulosis without evidence of acute diverticulitis.  These results will be called to the ordering clinician or representative by the Radiologist Assistant, and communication documented in the PACS or [employer].   Electronically Signed By: Fredricka Jenny M.D. On: 12/05/2022 17:04  Results  for orders placed in visit on 02/08/24  CT RENAL STONE STUDY  Narrative CLINICAL DATA:  Abdominal pain/flank pain  EXAM: CT ABDOMEN AND PELVIS WITHOUT CONTRAST  TECHNIQUE: Multidetector CT imaging of the abdomen and pelvis was performed following the standard protocol without IV contrast.  RADIATION DOSE REDUCTION: This exam was performed according to the departmental dose-optimization program which includes automated exposure control, adjustment of the mA and/or kV according to patient size and/or use of iterative reconstruction technique.  COMPARISON:  Renal ultrasound performed January 19, 2024. CT of the abdomen and pelvis performed May 03, 2023  FINDINGS: Lower chest: No acute abnormality.  Hepatobiliary: Cirrhotic morphology of the liver. No gallstones, gallbladder wall thickening, or biliary dilatation.  Pancreas: Unremarkable. No pancreatic ductal dilatation or surrounding inflammatory changes.  Spleen: Enlarged spleen, similar.  Adrenals/Urinary Tract: The adrenal glands are within normal limits. There is mild prominence of the left renal collecting system. Prominent left renal pelvis. 4 mm stone in the midpole left kidney. Urinary bladder is partially decompressed.  Stomach/Bowel: No dilated loops of bowel. The appendix is well seen and within normal limits. Scattered colonic diverticula.  Vascular/Lymphatic: Nothing significant.  Reproductive: Prostate is unremarkable.  Other: Nothing significant.  Musculoskeletal: Mild degenerative changes in the lumbar spine.  IMPRESSION: 1. Left-sided nephrolithiasis. 2. Prominent renal pelvis without radiopaque calculi identified within the left renal pelvis or left ureter.   Electronically Signed By: Reagan Camera M.D. On: 02/27/2024 09:25    03/03/24  CC: No chief complaint on file.   HPI:  Blood pressure 95/62, pulse 92. NED. A&Ox3.   No respiratory distress   Abd soft, NT, ND Normal phallus with  bilateral descended testicles  Cystoscopy Procedure Note  Patient identification was confirmed, informed consent was obtained, and patient was prepped using Betadine solution.  Lidocaine  jelly was administered per urethral meatus.     Pre-Procedure: - Inspection reveals a normal caliber ureteral meatus.  Procedure: The flexible cystoscope was introduced without difficulty - Short segment bulbar urethral stricture that was dilated from 4 french to 16 french. - Enlarged prostate  - Normal bladder neck - Bilateral ureteral orifices identified - Bladder mucosa  reveals no ulcers, tumors, or lesions - No bladder stones - No trabeculation   Post-Procedure: - Patient tolerated the procedure well  Assessment & Plan:    1. Nephrolithiasis (Primary) Followup 6 monhts with renal US  - Urinalysis, Routine w reflex microscopic  2. Benign prostatic hyperplasia with urinary obstruction -continue flomax  0.4mg  daily  3. Weak urinary stream -likely related to worsening urethral stricture. Continue flomax   0.4mg  daily  4. Urethral stricture -16 french foley placed today after urethral dilation. Followup 5-7 days for voiding trial  No follow-ups on file.  Johnie Nailer, MD  [facility] [place]

## 2024-03-07 ENCOUNTER — Encounter: Payer: Self-pay | Admitting: Urology

## 2024-03-07 ENCOUNTER — Ambulatory Visit

## 2024-03-07 DIAGNOSIS — N138 Other obstructive and reflux uropathy: Secondary | ICD-10-CM

## 2024-03-07 NOTE — Progress Notes (Signed)
 Per Dr. Claretta Croft came in today for catheter pull only.  [id number], CMA

## 2024-03-07 NOTE — Patient Instructions (Signed)
Urethral Stricture  Urethral stricture is when the tube that drains pee (urine) from the bladder out of the body (urethra) becomes too narrow. The urethra can become narrow because of scar tissue, infection, surgery, or an injury. This can make it difficult to pee (urinate). In females, the urethra opens above the vaginal opening. In males, the urethra opens at the tip of the penis, and the urethra is much longer than it is in females. Because of the length of the male urethra, urethral stricture is much more common in males. What are the causes? In males and females, common causes of urethral stricture include: Urinary tract infection (UTI). Sexually transmitted infection (STI). Using a soft tube in the urethra to drain pee from the bladder (urinary catheter). Urinary tract surgery. In males, common causes of urethral stricture include: A severe injury to the pelvis. Prostate surgery. Injury to the penis. In many cases, the cause of urethral stricture is not known. What increases the risk? You are more likely to develop this condition if you: Are male. Males who have had prostate surgery are at risk of developing this condition. Use a urinary catheter. Have had urinary tract surgery. What are the signs or symptoms? The main symptom of this condition is trouble peeing. This may cause decreased pee flow, dribbling, or spraying of pee. Other symptom of this condition may include: Frequent UTIs. Blood in the pee. Pain when peeing. Swelling of the penis in males. Not being able to pee. How is this diagnosed? This condition may be diagnosed based on: Your medical history and a physical exam. Tests of your pee to check for infection or bleeding. X-rays. Ultrasound. Retrograde urethrogram. With this test, a dye is injected into the urethra and then an X-ray is taken. Urethroscopy. This is when a thin tube with a light and camera on the end (urethroscope) is used to look at the urethra. A  CT scan or MRI. How is this treated? This condition is treated with surgery or other procedures. The type of surgery that you have depends on the severity of your condition. You may have: Urethral dilation. In this procedure, the narrow part of the urethra is stretched open (dilated) with dilating instruments or a small balloon. Urethrotomy. In this procedure, a urethroscope is placed into the urethra, and the narrow part of the urethra is cut open with a surgical blade or laser inserted through the urethroscope. Urethroplasty. In this procedure, an incision is made in the urethra and the narrow part is removed. Then, the urethra is reconstructed. Follow these instructions at home: Take over-the-counter and prescription medicines only as told by your health care provider. If you were prescribed antibiotics, take them as told by your provider. Do not stop using the antibiotic even if you start to feel better. Drink enough fluid to keep your pee pale yellow. Keep all follow-up visits. Your provider will check your healing and adjust your treatment plan as needed. Contact a health care provider if: You have frequent peeing or you are only peeing small amounts often. You feel the need to pee urgently. You have pain or burning when you pee. Your pee smells bad or unusual. Your pee is bloody or cloudy. You have pain in your lower abdomen or back. Your genital area is swollen, bruised, or discolored. This includes: The penis, scrotum, and inner thighs for males. The outer genital organs (vulva) and inner thighs for females. You have a fever. You develop swelling in your legs. Get help  right away if: You cannot pee. You have trouble breathing. These symptoms may be an emergency. Get help right away. Call 911. Do not wait to see if the symptoms will go away. Do not drive yourself to the hospital. This information is not intended to replace advice given to you by your health care provider. Make  sure you discuss any questions you have with your health care provider. Document Revised: 07/16/2022 Document Reviewed: 07/16/2022 Elsevier Patient Education  2024 [employer].

## 2024-03-09 ENCOUNTER — Other Ambulatory Visit: Payer: Self-pay

## 2024-03-09 DIAGNOSIS — N200 Calculus of kidney: Secondary | ICD-10-CM

## 2024-03-09 MED ORDER — TAMSULOSIN HCL 0.4 MG PO CAPS: 0.4000 mg | ORAL_CAPSULE | Freq: Every day | ORAL | 1 refills | Status: DC

## 2024-03-29 ENCOUNTER — Telehealth: Payer: Self-pay

## 2024-03-29 ENCOUNTER — Other Ambulatory Visit (HOSPITAL_COMMUNITY): Payer: Self-pay

## 2024-03-29 NOTE — Telephone Encounter (Signed)
 Pharmacy Patient Advocate Encounter   Received notification from Fax that prior authorization for Qulipta  60MG  tablets is required/requested.   Insurance verification completed.   The patient is insured through [employer] .   Per test claim: PA required; PA submitted to above mentioned insurance via CoverMyMeds Key/confirmation #/EOC [id number] Status is pending

## 2024-03-30 ENCOUNTER — Other Ambulatory Visit (HOSPITAL_COMMUNITY): Payer: Self-pay

## 2024-03-30 NOTE — Telephone Encounter (Signed)
 Pharmacy Patient Advocate Encounter  Received notification from [employer] that Prior Authorization for Qulipta  60MG  tablets has been APPROVED from 03/29/2024 to 10/04/2024. Unable to obtain price due to refill too soon rejection, last fill date 03/08/2024 next available fill date6/27/2025   PA #/Case ID/Reference #: PA Case ID #: [id number]

## 2024-04-25 ENCOUNTER — Other Ambulatory Visit: Payer: Self-pay

## 2024-04-25 DIAGNOSIS — N200 Calculus of kidney: Secondary | ICD-10-CM

## 2024-04-25 MED ORDER — TAMSULOSIN HCL 0.4 MG PO CAPS: 0.4000 mg | ORAL_CAPSULE | Freq: Every day | ORAL | 0 refills | Status: DC

## 2024-05-01 ENCOUNTER — Ambulatory Visit: Admitting: Urology

## 2024-05-01 VITALS — BP 113/76 | HR 91

## 2024-05-01 DIAGNOSIS — N138 Other obstructive and reflux uropathy: Secondary | ICD-10-CM

## 2024-05-01 DIAGNOSIS — N200 Calculus of kidney: Secondary | ICD-10-CM

## 2024-05-01 DIAGNOSIS — N35011 Post-traumatic bulbous urethral stricture: Secondary | ICD-10-CM

## 2024-05-01 DIAGNOSIS — N35919 Unspecified urethral stricture, male, unspecified site: Secondary | ICD-10-CM

## 2024-05-01 DIAGNOSIS — N401 Enlarged prostate with lower urinary tract symptoms: Secondary | ICD-10-CM | POA: Diagnosis not present

## 2024-05-01 MED ORDER — CIPROFLOXACIN HCL 500 MG PO TABS
500.0000 mg | ORAL_TABLET | Freq: Once | ORAL | Status: AC
  Administered 2024-05-01: 500 mg via ORAL

## 2024-05-01 MED ORDER — TAMSULOSIN HCL 0.4 MG PO CAPS: 0.4000 mg | ORAL_CAPSULE | Freq: Every day | ORAL | 0 refills | Status: DC

## 2024-05-01 NOTE — Progress Notes (Signed)
 Bladder Scan completed today.  Patient can void prior to the bladder scan. Bladder scan result: 0   Performed By: [facility]

## 2024-05-01 NOTE — Progress Notes (Unsigned)
 05/01/2024 11:55 AM   Malik Hill [birth year] [id number]  Referring provider: Atilano Deward ORN, MD [address] [place],  [place] [address]  Followup urethral stricture   HPI: Malik Hill is a 63yo here for followup for urethral stricture. He notes over the past month he has had a weaker urinary stream.    PMH: Past Medical History:  Diagnosis Date   Anxiety    BPH (benign prostatic hyperplasia)    Cirrhosis (HCC)    Depression    Diabetes mellitus without complication (HCC)    Headache    History of kidney stones    MDD (major depressive disorder)    Memory loss    Osteoarthritis    Stroke (HCC) 09/2021    Surgical History: Past Surgical History:  Procedure Laterality Date   BIOPSY  06/10/2023   Procedure: BIOPSY;  Surgeon: Cinderella Deatrice FALCON, MD;  [facility]: [facility];  Service: Endoscopy;;   BUNIONECTOMY     COLONOSCOPY  06/09/2007   COLONOSCOPY WITH PROPOFOL  N/A 06/10/2023   Procedure: COLONOSCOPY WITH PROPOFOL ;  Surgeon: Cinderella Deatrice FALCON, MD;  Location: [facility];  Service: Endoscopy;  Laterality: N/A;  9:45am;asa 3   CYSTOSCOPY WITH RETROGRADE PYELOGRAM, URETEROSCOPY AND STENT PLACEMENT Left 01/21/2023   Procedure: CYSTOSCOPY WITH RETROGRADE PYELOGRAM, URETEROSCOPY AND STENT PLACEMENT;  Surgeon: Sherrilee Belvie CROME, MD;  Location: [facility];  Service: Urology;  Laterality: Left;   CYSTOSCOPY WITH RETROGRADE PYELOGRAM, URETEROSCOPY AND STENT PLACEMENT Left 07/01/2023   Procedure: CYSTOSCOPY WITH RETROGRADE PYELOGRAM, URETEROSCOPY AND STENT PLACEMENT;  Surgeon: Sherrilee Belvie CROME, MD;  Location: [facility];  Service: Urology;  Laterality: Left;   ESOPHAGOGASTRODUODENOSCOPY (EGD) WITH PROPOFOL  N/A 06/10/2023   Procedure: ESOPHAGOGASTRODUODENOSCOPY (EGD) WITH PROPOFOL ;  Surgeon: Cinderella Deatrice FALCON, MD;  [facility]: [facility];  Service: Endoscopy;  Laterality: N/A;  9:45am;asa 3   HOLMIUM LASER APPLICATION Left 07/01/2023   Procedure: HOLMIUM LASER APPLICATION;  Surgeon:  Sherrilee Belvie CROME, MD;  Location: [facility];  Service: Urology;  Laterality: Left;   KNEE SURGERY Left 2015   PENILE ADHESIONS LYSIS     POLYPECTOMY  06/10/2023   Procedure: POLYPECTOMY;  Surgeon: Cinderella Deatrice FALCON, MD;  [facility]: [facility];  Service: Endoscopy;;   TONSILLECTOMY AND ADENOIDECTOMY     age 63    Home Medications:  Allergies as of 05/01/2024       Reactions   Percocet [oxycodone -acetaminophen ] Other (See Comments)   Hallucinations/Dizziness/sweaty   Penicillins Rash        Medication List        Accurate as of May 01, 2024 11:55 AM. If you have any questions, ask your nurse or doctor.          acetaminophen  500 MG tablet Commonly known as: TYLENOL  Take 1,000 mg by mouth every 6 (six) hours as needed for moderate pain or headache.   ALPRAZolam  1 MG tablet Commonly known as: XANAX  Take 1 mg by mouth 4 (four) times daily.   cetirizine 10 MG tablet Commonly known as: ZYRTEC Take 10 mg by mouth daily as needed for allergies.   ezetimibe  10 MG tablet Commonly known as: ZETIA  Take 1 tablet (10 mg total) by mouth daily.   fluticasone 50 MCG/ACT nasal spray Commonly known as: FLONASE Place 2 sprays into both nostrils daily as needed for allergies or rhinitis.   glipiZIDE 10 MG 24 hr tablet Commonly known as: GLUCOTROL XL Take 10 mg by mouth 2 (two) times daily.  losartan  25 MG tablet Commonly known as: COZAAR  Take 25 mg by mouth daily.   Nurtec 75 MG Tbdp Generic drug: Rimegepant Sulfate Take 1 tablet (75 mg total) by mouth daily as needed.   omeprazole 20 MG tablet Commonly known as: PRILOSEC OTC Take 20 mg by mouth daily as needed (acid reflux).   ondansetron  4 MG tablet Commonly known as: Zofran  Take 1 tablet (4 mg total) by mouth daily as needed for nausea or vomiting.   PARoxetine  40 MG tablet Commonly known as: PAXIL  Take 40 mg by mouth daily.   QUEtiapine 100 MG tablet Commonly known as: SEROQUEL Take 100 mg by mouth at  bedtime.   Qulipta  60 MG Tabs Generic drug: Atogepant  Take 1 tablet (60 mg total) by mouth daily.   tamsulosin  0.4 MG Caps capsule Commonly known as: FLOMAX  Take 1 capsule (0.4 mg total) by mouth daily after supper.   traMADol  50 MG tablet Commonly known as: ULTRAM  Take 50 mg by mouth every 8 (eight) hours as needed.   traZODone  100 MG tablet Commonly known as: DESYREL  Take 100 mg by mouth at bedtime.   Trulicity 1.5 MG/0.5ML Soaj Generic drug: Dulaglutide Inject 1.5 mg into the skin every Wednesday.   Vitamin D  (Ergocalciferol ) 1.25 MG (50000 UNIT) Caps capsule Commonly known as: DRISDOL  Take 1 capsule (50,000 Units total) by mouth every 7 (seven) days.        Allergies:  Allergies  Allergen Reactions   Percocet [Oxycodone -Acetaminophen ] Other (See Comments)    Hallucinations/Dizziness/sweaty   Penicillins Rash    Family History: Family History  Problem Relation Age of Onset   Hypertension Mother    Hypothyroidism Mother    Heart attack Father    Cancer Sister     Social History:  reports that he has quit smoking. His smoking use included cigarettes. He has never used smokeless tobacco. He reports that he does not drink alcohol and does not use drugs.  ROS: All other review of systems were reviewed and are negative except what is noted above in HPI  Physical Exam: BP 113/76   Pulse 91   Constitutional:  Alert and oriented, No acute distress. HEENT: [place] AT, moist mucus membranes.  Trachea midline, no masses. Cardiovascular: No clubbing, cyanosis, or edema. Respiratory: Normal respiratory effort, no increased work of breathing. GI: Abdomen is soft, nontender, nondistended, no abdominal masses GU: No CVA tenderness.  Lymph: No cervical or inguinal lymphadenopathy. Skin: No rashes, bruises or suspicious lesions. Neurologic: Grossly intact, no focal deficits, moving all 4 extremities. Psychiatric: Normal mood and affect.  Laboratory Data: Lab Results   Component Value Date   WBC 4.4 06/08/2023   HGB 15.1 06/08/2023   HCT 46.7 06/08/2023   MCV 87.1 06/08/2023   PLT 128 (L) 06/08/2023    Lab Results  Component Value Date   CREATININE 1.20 05/18/2023    No results found for: PSA  No results found for: TESTOSTERONE  Lab Results  Component Value Date   HGBA1C 7.0 (H) 01/18/2023    Urinalysis    Component Value Date/Time   COLORURINE YELLOW 09/25/2021 1411   APPEARANCEUR Clear 03/03/2024 1020   LABSPEC 1.015 09/25/2021 1411   PHURINE 5.5 09/25/2021 1411   GLUCOSEU 3+ (A) 03/03/2024 1020   HGBUR LARGE (A) 09/25/2021 1411   BILIRUBINUR Negative 03/03/2024 1020   KETONESUR 15 (A) 09/25/2021 1411   PROTEINUR Trace 03/03/2024 1020   PROTEINUR TRACE (A) 09/25/2021 1411   UROBILINOGEN 0.2 02/17/2008 1558  NITRITE Negative 03/03/2024 1020   NITRITE NEGATIVE 09/25/2021 1411   LEUKOCYTESUR Negative 03/03/2024 1020   LEUKOCYTESUR NEGATIVE 09/25/2021 1411    Lab Results  Component Value Date   LABMICR See below: 03/03/2024   WBCUA 0-5 03/03/2024   LABEPIT 0-10 03/03/2024   MUCUS Present (A) 03/03/2024   BACTERIA None seen 03/03/2024    Pertinent Imaging: *** No results found for this or any previous visit.  No results found for this or any previous visit.  No results found for this or any previous visit.  No results found for this or any previous visit.  Results for orders placed during the hospital encounter of 01/19/24  Ultrasound renal complete  Narrative CLINICAL DATA:  nephrolithiasis  EXAM: RENAL / URINARY TRACT ULTRASOUND COMPLETE  COMPARISON:  July 21, 2023  FINDINGS: Right Kidney:  Renal measurements: 12.2 x 7.1 x 7.4 cm = volume: 334 mL. Echogenicity within normal limits. No mass or hydronephrosis visualized.  Left Kidney:  Renal measurements: 14.9 x 5.8 x 5.5 cm = volume: 249 mL. Echogenicity within normal limits. No mass visualized. There is mild hydronephrosis with increased  pelviectasis, increased since prior ultrasound.  Bladder:  Appears normal for degree of bladder distention.  Other:  None.  IMPRESSION: There is mild left hydronephrosis with increased pelviectasis, overall increased since prior ultrasound.  If there is a persistent clinical concern for obstructing nephrolithiasis, dedicated CT abdomen pelvis would be recommended.  These results will be called to the ordering clinician or representative by the Radiologist Assistant, and communication documented in the PACS or [employer].   Electronically Signed By: Corean Salter M.D. On: 02/02/2024 15:09  No results found for this or any previous visit.  Results for orders placed during the hospital encounter of 12/04/22  CT HEMATURIA WORKUP  Narrative CLINICAL DATA:  Gross hematuria * Tracking Code: BO *  EXAM: CT ABDOMEN AND PELVIS WITHOUT AND WITH CONTRAST  TECHNIQUE: Multidetector CT imaging of the abdomen and pelvis was performed following the standard protocol before and following the bolus administration of intravenous contrast.  RADIATION DOSE REDUCTION: This exam was performed according to the departmental dose-optimization program which includes automated exposure control, adjustment of the mA and/or kV according to patient size and/or use of iterative reconstruction technique.  CONTRAST:  OMNIPAQUE  IOHEXOL  350 MG/ML SOLN  COMPARISON:  02/13/2008  FINDINGS: Lower chest: No acute abnormality.  Hepatobiliary: No solid liver abnormality is seen. Coarse, nodular contour of the liver. No gallstones, gallbladder wall thickening, or biliary dilatation.  Pancreas: Unremarkable. No pancreatic ductal dilatation or surrounding inflammatory changes.  Spleen: Splenomegaly, maximum coronal span 16.5 cm.  Adrenals/Urinary Tract: Adrenal glands are unremarkable. Numerous left renal calculi. Heterogeneous, expansile and faintly calcified appearance of the  left renal pelvis filling defect measuring 4.9 x 2.9 x 1.8 cm (series 10, image 64, series 7, image 52). The right kidney is normal, without renal calculi, solid lesion, or hydronephrosis. Mild wall thickening of the urinary bladder.  Stomach/Bowel: Stomach is within normal limits. Diverticulum of the descending duodenum. Appendix appears normal. No evidence of bowel wall thickening, distention, or inflammatory changes. Descending and sigmoid diverticulosis.  Vascular/Lymphatic: No significant vascular findings are present. No enlarged abdominal or pelvic lymph nodes.  Reproductive: No mass or other significant abnormality.  Other: No abdominal wall hernia or abnormality. No ascites.  Musculoskeletal: No acute or significant osseous findings.  IMPRESSION: 1. Numerous left renal calculi. 2. Heterogeneous, expansile and faintly calcified appearance of the left renal pelvis  filling defect measuring 4.9 x 2.9 x 1.8 cm. This may reflect a large, partially calcified staghorn calculus but appearance is somewhat concerning for urothelial malignancy. 3. No evidence of lymphadenopathy or metastatic disease in the abdomen or pelvis. 4. Mild wall thickening of the urinary bladder, likely related to chronic outlet obstruction 5. Cirrhotic morphology of the liver.  Splenomegaly. 6. Descending and sigmoid diverticulosis without evidence of acute diverticulitis.  These results will be called to the ordering clinician or representative by the Radiologist Assistant, and communication documented in the PACS or [employer].   Electronically Signed By: Marolyn JONETTA Jaksch M.D. On: 12/05/2022 17:04  Results for orders placed in visit on 02/08/24  CT RENAL STONE STUDY  Narrative CLINICAL DATA:  Abdominal pain/flank pain  EXAM: CT ABDOMEN AND PELVIS WITHOUT CONTRAST  TECHNIQUE: Multidetector CT imaging of the abdomen and pelvis was performed following the standard protocol without IV  contrast.  RADIATION DOSE REDUCTION: This exam was performed according to the departmental dose-optimization program which includes automated exposure control, adjustment of the mA and/or kV according to patient size and/or use of iterative reconstruction technique.  COMPARISON:  Renal ultrasound performed January 19, 2024. CT of the abdomen and pelvis performed May 03, 2023  FINDINGS: Lower chest: No acute abnormality.  Hepatobiliary: Cirrhotic morphology of the liver. No gallstones, gallbladder wall thickening, or biliary dilatation.  Pancreas: Unremarkable. No pancreatic ductal dilatation or surrounding inflammatory changes.  Spleen: Enlarged spleen, similar.  Adrenals/Urinary Tract: The adrenal glands are within normal limits. There is mild prominence of the left renal collecting system. Prominent left renal pelvis. 4 mm stone in the midpole left kidney. Urinary bladder is partially decompressed.  Stomach/Bowel: No dilated loops of bowel. The appendix is well seen and within normal limits. Scattered colonic diverticula.  Vascular/Lymphatic: Nothing significant.  Reproductive: Prostate is unremarkable.  Other: Nothing significant.  Musculoskeletal: Mild degenerative changes in the lumbar spine.  IMPRESSION: 1. Left-sided nephrolithiasis. 2. Prominent renal pelvis without radiopaque calculi identified within the left renal pelvis or left ureter.   Electronically Signed By: Maude Naegeli M.D. On: 02/27/2024 09:25   Assessment & Plan:    1. Benign prostatic hyperplasia with urinary obstruction (Primary) *** - Urinalysis, Routine w reflex microscopic - BLADDER SCAN AMB NON-IMAGING   No follow-ups on file.  Belvie Clara, MD  [facility] [place]

## 2024-05-02 LAB — URINALYSIS, ROUTINE W REFLEX MICROSCOPIC
Bilirubin, UA: NEGATIVE
Ketones, UA: NEGATIVE
Leukocytes,UA: NEGATIVE
Nitrite, UA: NEGATIVE
Protein,UA: NEGATIVE
RBC, UA: NEGATIVE
Specific Gravity, UA: 1.015 (ref 1.005–1.030)
Urobilinogen, Ur: 2 mg/dL — ABNORMAL HIGH (ref 0.2–1.0)
pH, UA: 6 (ref 5.0–7.5)

## 2024-05-04 ENCOUNTER — Encounter: Payer: Self-pay | Admitting: Urology

## 2024-05-04 NOTE — Patient Instructions (Signed)

## 2024-05-17 ENCOUNTER — Encounter (INDEPENDENT_AMBULATORY_CARE_PROVIDER_SITE_OTHER): Payer: Self-pay | Admitting: *Deleted

## 2024-05-22 ENCOUNTER — Ambulatory Visit: Payer: BC Managed Care – PPO | Admitting: Adult Health

## 2024-06-06 DIAGNOSIS — M545 Low back pain, unspecified: Secondary | ICD-10-CM | POA: Diagnosis not present

## 2024-06-06 DIAGNOSIS — Z6831 Body mass index (BMI) 31.0-31.9, adult: Secondary | ICD-10-CM | POA: Diagnosis not present

## 2024-06-21 ENCOUNTER — Ambulatory Visit: Admitting: Urology

## 2024-06-23 DIAGNOSIS — Z23 Encounter for immunization: Secondary | ICD-10-CM | POA: Diagnosis not present

## 2024-06-28 NOTE — Patient Instructions (Signed)
 Malik Hill  06/28/2024     @PREFPERIOPPHARMACY @   Your procedure is scheduled on 07/03/2024.   Report to [facility] at  1230 P.M.   Call this number if you have problems the morning of surgery:  [phone]  If you experience any cold or flu symptoms such as cough, fever, chills, shortness of breath, etc. between now and your scheduled surgery, please notify us  at the above number.   Remember:         DO NOT take any medications for diabetes the morning of your procedure.   Do not eat after midnight.   You may drink clear liquids until 1030 am on 07/03/2024.      Clear liquids allowed are:                    Water , Juice (No red color; non-citric and without pulp; diabetics please choose diet or no sugar options), Carbonated beverages (diabetics please choose diet or no sugar options), Clear Tea (No creamer, milk, or cream, including half & half and powdered creamer), Black Coffee Only (No creamer, milk or cream, including half & half and powdered creamer), and Clear Sports drink (No red color; diabetics please choose diet or no sugar options)    Take these medicines the morning of surgery with A SIP OF WATER            alprazolam (if needed), atenolol , atogepant (if needed), omeprazole, paxil .    Do not wear jewelry, make-up or nail polish, including gel polish,  artificial nails, or any other type of covering on natural nails (fingers and  toes).  Do not wear lotions, powders, or perfumes, or deodorant.  Do not shave 48 hours prior to surgery.  Men may shave face and neck.  Do not bring valuables to the hospital.  [facility] is not responsible for any belongings or valuables.  Contacts, dentures or bridgework may not be worn into surgery.  Leave your suitcase in the car.  After surgery it may be brought to your room.  For patients admitted to the hospital, discharge time will be determined by your treatment team.  Patients discharged the day of surgery will  not be allowed to drive home and must have someone with them for 24 hours.    Special instructions:  DO NOT smoke tobacco or vape for 24 hours before your procedure.  Please read over the following fact sheets that you were given. Blood Transfusion Information, Surgical Site Infection Prevention, Anesthesia Post-op Instructions, and Care and Recovery After Surgery      Urethral Dilation  Urethral dilation is a procedure to stretch open (dilate) the urethra. The urethra is the tube that drains pee (urine) from the bladder out of the body. In females, the urethra opens above the vaginal opening. In males, the urethra opens at the tip of the penis. Urethral dilation is usually done to treat narrowing of the urethra (urethral stricture), which can make it difficult to pee (urinate). Urethral strictures can be caused by scar tissue, infection, injury, or surgery. Urethral dilation widens the urethra so that you can pee normally. Urethral dilation is done through the opening of the urethra. There are no incisions made during the procedure. Tell a health care provider about: Any allergies you have. All medicines you are taking, including vitamins, herbs, eye drops, creams, and over-the-counter medicines. Any problems you or family members have had with anesthesia. Any bleeding problems you have. Any surgeries  you have had. Any medical conditions you have. Whether you are pregnant or may be pregnant. What are the risks? Your health care provider will talk with you about risks. These may include: Bleeding. Infection. A return of urethral stricture, which requires repeating the dilation procedure or more surgery. Damage to the urethra, which may require reconstructive surgery. Allergic reactions to medicines. What happens before the procedure? Medicines Ask your provider about: Changing or stopping your regular medicines. These include any diabetes medicines or blood thinners you take. Taking  medicines such as aspirin and ibuprofen. These medicines can thin your blood. Do not take them unless your provider tells you to. Taking over-the-counter medicines, vitamins, herbs, and supplements. General instructions Follow instructions from your provider about what you may eat and drink. If you will be going home right after the procedure, plan to have a responsible adult: Take you home from the hospital or clinic. You will not be allowed to drive. Care for you for the time you are told. Ask your provider: How your surgery site will be marked. What steps will be taken to help prevent infection. These steps may include: Removing hair at the surgery site. Washing skin with a soap that kills germs. Taking antibiotics. What happens during the procedure? An IV may be inserted into one of your veins. You may be given: A local anesthetic to numb your urethral opening. This will be applied as a gel that will also lubricate the opening of the urethra. A sedative. This helps you relax. Anesthesia. This keeps you from feeling pain. It will make you fall asleep for surgery. A thin tube with a light and camera on the end (cystoscope) will be inserted into your urethra. Your urethra will be rinsed (irrigated) with a germ-free (sterile) water  solution. Narrow parts of your urethra will be stretched open using a dilator tool. Your surgeon will start with a very thin dilator, then use wider dilators as needed. A thin tube with an inflatable balloon on the tip may be inserted into your urethra. The balloon may be inflated to help stretch your urethra open. The balloon may be coated with a medicine to help the urethra stay open longer. Your urethra will be irrigated. A catheter will be inserted into your bladder at the end of the procedure. The procedure may vary among providers and hospitals. What happens after the procedure? After the procedure, it is common to have: Burning pain when peeing. Blood  in your pee. A need to pee frequently. You will be asked to pee before you leave the hospital or clinic. Your pee flow should improve within a few days. You may have a catheter in your bladder for 2-3 days following your procedure. Follow these instructions at home: Medicines Take over-the-counter and prescription medicines only as told by your provider. If you were prescribed antibiotics, take them as told by your provider. Do not stop using the antibiotic even if you start to feel better. Ask your provider if the medicine prescribed to you: Requires you to avoid driving or using machinery. Can cause constipation. You may need to take these actions to prevent or treat constipation: Take over-the-counter or prescription medicines. Eat foods that are high in fiber, such as beans, whole grains, and fresh fruits and vegetables. Limit foods that are high in fat and processed sugars, such as fried or sweet foods. General instructions If you were given a sedative during the procedure, it can affect you for several hours. Do not drive or  operate machinery until your provider says that it is safe. If you were sent home with a soft tube (catheter) to help keep your urethra open, follow your provider's instructions about how and when to use it. Drink enough fluid to keep your pee pale yellow. Return to your normal activities as told by your provider. Ask your provider what activities are safe for you. Keep all follow-up visits. Your provider will check your healing and adjust your treatment plan as needed. Contact a health care provider if: Your pee is cloudy and smells bad. You develop new bleeding when you pee. You pass blood clots when you pee. You have pain that does not get better with medicine. You have a fever. Your genital area is swollen, bruised, or discolored. This includes: The penis, scrotum, and inner thighs for males. The outer genital organs (vulva) and inner thighs for  females. Get help right away if: You develop new bleeding that does not stop. You cannot pee. Your catheter stops draining pee. You cannot pee after your catheter is removed. These symptoms may be an emergency. Get help right away. Call 911. Do not wait to see if the symptoms will go away. Do not drive yourself to the hospital. This information is not intended to replace advice given to you by your health care provider. Make sure you discuss any questions you have with your health care provider. Document Revised: 07/16/2022 Document Reviewed: 07/16/2022 Elsevier Patient Education  2024 [employer].General Anesthesia, Adult, Care After The following information offers guidance on how to care for yourself after your procedure. Your health care provider may also give you more specific instructions. If you have problems or questions, contact your health care provider. What can I expect after the procedure? After the procedure, it is common for people to: Have pain or discomfort at the IV site. Have nausea or vomiting. Have a sore throat or hoarseness. Have trouble concentrating. Feel cold or chills. Feel weak, sleepy, or tired (fatigue). Have soreness and body aches. These can affect parts of the body that were not involved in surgery. Follow these instructions at home: For the time period you were told by your health care provider:  Rest. Do not participate in activities where you could fall or become injured. Do not drive or use machinery. Do not drink alcohol. Do not take sleeping pills or medicines that cause drowsiness. Do not make important decisions or sign legal documents. Do not take care of children on your own. General instructions Drink enough fluid to keep your urine pale yellow. If you have sleep apnea, surgery and certain medicines can increase your risk for breathing problems. Follow instructions from your health care provider about wearing your sleep device: Anytime  you are sleeping, including during daytime naps. While taking prescription pain medicines, sleeping medicines, or medicines that make you drowsy. Return to your normal activities as told by your health care provider. Ask your health care provider what activities are safe for you. Take over-the-counter and prescription medicines only as told by your health care provider. Do not use any products that contain nicotine or tobacco. These products include cigarettes, chewing tobacco, and vaping devices, such as e-cigarettes. These can delay incision healing after surgery. If you need help quitting, ask your health care provider. Contact a health care provider if: You have nausea or vomiting that does not get better with medicine. You vomit every time you eat or drink. You have pain that does not get better with medicine. You  cannot urinate or have bloody urine. You develop a skin rash. You have a fever. Get help right away if: You have trouble breathing. You have chest pain. You vomit blood. These symptoms may be an emergency. Get help right away. Call 911. Do not wait to see if the symptoms will go away. Do not drive yourself to the hospital. Summary After the procedure, it is common to have a sore throat, hoarseness, nausea, vomiting, or to feel weak, sleepy, or fatigue. For the time period you were told by your health care provider, do not drive or use machinery. Get help right away if you have difficulty breathing, have chest pain, or vomit blood. These symptoms may be an emergency. This information is not intended to replace advice given to you by your health care provider. Make sure you discuss any questions you have with your health care provider. Document Revised: 12/19/2021 Document Reviewed: 12/19/2021 Elsevier Patient Education  2024 Else[employer] Inc.How to Use Chlorhexidine  at Home in the Shower Chlorhexidine  gluconate (CHG) is a germ-killing (antiseptic) wash that's used to clean the  skin. It can get rid of the germs that normally live on the skin and can keep them away for about 24 hours. If you're having surgery, you may be told to shower with CHG at home the night before surgery. This can help lower your risk for infection. To use CHG wash in the shower, follow the steps below. Supplies needed: CHG body wash. Clean washcloth. Clean towel. How to use CHG in the shower Follow these steps unless you're told to use CHG in a different way: Start the shower. Use your normal soap and shampoo to wash your face and hair. Turn off the shower or move out of the shower stream. Pour CHG onto a clean washcloth. Do not use any type of brush or rough sponge. Start at your neck, washing your body down to your toes. Make sure you: Wash the part of your body where the surgery will be done for at least 1 minute. Do not scrub. Do not use CHG on your head or face unless your health care provider tells you to. If it gets into your ears or eyes, rinse them well with water . Do not wash your genitals with CHG. Wash your back and under your arms. Make sure to wash skin folds. Let the CHG sit on your skin for 1-2 minutes or as long as told. Rinse your entire body in the shower, including all body creases and folds. Turn off the shower. Dry off with a clean towel. Do not put anything on your skin afterward, such as powder, lotion, or perfume. Put on clean clothes or pajamas. If it's the night before surgery, sleep in clean sheets. General tips Use CHG only as told, and follow the instructions on the label. Use the full amount of CHG as told. This is often one bottle. Do not smoke and stay away from flames after using CHG. Your skin may feel sticky after using CHG. This is normal. The sticky feeling will go away as the CHG dries. Do not use CHG: If you have a chlorhexidine  allergy or have reacted to chlorhexidine  in the past. On open wounds or areas of skin that have broken skin, cuts, or  scrapes. On babies younger than 57 months of age. Contact a health care provider if: You have questions about using CHG. Your skin gets irritated or itchy. You have a rash after using CHG. You swallow any CHG. Call your  local poison control center ([phone] in the [place] Your eyes itch badly, or they become very red or swollen. Your hearing changes. You have trouble seeing. If you can't reach your provider, go to an urgent care or emergency room. Do not drive yourself. Get help right away if: You have swelling or tingling in your mouth or throat. You make high-pitched whistling sounds when you breathe, most often when you breathe out (wheeze). You have trouble breathing. These symptoms may be an emergency. Call 911 right away. Do not wait to see if the symptoms will go away. Do not drive yourself to the hospital. This information is not intended to replace advice given to you by your health care provider. Make sure you discuss any questions you have with your health care provider. Document Revised: 04/06/2023 Document Reviewed: 04/02/2022 Elsevier Patient Education  2024 [employer].

## 2024-06-29 ENCOUNTER — Encounter (HOSPITAL_COMMUNITY)
Admission: RE | Admit: 2024-06-29 | Discharge: 2024-06-29 | Disposition: A | Discharge status: Home / Self Care | Source: Ambulatory Visit | Attending: Urology | Admitting: Urology

## 2024-06-29 VITALS — BP 126/89 | HR 87 | Resp 18 | Ht 77.0 in | Wt 274.9 lb

## 2024-06-29 DIAGNOSIS — R9431 Abnormal electrocardiogram [ECG] [EKG]: Secondary | ICD-10-CM | POA: Insufficient documentation

## 2024-06-29 DIAGNOSIS — E139 Other specified diabetes mellitus without complications: Secondary | ICD-10-CM | POA: Diagnosis not present

## 2024-06-29 DIAGNOSIS — I10 Essential (primary) hypertension: Secondary | ICD-10-CM | POA: Insufficient documentation

## 2024-06-29 DIAGNOSIS — Z6832 Body mass index (BMI) 32.0-32.9, adult: Secondary | ICD-10-CM

## 2024-06-29 DIAGNOSIS — Z01818 Encounter for other preprocedural examination: Secondary | ICD-10-CM | POA: Insufficient documentation

## 2024-06-29 LAB — HEMOGLOBIN A1C
Hgb A1c MFr Bld: 7.7 % — ABNORMAL HIGH (ref 4.8–5.6)
Mean Plasma Glucose: 174.29 mg/dL

## 2024-06-29 LAB — BASIC METABOLIC PANEL WITH GFR
Anion gap: 10 (ref 5–15)
BUN: 10 mg/dL (ref 8–23)
CO2: 22 mmol/L (ref 22–32)
Calcium: 8.4 mg/dL — ABNORMAL LOW (ref 8.9–10.3)
Chloride: 104 mmol/L (ref 98–111)
Creatinine, Ser: 1.05 mg/dL (ref 0.61–1.24)
GFR, Estimated: >60 mL/min (ref >60)
Glucose, Bld: 292 mg/dL — ABNORMAL HIGH (ref 70–99)
Potassium: 3.9 mmol/L (ref 3.5–5.1)
Sodium: 136 mmol/L (ref 135–145)

## 2024-07-03 ENCOUNTER — Ambulatory Visit (HOSPITAL_COMMUNITY): Admitting: Anesthesiology

## 2024-07-03 ENCOUNTER — Encounter (HOSPITAL_COMMUNITY)
Admission: RE | Disposition: A | Discharge status: Home / Self Care | Payer: Self-pay | Source: Home / Self Care | Attending: Urology

## 2024-07-03 ENCOUNTER — Encounter (HOSPITAL_COMMUNITY): Payer: Self-pay | Admitting: Urology

## 2024-07-03 ENCOUNTER — Ambulatory Visit (HOSPITAL_COMMUNITY)
Admission: RE | Admit: 2024-07-03 | Discharge: 2024-07-03 | Disposition: A | Discharge status: Home / Self Care | Attending: Urology | Admitting: Urology

## 2024-07-03 ENCOUNTER — Other Ambulatory Visit: Payer: Self-pay

## 2024-07-03 DIAGNOSIS — F32A Depression, unspecified: Secondary | ICD-10-CM | POA: Insufficient documentation

## 2024-07-03 DIAGNOSIS — E119 Type 2 diabetes mellitus without complications: Secondary | ICD-10-CM | POA: Diagnosis not present

## 2024-07-03 DIAGNOSIS — N138 Other obstructive and reflux uropathy: Secondary | ICD-10-CM | POA: Insufficient documentation

## 2024-07-03 DIAGNOSIS — N35011 Post-traumatic bulbous urethral stricture: Secondary | ICD-10-CM | POA: Diagnosis present

## 2024-07-03 DIAGNOSIS — R351 Nocturia: Secondary | ICD-10-CM | POA: Diagnosis not present

## 2024-07-03 DIAGNOSIS — Z87891 Personal history of nicotine dependence: Secondary | ICD-10-CM | POA: Diagnosis not present

## 2024-07-03 DIAGNOSIS — Z7985 Long-term (current) use of injectable non-insulin antidiabetic drugs: Secondary | ICD-10-CM | POA: Insufficient documentation

## 2024-07-03 DIAGNOSIS — I10 Essential (primary) hypertension: Secondary | ICD-10-CM | POA: Insufficient documentation

## 2024-07-03 DIAGNOSIS — Z8673 Personal history of transient ischemic attack (TIA), and cerebral infarction without residual deficits: Secondary | ICD-10-CM | POA: Insufficient documentation

## 2024-07-03 DIAGNOSIS — N35912 Unspecified bulbous urethral stricture, male: Secondary | ICD-10-CM

## 2024-07-03 DIAGNOSIS — K7460 Unspecified cirrhosis of liver: Secondary | ICD-10-CM | POA: Insufficient documentation

## 2024-07-03 DIAGNOSIS — R3912 Poor urinary stream: Secondary | ICD-10-CM | POA: Insufficient documentation

## 2024-07-03 DIAGNOSIS — K766 Portal hypertension: Secondary | ICD-10-CM | POA: Diagnosis not present

## 2024-07-03 DIAGNOSIS — Z7984 Long term (current) use of oral hypoglycemic drugs: Secondary | ICD-10-CM | POA: Diagnosis not present

## 2024-07-03 DIAGNOSIS — F419 Anxiety disorder, unspecified: Secondary | ICD-10-CM | POA: Insufficient documentation

## 2024-07-03 DIAGNOSIS — R3916 Straining to void: Secondary | ICD-10-CM | POA: Insufficient documentation

## 2024-07-03 DIAGNOSIS — Z79899 Other long term (current) drug therapy: Secondary | ICD-10-CM | POA: Diagnosis not present

## 2024-07-03 DIAGNOSIS — N401 Enlarged prostate with lower urinary tract symptoms: Secondary | ICD-10-CM | POA: Insufficient documentation

## 2024-07-03 DIAGNOSIS — N35812 Other urethral bulbous stricture, male: Secondary | ICD-10-CM | POA: Diagnosis not present

## 2024-07-03 HISTORY — PX: CYSTOSCOPY WITH URETHRAL DILATATION: SHX5125

## 2024-07-03 LAB — GLUCOSE, CAPILLARY: Glucose-Capillary: 116 mg/dL — ABNORMAL HIGH (ref 70–99)

## 2024-07-03 SURGERY — CYSTOSCOPY, WITH URETHRAL DILATION
Anesthesia: General | Site: Urethra

## 2024-07-03 MED ORDER — PROPOFOL 10 MG/ML IV BOLUS
INTRAVENOUS | Status: DC | PRN
  Administered 2024-07-03: 30 mg via INTRAVENOUS
  Administered 2024-07-03: 240 mg via INTRAVENOUS

## 2024-07-03 MED ORDER — OXYCODONE-ACETAMINOPHEN 5-325 MG PO TABS: 1.0000 | ORAL_TABLET | Freq: Four times a day (QID) | ORAL | 0 refills | Status: AC | PRN

## 2024-07-03 MED ORDER — WATER FOR IRRIGATION, STERILE IR SOLN
Status: DC | PRN
  Administered 2024-07-03: 3000 mL

## 2024-07-03 MED ORDER — ONDANSETRON HCL 4 MG/2ML IJ SOLN
INTRAMUSCULAR | Status: AC
  Filled 2024-07-03: qty 2

## 2024-07-03 MED ORDER — DEXAMETHASONE SODIUM PHOSPHATE 10 MG/ML IJ SOLN
INTRAMUSCULAR | Status: AC
  Filled 2024-07-03: qty 1

## 2024-07-03 MED ORDER — DEXAMETHASONE SODIUM PHOSPHATE 10 MG/ML IJ SOLN
INTRAMUSCULAR | Status: DC | PRN
  Administered 2024-07-03: 5 mg via INTRAVENOUS

## 2024-07-03 MED ORDER — LACTATED RINGERS IV SOLN: INTRAVENOUS | Status: DC

## 2024-07-03 MED ORDER — FENTANYL CITRATE PF 50 MCG/ML IJ SOSY: 25.0000 ug | PREFILLED_SYRINGE | INTRAMUSCULAR | Status: DC | PRN

## 2024-07-03 MED ORDER — LIDOCAINE 2% (20 MG/ML) 5 ML SYRINGE
INTRAMUSCULAR | Status: DC | PRN
  Administered 2024-07-03: 80 mg via INTRAVENOUS

## 2024-07-03 MED ORDER — OXYCODONE HCL 5 MG PO TABS: 5.0000 mg | ORAL_TABLET | Freq: Once | ORAL | Status: DC | PRN

## 2024-07-03 MED ORDER — ORAL CARE MOUTH RINSE: 15.0000 mL | Freq: Once | OROMUCOSAL | Status: AC

## 2024-07-03 MED ORDER — MIDAZOLAM HCL 2 MG/2ML IJ SOLN
INTRAMUSCULAR | Status: AC
  Filled 2024-07-03: qty 2

## 2024-07-03 MED ORDER — ONDANSETRON HCL 4 MG/2ML IJ SOLN
INTRAMUSCULAR | Status: DC | PRN
  Administered 2024-07-03: 4 mg via INTRAVENOUS

## 2024-07-03 MED ORDER — FENTANYL CITRATE (PF) 100 MCG/2ML IJ SOLN
INTRAMUSCULAR | Status: AC
  Filled 2024-07-03: qty 2

## 2024-07-03 MED ORDER — ALBUMIN HUMAN 5 % IV SOLN
INTRAVENOUS | Status: AC
  Filled 2024-07-03: qty 250

## 2024-07-03 MED ORDER — FENTANYL CITRATE (PF) 100 MCG/2ML IJ SOLN
INTRAMUSCULAR | Status: DC | PRN
  Administered 2024-07-03: 75 ug via INTRAVENOUS
  Administered 2024-07-03: 25 ug via INTRAVENOUS

## 2024-07-03 MED ORDER — OXYCODONE HCL 5 MG/5ML PO SOLN: 5.0000 mg | Freq: Once | ORAL | Status: DC | PRN

## 2024-07-03 MED ORDER — MIDAZOLAM HCL 2 MG/2ML IJ SOLN
INTRAMUSCULAR | Status: DC | PRN
  Administered 2024-07-03: 2 mg via INTRAVENOUS

## 2024-07-03 MED ORDER — LIDOCAINE 2% (20 MG/ML) 5 ML SYRINGE
INTRAMUSCULAR | Status: AC
  Filled 2024-07-03: qty 5

## 2024-07-03 MED ORDER — CEFAZOLIN SODIUM-DEXTROSE 3-4 GM/150ML-% IV SOLN
3.0000 g | INTRAVENOUS | Status: AC
  Administered 2024-07-03: 3 g via INTRAVENOUS
  Filled 2024-07-03: qty 150

## 2024-07-03 MED ORDER — STERILE WATER FOR IRRIGATION IR SOLN
Status: DC | PRN
  Administered 2024-07-03: 1000 mL

## 2024-07-03 MED ORDER — CHLORHEXIDINE GLUCONATE 0.12 % MT SOLN
15.0000 mL | Freq: Once | OROMUCOSAL | Status: AC
  Administered 2024-07-03: 15 mL via OROMUCOSAL

## 2024-07-03 MED ORDER — SODIUM CHLORIDE 0.9 % IV SOLN: 12.5000 mg | INTRAVENOUS | Status: DC | PRN

## 2024-07-03 SURGICAL SUPPLY — 21 items
BAG DRAIN URO TABLE W/ADPT NS (BAG) ×1 IMPLANT
BAG HAMPER (MISCELLANEOUS) ×1 IMPLANT
BAG URINE DRAIN 2000ML AR STRL (UROLOGICAL SUPPLIES) ×1 IMPLANT
BALLOON NEPHROSTOMY (BALLOONS) ×1 IMPLANT
BALLOON OPTILUME DCB 24X5X75 (BALLOONS) IMPLANT
CATH FOLEY 2WAY SLVR 5CC 18FR (CATHETERS) ×1 IMPLANT
CLOTH BEACON ORANGE TIMEOUT ST (SAFETY) ×1 IMPLANT
GLOVE BIO SURGEON STRL SZ8 (GLOVE) ×1 IMPLANT
GLOVE BIOGEL PI IND STRL 7.0 (GLOVE) ×2 IMPLANT
GOWN STRL REUS W/TWL LRG LVL3 (GOWN DISPOSABLE) ×1 IMPLANT
GOWN STRL REUS W/TWL XL LVL3 (GOWN DISPOSABLE) ×1 IMPLANT
GUIDEWIRE STR DUAL SENSOR (WIRE) ×1 IMPLANT
KIT TURNOVER CYSTO (KITS) ×1 IMPLANT
MANIFOLD NEPTUNE II (INSTRUMENTS) ×1 IMPLANT
PACK CYSTO (CUSTOM PROCEDURE TRAY) ×1 IMPLANT
PAD ARMBOARD POSITIONER FOAM (MISCELLANEOUS) ×1 IMPLANT
POSITIONER HEAD 8X9X4 ADT (SOFTGOODS) ×1 IMPLANT
SOLN STERILE WATER 1000 ML (IV SOLUTION) ×1 IMPLANT
SOLN STERILE WATER BTL 1000 ML (IV SOLUTION) IMPLANT
TOWEL OR 17X26 4PK STRL BLUE (TOWEL DISPOSABLE) ×1 IMPLANT
WATER STERILE IRR 3000ML UROMA (IV SOLUTION) ×1 IMPLANT

## 2024-07-03 NOTE — Op Note (Signed)
 Preoperative diagnosis: bulbar urethral stricture   Postoperative diagnosis: Same   Procedure: 1. Cystoscopy 2. Urethral dilation 3. Optilume treatment of urethral stricture   Attending: Belvie Clara   Anesthesia: General   History of blood loss: Minimal   Antibiotics: Ancef    Specimens: none   Drains: 18 french foley   Findings: dense bulbar urethral stricture, 3cm segment.  No masses/lesions in the bladder.   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 After discussing treatment options the patient wishes to proceed with cystoscopy with urethral dilation and optilume treatment.   Procedure in detail: Prior to procedure consent was obtained.  Patient was brought to the operating room and a brief timeout was done to ensure correct patient, correct procedure, correct site.  General anesthesia was administered and patient was placed in dorsal lithotomy position.  His genitalia was then prepped and draped in usual sterile fashion. We then passed a 17 French cystoscope up to the bulbar urethra where we encountered a dense stricture.  We were able to pass a wire across the stricture and into the bladder. We then advanced a 30 french urethral dilation balloon across the stricture under direct vision. We then inflated the balloon to 16cm of water  and held the pressure for 3 minutes. We then deflated the balloon and advanced the Optilume catheter across the stricture. We then inflated the balloon under direct vision to 10cm of water . The pressure was held for 7 minutes. We then deflated the balloon and removed the balloon and the wire. We then placed a 20 french foley and the bladder was drained.This then concluded the procedure which was well tolerated by the patient.   Complications: None   Condition: Stable, extubated, transferred to PACU.   Plan: Patient  is to be discharged home and followup in 4 days for a voiding trial.

## 2024-07-03 NOTE — Anesthesia Preprocedure Evaluation (Signed)
 Anesthesia Evaluation  Patient identified by MRN, date of birth, ID band Patient awake    Reviewed: Allergy & Precautions, H&P , NPO status , Patient's Chart, lab work & pertinent test results, reviewed documented beta blocker date and time   Airway Mallampati: II  TM Distance: >3 FB Neck ROM: full    Dental no notable dental hx. (+) Dental Advisory Given, Teeth Intact   Pulmonary neg pulmonary ROS, former smoker   Pulmonary exam normal breath sounds clear to auscultation       Cardiovascular Exercise Tolerance: Good hypertension, Normal cardiovascular exam Rhythm:regular Rate:Normal     Neuro/Psych  Headaches PSYCHIATRIC DISORDERS Anxiety Depression    CVA negative neurological ROS  negative psych ROS   GI/Hepatic negative GI ROS,,,(+) Cirrhosis       Portal hypertension but no ascities   Endo/Other  diabetes, Type 2    Renal/GU Renal disease  negative genitourinary   Musculoskeletal  (+) Arthritis , Osteoarthritis,    Abdominal   Peds  Hematology negative hematology ROS (+)   Anesthesia Other Findings   Reproductive/Obstetrics negative OB ROS                              Anesthesia Physical Anesthesia Plan  ASA: 3  Anesthesia Plan: General   Post-op Pain Management: Dilaudid  IV   Induction: Intravenous  PONV Risk Score and Plan: Propofol  infusion  Airway Management Planned: Nasal Cannula and Natural Airway  Additional Equipment: None  Intra-op Plan:   Post-operative Plan:   Informed Consent: I have reviewed the patients History and Physical, chart, labs and discussed the procedure including the risks, benefits and alternatives for the proposed anesthesia with the patient or authorized representative who has indicated his/her understanding and acceptance.     Dental Advisory Given  Plan Discussed with: CRNA  Anesthesia Plan Comments:         Anesthesia  Quick Evaluation

## 2024-07-03 NOTE — Anesthesia Postprocedure Evaluation (Signed)
 Anesthesia Post Note  Patient: Malik Hill  Procedure(s) Performed: CYSTOSCOPY, WITH URETHRAL DILATION WITH OPTILUME (Urethra)  Patient location during evaluation: PACU Anesthesia Type: General Level of consciousness: awake and alert Pain management: pain level controlled Vital Signs Assessment: post-procedure vital signs reviewed and stable Respiratory status: spontaneous breathing, nonlabored ventilation and respiratory function stable Cardiovascular status: blood pressure returned to baseline and stable Postop Assessment: no apparent nausea or vomiting Anesthetic complications: no   There were no known notable events for this encounter.   Last Vitals:  Vitals:   07/03/24 1604 07/03/24 1615  BP: (!) 146/90 126/79  Pulse: 76 84  Resp: 13 14  Temp: 36.7 C   SpO2: 97% 93%    Last Pain:  Vitals:   07/03/24 1615  TempSrc:   PainSc: 0-No pain                 Hula Tasso L Kareema Keitt

## 2024-07-03 NOTE — Anesthesia Procedure Notes (Signed)
 Procedure Name: LMA Insertion Date/Time: 07/03/2024 3:29 PM  Performed by: Para Jerelene CROME, CRNAPre-anesthesia Checklist: Patient identified, Emergency Drugs available, Suction available and Patient being monitored Patient Re-evaluated:Patient Re-evaluated prior to induction Oxygen Delivery Method: Circle system utilized Preoxygenation: Pre-oxygenation with 100% oxygen Induction Type: IV induction Ventilation: Mask ventilation without difficulty LMA: LMA inserted LMA Size: 4.0 Number of attempts: 1 Placement Confirmation: positive ETCO2, CO2 detector and breath sounds checked- equal and bilateral ETT to lip (cm): No measurement markings on Ambu Aurastraight LMA size 5. Tube secured with: Tape Dental Injury: Teeth and Oropharynx as per pre-operative assessment  Comments: Atraumatic insertion of LMA size 5. Lips and teeth remain in preoperative condition.

## 2024-07-03 NOTE — H&P (Signed)
 HPI: Malik Hill is a 63yo here for followup for urethral stricture and BPH. IPSS 23 QOL 5 on flomax  0.4mg  daily. Urine stream is weak, he has intermittent straining to urinate. Nocturia 4-5x. He notes over the past month he has had a weaker urinary stream. He has intermittent burning with urination     PMH:     Past Medical History:  Diagnosis Date   Anxiety     BPH (benign prostatic hyperplasia)     Cirrhosis (HCC)     Depression     Diabetes mellitus without complication (HCC)     Headache     History of kidney stones     MDD (major depressive disorder)     Memory loss     Osteoarthritis     Stroke (HCC) 09/2021          Surgical History:      Past Surgical History:  Procedure Laterality Date   BIOPSY   06/10/2023    Procedure: BIOPSY;  Surgeon: Cinderella Deatrice FALCON, MD;  Location: [facility];  Service: Endoscopy;;   BUNIONECTOMY       COLONOSCOPY   06/09/2007   COLONOSCOPY WITH PROPOFOL  N/A 06/10/2023    Procedure: COLONOSCOPY WITH PROPOFOL ;  Surgeon: Cinderella Deatrice FALCON, MD;  Location: [facility];  Service: Endoscopy;  Laterality: N/A;  9:45am;asa 3   CYSTOSCOPY WITH RETROGRADE PYELOGRAM, URETEROSCOPY AND STENT PLACEMENT Left 01/21/2023    Procedure: CYSTOSCOPY WITH RETROGRADE PYELOGRAM, URETEROSCOPY AND STENT PLACEMENT;  Surgeon: Sherrilee Belvie CROME, MD;  Location: [facility];  Service: Urology;  Laterality: Left;   CYSTOSCOPY WITH RETROGRADE PYELOGRAM, URETEROSCOPY AND STENT PLACEMENT Left 07/01/2023    Procedure: CYSTOSCOPY WITH RETROGRADE PYELOGRAM, URETEROSCOPY AND STENT PLACEMENT;  Surgeon: Sherrilee Belvie CROME, MD;  Location: [facility];  Service: Urology;  Laterality: Left;   ESOPHAGOGASTRODUODENOSCOPY (EGD) WITH PROPOFOL  N/A 06/10/2023    Procedure: ESOPHAGOGASTRODUODENOSCOPY (EGD) WITH PROPOFOL ;  Surgeon: Cinderella Deatrice FALCON, MD;  Location: [facility];  Service: Endoscopy;  Laterality: N/A;  9:45am;asa 3   HOLMIUM LASER APPLICATION Left 07/01/2023    Procedure: HOLMIUM LASER  APPLICATION;  Surgeon: Sherrilee Belvie CROME, MD;  Location: [facility];  Service: Urology;  Laterality: Left;   KNEE SURGERY Left 2015   PENILE ADHESIONS LYSIS       POLYPECTOMY   06/10/2023    Procedure: POLYPECTOMY;  Surgeon: Cinderella Deatrice FALCON, MD;  Location: [facility];  Service: Endoscopy;;   TONSILLECTOMY AND ADENOIDECTOMY        age 63          Home Medications:  Allergies as of 05/01/2024         Reactions    Percocet [oxycodone -acetaminophen ] Other (See Comments)    Hallucinations/Dizziness/sweaty    Penicillins Rash            Medication List           Accurate as of May 01, 2024 11:55 AM. If you have any questions, ask your nurse or doctor.              acetaminophen  500 MG tablet Commonly known as: TYLENOL  Take 1,000 mg by mouth every 6 (six) hours as needed for moderate pain or headache.    ALPRAZolam  1 MG tablet Commonly known as: XANAX  Take 1 mg by mouth 4 (four) times daily.    cetirizine 10 MG tablet Commonly known as: ZYRTEC Take 10 mg by mouth daily as needed for allergies.    ezetimibe  10  MG tablet Commonly known as: ZETIA  Take 1 tablet (10 mg total) by mouth daily.    fluticasone 50 MCG/ACT nasal spray Commonly known as: FLONASE Place 2 sprays into both nostrils daily as needed for allergies or rhinitis.    glipiZIDE 10 MG 24 hr tablet Commonly known as: GLUCOTROL XL Take 10 mg by mouth 2 (two) times daily.    losartan  25 MG tablet Commonly known as: COZAAR  Take 25 mg by mouth daily.    Nurtec 75 MG Tbdp Generic drug: Rimegepant Sulfate Take 1 tablet (75 mg total) by mouth daily as needed.    omeprazole 20 MG tablet Commonly known as: PRILOSEC OTC Take 20 mg by mouth daily as needed (acid reflux).    ondansetron  4 MG tablet Commonly known as: Zofran  Take 1 tablet (4 mg total) by mouth daily as needed for nausea or vomiting.    PARoxetine  40 MG tablet Commonly known as: PAXIL  Take 40 mg by mouth daily.    QUEtiapine 100 MG  tablet Commonly known as: SEROQUEL Take 100 mg by mouth at bedtime.    Qulipta  60 MG Tabs Generic drug: Atogepant  Take 1 tablet (60 mg total) by mouth daily.    tamsulosin  0.4 MG Caps capsule Commonly known as: FLOMAX  Take 1 capsule (0.4 mg total) by mouth daily after supper.    traMADol  50 MG tablet Commonly known as: ULTRAM  Take 50 mg by mouth every 8 (eight) hours as needed.    traZODone  100 MG tablet Commonly known as: DESYREL  Take 100 mg by mouth at bedtime.    Trulicity 1.5 MG/0.5ML Soaj Generic drug: Dulaglutide Inject 1.5 mg into the skin every Wednesday.    Vitamin D  (Ergocalciferol ) 1.25 MG (50000 UNIT) Caps capsule Commonly known as: DRISDOL  Take 1 capsule (50,000 Units total) by mouth every 7 (seven) days.             Allergies:  Allergies       Allergies  Allergen Reactions   Percocet [Oxycodone -Acetaminophen ] Other (See Comments)      Hallucinations/Dizziness/sweaty   Penicillins Rash        Family History:      Family History  Problem Relation Age of Onset   Hypertension Mother     Hypothyroidism Mother     Heart attack Father     Cancer Sister            Social History:  reports that he has quit smoking. His smoking use included cigarettes. He has never used smokeless tobacco. He reports that he does not drink alcohol and does not use drugs.   ROS: All other review of systems were reviewed and are negative except what is noted above in HPI   Physical Exam: BP 113/76   Pulse 91   Constitutional:  Alert and oriented, No acute distress. HEENT: [place] AT, moist mucus membranes.  Trachea midline, no masses. Cardiovascular: No clubbing, cyanosis, or edema. Respiratory: Normal respiratory effort, no increased work of breathing. GI: Abdomen is soft, nontender, nondistended, no abdominal masses GU: No CVA tenderness.  Lymph: No cervical or inguinal lymphadenopathy. Skin: No rashes, bruises or suspicious lesions. Neurologic: Grossly intact, no  focal deficits, moving all 4 extremities. Psychiatric: Normal mood and affect.   Laboratory Data: Recent Labs       Lab Results  Component Value Date    WBC 4.4 06/08/2023    HGB 15.1 06/08/2023    HCT 46.7 06/08/2023    MCV 87.1 06/08/2023    PLT  128 (L) 06/08/2023        Recent Labs       Lab Results  Component Value Date    CREATININE 1.20 05/18/2023        Recent Labs  No results found for: PSA     Recent Labs  No results found for: TESTOSTERONE     Recent Labs       Lab Results  Component Value Date    HGBA1C 7.0 (H) 01/18/2023        Urinalysis Labs (Brief)          Component Value Date/Time    COLORURINE YELLOW 09/25/2021 1411    APPEARANCEUR Clear 03/03/2024 1020    LABSPEC 1.015 09/25/2021 1411    PHURINE 5.5 09/25/2021 1411    GLUCOSEU 3+ (A) 03/03/2024 1020    HGBUR LARGE (A) 09/25/2021 1411    BILIRUBINUR Negative 03/03/2024 1020    KETONESUR 15 (A) 09/25/2021 1411    PROTEINUR Trace 03/03/2024 1020    PROTEINUR TRACE (A) 09/25/2021 1411    UROBILINOGEN 0.2 02/17/2008 1558    NITRITE Negative 03/03/2024 1020    NITRITE NEGATIVE 09/25/2021 1411    LEUKOCYTESUR Negative 03/03/2024 1020    LEUKOCYTESUR NEGATIVE 09/25/2021 1411        Recent Labs       Lab Results  Component Value Date    LABMICR See below: 03/03/2024    WBCUA 0-5 03/03/2024    LABEPIT 0-10 03/03/2024    MUCUS Present (A) 03/03/2024    BACTERIA None seen 03/03/2024        Pertinent Imaging:   No results found for this or any previous visit.   No results found for this or any previous visit.   No results found for this or any previous visit.   No results found for this or any previous visit.   Results for orders placed during the hospital encounter of 01/19/24   Ultrasound renal complete   Narrative CLINICAL DATA:  nephrolithiasis   EXAM: RENAL / URINARY TRACT ULTRASOUND COMPLETE   COMPARISON:  July 21, 2023   FINDINGS: Right Kidney:    Renal measurements: 12.2 x 7.1 x 7.4 cm = volume: 334 mL. Echogenicity within normal limits. No mass or hydronephrosis visualized.   Left Kidney:   Renal measurements: 14.9 x 5.8 x 5.5 cm = volume: 249 mL. Echogenicity within normal limits. No mass visualized. There is mild hydronephrosis with increased pelviectasis, increased since prior ultrasound.   Bladder:   Appears normal for degree of bladder distention.   Other:   None.   IMPRESSION: There is mild left hydronephrosis with increased pelviectasis, overall increased since prior ultrasound.   If there is a persistent clinical concern for obstructing nephrolithiasis, dedicated CT abdomen pelvis would be recommended.   These results will be called to the ordering clinician or representative by the Radiologist Assistant, and communication documented in the PACS or [employer].     Electronically Signed By: Corean Salter M.D. On: 02/02/2024 15:09   No results found for this or any previous visit.   Results for orders placed during the hospital encounter of 12/04/22   CT HEMATURIA WORKUP   Narrative CLINICAL DATA:  Gross hematuria * Tracking Code: BO *   EXAM: CT ABDOMEN AND PELVIS WITHOUT AND WITH CONTRAST   TECHNIQUE: Multidetector CT imaging of the abdomen and pelvis was performed following the standard protocol before and following the bolus administration of intravenous contrast.   RADIATION DOSE  REDUCTION: This exam was performed according to the departmental dose-optimization program which includes automated exposure control, adjustment of the mA and/or kV according to patient size and/or use of iterative reconstruction technique.   CONTRAST:  OMNIPAQUE  IOHEXOL  350 MG/ML SOLN   COMPARISON:  02/13/2008   FINDINGS: Lower chest: No acute abnormality.   Hepatobiliary: No solid liver abnormality is seen. Coarse, nodular contour of the liver. No gallstones, gallbladder wall  thickening, or biliary dilatation.   Pancreas: Unremarkable. No pancreatic ductal dilatation or surrounding inflammatory changes.   Spleen: Splenomegaly, maximum coronal span 16.5 cm.   Adrenals/Urinary Tract: Adrenal glands are unremarkable. Numerous left renal calculi. Heterogeneous, expansile and faintly calcified appearance of the left renal pelvis filling defect measuring 4.9 x 2.9 x 1.8 cm (series 10, image 64, series 7, image 52). The right kidney is normal, without renal calculi, solid lesion, or hydronephrosis. Mild wall thickening of the urinary bladder.   Stomach/Bowel: Stomach is within normal limits. Diverticulum of the descending duodenum. Appendix appears normal. No evidence of bowel wall thickening, distention, or inflammatory changes. Descending and sigmoid diverticulosis.   Vascular/Lymphatic: No significant vascular findings are present. No enlarged abdominal or pelvic lymph nodes.   Reproductive: No mass or other significant abnormality.   Other: No abdominal wall hernia or abnormality. No ascites.   Musculoskeletal: No acute or significant osseous findings.   IMPRESSION: 1. Numerous left renal calculi. 2. Heterogeneous, expansile and faintly calcified appearance of the left renal pelvis filling defect measuring 4.9 x 2.9 x 1.8 cm. This may reflect a large, partially calcified staghorn calculus but appearance is somewhat concerning for urothelial malignancy. 3. No evidence of lymphadenopathy or metastatic disease in the abdomen or pelvis. 4. Mild wall thickening of the urinary bladder, likely related to chronic outlet obstruction 5. Cirrhotic morphology of the liver.  Splenomegaly. 6. Descending and sigmoid diverticulosis without evidence of acute diverticulitis.   These results will be called to the ordering clinician or representative by the Radiologist Assistant, and communication documented in the PACS or [employer].     Electronically  Signed By: Marolyn JONETTA Jaksch M.D. On: 12/05/2022 17:04   Results for orders placed in visit on 02/08/24   CT RENAL STONE STUDY   Narrative CLINICAL DATA:  Abdominal pain/flank pain   EXAM: CT ABDOMEN AND PELVIS WITHOUT CONTRAST   TECHNIQUE: Multidetector CT imaging of the abdomen and pelvis was performed following the standard protocol without IV contrast.   RADIATION DOSE REDUCTION: This exam was performed according to the departmental dose-optimization program which includes automated exposure control, adjustment of the mA and/or kV according to patient size and/or use of iterative reconstruction technique.   COMPARISON:  Renal ultrasound performed January 19, 2024. CT of the abdomen and pelvis performed May 03, 2023   FINDINGS: Lower chest: No acute abnormality.   Hepatobiliary: Cirrhotic morphology of the liver. No gallstones, gallbladder wall thickening, or biliary dilatation.   Pancreas: Unremarkable. No pancreatic ductal dilatation or surrounding inflammatory changes.   Spleen: Enlarged spleen, similar.   Adrenals/Urinary Tract: The adrenal glands are within normal limits. There is mild prominence of the left renal collecting system. Prominent left renal pelvis. 4 mm stone in the midpole left kidney. Urinary bladder is partially decompressed.   Stomach/Bowel: No dilated loops of bowel. The appendix is well seen and within normal limits. Scattered colonic diverticula.   Vascular/Lymphatic: Nothing significant.   Reproductive: Prostate is unremarkable.   Other: Nothing significant.   Musculoskeletal: Mild degenerative changes in the  lumbar spine.   IMPRESSION: 1. Left-sided nephrolithiasis. 2. Prominent renal pelvis without radiopaque calculi identified within the left renal pelvis or left ureter.     Electronically Signed By: Maude Naegeli M.D. On: 02/27/2024 09:25     Cystoscopy Procedure Note   Patient identification was confirmed, informed consent  was obtained, and patient was prepped using Betadine solution.  Lidocaine  jelly was administered per urethral meatus.       Pre-Procedure: - Inspection reveals a normal caliber ureteral meatus.   Procedure: The flexible cystoscope was introduced without difficulty - dense bulbar urethral stricture is present, short segment.     Post-Procedure: - Patient tolerated the procedure well   Assessment & Plan:     1. Benign prostatic hyperplasia with urinary obstruction (Primary) Continue flomax  0.4mg  daily - Urinalysis, Routine w reflex microscopic - BLADDER SCAN AMB NON-IMAGING   2. Urethral stricture -The risks/benefits/alternatives to urethral dilation and optilume was explained to the patient and he understands and wishes to proceed with surgery   No follow-ups on file.   Belvie Clara, MD   [facility] [place]

## 2024-07-03 NOTE — Transfer of Care (Signed)
 Immediate Anesthesia Transfer of Care Note  Patient: Malik Hill  Procedure(s) Performed: CYSTOSCOPY, WITH URETHRAL DILATION WITH OPTILUME (Urethra)  Patient Location: PACU  Anesthesia Type:General  Level of Consciousness: awake  Airway & Oxygen Therapy: Patient Spontanous Breathing and Patient connected to face mask oxygen  Post-op Assessment: Report given to RN and Post -op Vital signs reviewed and stable  Post vital signs: Reviewed and stable  Last Vitals:  Vitals Value Taken Time  BP 146/90   Temp 98.1   Pulse 79 07/03/24 16:03  Resp 11 07/03/24 16:03  SpO2 97 % 07/03/24 16:03  Vitals shown include unfiled device data.  Last Pain:  Vitals:   07/03/24 1335  TempSrc: Oral  PainSc: 0-No pain      Patients Stated Pain Goal: 6 (07/03/24 1335)  Complications: No notable events documented.

## 2024-07-04 ENCOUNTER — Other Ambulatory Visit: Payer: Self-pay

## 2024-07-04 ENCOUNTER — Encounter (HOSPITAL_COMMUNITY): Payer: Self-pay | Admitting: Urology

## 2024-07-04 ENCOUNTER — Telehealth: Payer: Self-pay | Admitting: Urology

## 2024-07-04 DIAGNOSIS — N200 Calculus of kidney: Secondary | ICD-10-CM

## 2024-07-04 NOTE — Telephone Encounter (Signed)
 We understand MD said to return in 4 days for a voiding trial however, we do not do voiding trials on Friday because of the risk of being in retention and ending up in the ER over the weekend.  He is scheduled for the soonest [place] appt on Monday 10/06 which is appropriate.  Please notify patient he will have to keep his appointments as scheduled.  Thank you.

## 2024-07-04 NOTE — Telephone Encounter (Signed)
 Patient called into the office today with general questions/concerns regarding surgery follow up. Malik Hill told him to call office and come in Friday. Patient may be reached at [phone] to discuss questions.

## 2024-07-05 ENCOUNTER — Ambulatory Visit: Admitting: Urology

## 2024-07-06 ENCOUNTER — Ambulatory Visit: Admitting: Urology

## 2024-07-07 ENCOUNTER — Other Ambulatory Visit: Payer: Self-pay

## 2024-07-07 DIAGNOSIS — N200 Calculus of kidney: Secondary | ICD-10-CM

## 2024-07-10 ENCOUNTER — Ambulatory Visit (INDEPENDENT_AMBULATORY_CARE_PROVIDER_SITE_OTHER)

## 2024-07-10 DIAGNOSIS — N401 Enlarged prostate with lower urinary tract symptoms: Secondary | ICD-10-CM | POA: Diagnosis not present

## 2024-07-10 DIAGNOSIS — N138 Other obstructive and reflux uropathy: Secondary | ICD-10-CM

## 2024-07-10 LAB — BLADDER VOIDING TRIAL: Scan Result: 208

## 2024-07-10 MED ORDER — CIPROFLOXACIN HCL 500 MG PO TABS
500.0000 mg | ORAL_TABLET | Freq: Once | ORAL | Status: AC
  Administered 2024-07-10: 500 mg via ORAL

## 2024-07-10 NOTE — Progress Notes (Addendum)
 Fill and Pull Catheter Removal  Patient is present today for a catheter removal due to CYSTOSCOPY, WITH URETHRAL DILATION WITH OPTILUME .  of sterile water  was instilled into the bladder when the patient felt the urge to urinate. 10ml of water  was then drained from the balloon.  A 18FR foley cath was removed from the bladder no complications were noted .  Foley catheter intact and time of removal. Patient as then given some time to void on their own.  Patient can void  on their own after some time.  Patient tolerated well.  One oral prophylactic antibiotic given per MD orders  Performed ab:[id number], CMA  Follow up/ Additional notes: Return at 1 pm for PVR check   Bladder Scan completed today.  Patient can void prior to the bladder scan. Bladder scan result: 208  Performed By: Carlos, CMA  Additional notes-

## 2024-07-11 MED ORDER — TAMSULOSIN HCL 0.4 MG PO CAPS: 0.4000 mg | ORAL_CAPSULE | Freq: Every day | ORAL | 0 refills | Status: DC

## 2024-07-17 ENCOUNTER — Encounter: Payer: Self-pay | Admitting: Urology

## 2024-07-17 ENCOUNTER — Ambulatory Visit (INDEPENDENT_AMBULATORY_CARE_PROVIDER_SITE_OTHER): Admitting: Urology

## 2024-07-17 VITALS — BP 115/81 | HR 97

## 2024-07-17 DIAGNOSIS — R3912 Poor urinary stream: Secondary | ICD-10-CM

## 2024-07-17 DIAGNOSIS — N35011 Post-traumatic bulbous urethral stricture: Secondary | ICD-10-CM

## 2024-07-17 DIAGNOSIS — N200 Calculus of kidney: Secondary | ICD-10-CM

## 2024-07-17 DIAGNOSIS — N401 Enlarged prostate with lower urinary tract symptoms: Secondary | ICD-10-CM | POA: Diagnosis not present

## 2024-07-17 DIAGNOSIS — N138 Other obstructive and reflux uropathy: Secondary | ICD-10-CM

## 2024-07-17 LAB — MICROSCOPIC EXAMINATION: Bacteria, UA: NONE SEEN

## 2024-07-17 LAB — URINALYSIS, ROUTINE W REFLEX MICROSCOPIC
Bilirubin, UA: NEGATIVE
Ketones, UA: NEGATIVE
Leukocytes,UA: NEGATIVE
Nitrite, UA: NEGATIVE
Protein,UA: NEGATIVE
Specific Gravity, UA: 1.01 (ref 1.005–1.030)
Urobilinogen, Ur: 1 mg/dL (ref 0.2–1.0)
pH, UA: 7 (ref 5.0–7.5)

## 2024-07-17 LAB — BLADDER SCAN AMB NON-IMAGING: Scan Result: 141

## 2024-07-17 MED ORDER — TAMSULOSIN HCL 0.4 MG PO CAPS: 0.4000 mg | ORAL_CAPSULE | Freq: Every day | ORAL | 11 refills | Status: AC

## 2024-07-17 NOTE — Progress Notes (Signed)
   Patient can void prior to the bladder scan. Bladder scan result: 141  Performed By: [facility] LPN

## 2024-07-17 NOTE — Progress Notes (Signed)
 07/17/2024 1:42 PM   Malik Hill 1960/12/29 [id number]  Referring provider: Atilano Deward ORN, MD [address] [place],  [place] [address]  Followup BPh and urethral stricture   HPI: Mr Galli is a 63yo here for followup for BPh and urethral stricture. He has been doing well since the urethral dilation. No dysuria or hematuria. IPSS 14 QOL 2 on flomax  0.4mg  daily. Urine stream strong. No straining to urinate. Nocturia 2-3x.    PMH: Past Medical History:  Diagnosis Date   Anxiety    BPH (benign prostatic hyperplasia)    Cirrhosis (HCC)    Depression    Diabetes mellitus without complication (HCC)    Headache    History of kidney stones    MDD (major depressive disorder)    Memory loss    Osteoarthritis    Stroke (HCC) 09/2021    Surgical History: Past Surgical History:  Procedure Laterality Date   BIOPSY  06/10/2023   Procedure: BIOPSY;  Surgeon: Cinderella Deatrice FALCON, MD;  [facility];  Service: Endoscopy;;   BUNIONECTOMY     COLONOSCOPY  06/09/2007   COLONOSCOPY WITH PROPOFOL  N/A 06/10/2023   Procedure: COLONOSCOPY WITH PROPOFOL ;  Surgeon: Cinderella Deatrice FALCON, MD;  Location: [facility];  Service: Endoscopy;  Laterality: N/A;  9:45am;asa 3   CYSTOSCOPY WITH RETROGRADE PYELOGRAM, URETEROSCOPY AND STENT PLACEMENT Left 01/21/2023   Procedure: CYSTOSCOPY WITH RETROGRADE PYELOGRAM, URETEROSCOPY AND STENT PLACEMENT;  Surgeon: Sherrilee Belvie CROME, MD;  Location: [facility];  Service: Urology;  Laterality: Left;   CYSTOSCOPY WITH RETROGRADE PYELOGRAM, URETEROSCOPY AND STENT PLACEMENT Left 07/01/2023   Procedure: CYSTOSCOPY WITH RETROGRADE PYELOGRAM, URETEROSCOPY AND STENT PLACEMENT;  Surgeon: Sherrilee Belvie CROME, MD;  Location: [facility];  Service: Urology;  Laterality: Left;   CYSTOSCOPY WITH URETHRAL DILATATION N/A 07/03/2024   Procedure: CYSTOSCOPY, WITH URETHRAL DILATION WITH OPTILUME;  Surgeon: Sherrilee Belvie CROME, MD;  Location: [facility];  Service: Urology;  Laterality:  N/A;   ESOPHAGOGASTRODUODENOSCOPY (EGD) WITH PROPOFOL  N/A 06/10/2023   Procedure: ESOPHAGOGASTRODUODENOSCOPY (EGD) WITH PROPOFOL ;  Surgeon: Cinderella Deatrice FALCON, MD;  [facility];  Service: Endoscopy;  Laterality: N/A;  9:45am;asa 3   HOLMIUM LASER APPLICATION Left 07/01/2023   Procedure: HOLMIUM LASER APPLICATION;  Surgeon: Sherrilee Belvie CROME, MD;  Location: [facility];  Service: Urology;  Laterality: Left;   KNEE SURGERY Left 2015   PENILE ADHESIONS LYSIS     POLYPECTOMY  06/10/2023   Procedure: POLYPECTOMY;  Surgeon: Cinderella Deatrice FALCON, MD;  [facility];  Service: Endoscopy;;   TONSILLECTOMY AND ADENOIDECTOMY     age 63    Home Medications:  Allergies as of 07/17/2024       Reactions   Penicillins Rash        Medication List        Accurate as of July 17, 2024  1:42 PM. If you have any questions, ask your nurse or doctor.          acetaminophen  500 MG tablet Commonly known as: TYLENOL  Take 1,000 mg by mouth every 6 (six) hours as needed for moderate pain or headache.   ALPRAZolam  1 MG tablet Commonly known as: XANAX  Take 1 mg by mouth 4 (four) times daily as needed for anxiety.   atenolol  25 MG tablet Commonly known as: TENORMIN  Take 25 mg by mouth 2 (two) times daily.   cetirizine 10 MG tablet Commonly known as: ZYRTEC Take 10 mg by mouth daily as needed  for allergies.   fluticasone 50 MCG/ACT nasal spray Commonly known as: FLONASE Place 2 sprays into both nostrils daily as needed for allergies or rhinitis.   glipiZIDE 10 MG 24 hr tablet Commonly known as: GLUCOTROL XL Take 10 mg by mouth 2 (two) times daily.   hydrochlorothiazide  12.5 MG tablet Commonly known as: HYDRODIURIL  Take 12.5 mg by mouth daily.   losartan  25 MG tablet Commonly known as: COZAAR  Take 25 mg by mouth daily.   omeprazole 20 MG tablet Commonly known as: PRILOSEC OTC Take 20 mg by mouth daily as needed (acid reflux).   oxyCODONE -acetaminophen  5-325 MG  tablet Commonly known as: PERCOCET/ROXICET Take 1 tablet by mouth every 6 (six) hours as needed for severe pain (pain score 7-10).   Ozempic (0.25 or 0.5 MG/DOSE) 2 MG/3ML Sopn Generic drug: Semaglutide(0.25 or 0.5MG /DOS) Inject 0.5 mg into the skin once a week. Thursdays   PARoxetine  40 MG tablet Commonly known as: PAXIL  Take 40 mg by mouth daily.   QUEtiapine 100 MG tablet Commonly known as: SEROQUEL Take 100 mg by mouth at bedtime.   Qulipta  60 MG Tabs Generic drug: Atogepant  Take 1 tablet (60 mg total) by mouth daily. What changed:  when to take this reasons to take this   tamsulosin  0.4 MG Caps capsule Commonly known as: FLOMAX  Take 1 capsule (0.4 mg total) by mouth daily after supper.   traZODone  50 MG tablet Commonly known as: DESYREL  Take 100 mg by mouth at bedtime.   Vitamin D  (Ergocalciferol ) 1.25 MG (50000 UNIT) Caps capsule Commonly known as: DRISDOL  Take 1 capsule (50,000 Units total) by mouth every 7 (seven) days.        Allergies:  Allergies  Allergen Reactions   Penicillins Rash    Family History: Family History  Problem Relation Age of Onset   Hypertension Mother    Hypothyroidism Mother    Heart attack Father    Cancer Sister     Social History:  reports that he has quit smoking. His smoking use included cigarettes. He has never used smokeless tobacco. He reports that he does not drink alcohol and does not use drugs.  ROS: All other review of systems were reviewed and are negative except what is noted above in HPI  Physical Exam: BP 115/81   Pulse 97   Constitutional:  Alert and oriented, No acute distress. HEENT: [place] AT, moist mucus membranes.  Trachea midline, no masses. Cardiovascular: No clubbing, cyanosis, or edema. Respiratory: Normal respiratory effort, no increased work of breathing. GI: Abdomen is soft, nontender, nondistended, no abdominal masses GU: No CVA tenderness.  Lymph: No cervical or inguinal  lymphadenopathy. Skin: No rashes, bruises or suspicious lesions. Neurologic: Grossly intact, no focal deficits, moving all 4 extremities. Psychiatric: Normal mood and affect.  Laboratory Data: Lab Results  Component Value Date   WBC 4.4 06/08/2023   HGB 15.1 06/08/2023   HCT 46.7 06/08/2023   MCV 87.1 06/08/2023   PLT 128 (L) 06/08/2023    Lab Results  Component Value Date   CREATININE 1.05 06/29/2024    No results found for: PSA  No results found for: TESTOSTERONE  Lab Results  Component Value Date   HGBA1C 7.7 (H) 06/29/2024    Urinalysis    Component Value Date/Time   COLORURINE YELLOW 09/25/2021 1411   APPEARANCEUR Clear 05/01/2024 1145   LABSPEC 1.015 09/25/2021 1411   PHURINE 5.5 09/25/2021 1411   GLUCOSEU 3+ (A) 05/01/2024 1145   HGBUR LARGE (A) 09/25/2021 1411  BILIRUBINUR Negative 05/01/2024 1145   KETONESUR 15 (A) 09/25/2021 1411   PROTEINUR Negative 05/01/2024 1145   PROTEINUR TRACE (A) 09/25/2021 1411   UROBILINOGEN 0.2 02/17/2008 1558   NITRITE Negative 05/01/2024 1145   NITRITE NEGATIVE 09/25/2021 1411   LEUKOCYTESUR Negative 05/01/2024 1145   LEUKOCYTESUR NEGATIVE 09/25/2021 1411    Lab Results  Component Value Date   LABMICR Comment 05/01/2024   WBCUA 0-5 03/03/2024   LABEPIT 0-10 03/03/2024   MUCUS Present (A) 03/03/2024   BACTERIA None seen 03/03/2024    Pertinent Imaging:  No results found for this or any previous visit.  No results found for this or any previous visit.  No results found for this or any previous visit.  No results found for this or any previous visit.  Results for orders placed during the hospital encounter of 01/19/24  Ultrasound renal complete  Narrative CLINICAL DATA:  nephrolithiasis  EXAM: RENAL / URINARY TRACT ULTRASOUND COMPLETE  COMPARISON:  July 21, 2023  FINDINGS: Right Kidney:  Renal measurements: 12.2 x 7.1 x 7.4 cm = volume: 334 mL. Echogenicity within normal limits. No mass or  hydronephrosis visualized.  Left Kidney:  Renal measurements: 14.9 x 5.8 x 5.5 cm = volume: 249 mL. Echogenicity within normal limits. No mass visualized. There is mild hydronephrosis with increased pelviectasis, increased since prior ultrasound.  Bladder:  Appears normal for degree of bladder distention.  Other:  None.  IMPRESSION: There is mild left hydronephrosis with increased pelviectasis, overall increased since prior ultrasound.  If there is a persistent clinical concern for obstructing nephrolithiasis, dedicated CT abdomen pelvis would be recommended.  These results will be called to the ordering clinician or representative by the Radiologist Assistant, and communication documented in the PACS or [employer].   Electronically Signed By: Corean Salter M.D. On: 02/02/2024 15:09  No results found for this or any previous visit.  Results for orders placed during the hospital encounter of 12/04/22  CT HEMATURIA WORKUP  Narrative CLINICAL DATA:  Gross hematuria * Tracking Code: BO *  EXAM: CT ABDOMEN AND PELVIS WITHOUT AND WITH CONTRAST  TECHNIQUE: Multidetector CT imaging of the abdomen and pelvis was performed following the standard protocol before and following the bolus administration of intravenous contrast.  RADIATION DOSE REDUCTION: This exam was performed according to the departmental dose-optimization program which includes automated exposure control, adjustment of the mA and/or kV according to patient size and/or use of iterative reconstruction technique.  CONTRAST:  OMNIPAQUE  IOHEXOL  350 MG/ML SOLN  COMPARISON:  02/13/2008  FINDINGS: Lower chest: No acute abnormality.  Hepatobiliary: No solid liver abnormality is seen. Coarse, nodular contour of the liver. No gallstones, gallbladder wall thickening, or biliary dilatation.  Pancreas: Unremarkable. No pancreatic ductal dilatation or surrounding inflammatory  changes.  Spleen: Splenomegaly, maximum coronal span 16.5 cm.  Adrenals/Urinary Tract: Adrenal glands are unremarkable. Numerous left renal calculi. Heterogeneous, expansile and faintly calcified appearance of the left renal pelvis filling defect measuring 4.9 x 2.9 x 1.8 cm (series 10, image 64, series 7, image 52). The right kidney is normal, without renal calculi, solid lesion, or hydronephrosis. Mild wall thickening of the urinary bladder.  Stomach/Bowel: Stomach is within normal limits. Diverticulum of the descending duodenum. Appendix appears normal. No evidence of bowel wall thickening, distention, or inflammatory changes. Descending and sigmoid diverticulosis.  Vascular/Lymphatic: No significant vascular findings are present. No enlarged abdominal or pelvic lymph nodes.  Reproductive: No mass or other significant abnormality.  Other: No abdominal wall hernia  or abnormality. No ascites.  Musculoskeletal: No acute or significant osseous findings.  IMPRESSION: 1. Numerous left renal calculi. 2. Heterogeneous, expansile and faintly calcified appearance of the left renal pelvis filling defect measuring 4.9 x 2.9 x 1.8 cm. This may reflect a large, partially calcified staghorn calculus but appearance is somewhat concerning for urothelial malignancy. 3. No evidence of lymphadenopathy or metastatic disease in the abdomen or pelvis. 4. Mild wall thickening of the urinary bladder, likely related to chronic outlet obstruction 5. Cirrhotic morphology of the liver.  Splenomegaly. 6. Descending and sigmoid diverticulosis without evidence of acute diverticulitis.  These results will be called to the ordering clinician or representative by the Radiologist Assistant, and communication documented in the PACS or [employer].   Electronically Signed By: Marolyn JONETTA Jaksch M.D. On: 12/05/2022 17:04  Results for orders placed in visit on 02/08/24  CT RENAL STONE  STUDY  Narrative CLINICAL DATA:  Abdominal pain/flank pain  EXAM: CT ABDOMEN AND PELVIS WITHOUT CONTRAST  TECHNIQUE: Multidetector CT imaging of the abdomen and pelvis was performed following the standard protocol without IV contrast.  RADIATION DOSE REDUCTION: This exam was performed according to the departmental dose-optimization program which includes automated exposure control, adjustment of the mA and/or kV according to patient size and/or use of iterative reconstruction technique.  COMPARISON:  Renal ultrasound performed January 19, 2024. CT of the abdomen and pelvis performed May 03, 2023  FINDINGS: Lower chest: No acute abnormality.  Hepatobiliary: Cirrhotic morphology of the liver. No gallstones, gallbladder wall thickening, or biliary dilatation.  Pancreas: Unremarkable. No pancreatic ductal dilatation or surrounding inflammatory changes.  Spleen: Enlarged spleen, similar.  Adrenals/Urinary Tract: The adrenal glands are within normal limits. There is mild prominence of the left renal collecting system. Prominent left renal pelvis. 4 mm stone in the midpole left kidney. Urinary bladder is partially decompressed.  Stomach/Bowel: No dilated loops of bowel. The appendix is well seen and within normal limits. Scattered colonic diverticula.  Vascular/Lymphatic: Nothing significant.  Reproductive: Prostate is unremarkable.  Other: Nothing significant.  Musculoskeletal: Mild degenerative changes in the lumbar spine.  IMPRESSION: 1. Left-sided nephrolithiasis. 2. Prominent renal pelvis without radiopaque calculi identified within the left renal pelvis or left ureter.   Electronically Signed By: Maude Naegeli M.D. On: 02/27/2024 09:25   Assessment & Plan:    1. Benign prostatic hyperplasia with urinary obstruction (Primary) Continue flomax  0.4mg  daily - Urinalysis, Routine w reflex microscopic - BLADDER SCAN AMB NON-IMAGING  2. Post-traumatic bulbous  urethral stricture Followup 3 months with PVR  3. Weak urinary stream Continue flomax  0.4mg  daily   No follow-ups on file.  Belvie Clara, MD  [facility] [place]

## 2024-07-17 NOTE — Patient Instructions (Signed)

## 2024-07-29 ENCOUNTER — Other Ambulatory Visit: Payer: Self-pay

## 2024-07-29 ENCOUNTER — Emergency Department (HOSPITAL_COMMUNITY): Admission: EM | Admit: 2024-07-29 | Discharge: 2024-07-29 | Disposition: A | Discharge status: Home / Self Care

## 2024-07-29 ENCOUNTER — Encounter (HOSPITAL_COMMUNITY): Payer: Self-pay | Admitting: *Deleted

## 2024-07-29 DIAGNOSIS — D696 Thrombocytopenia, unspecified: Secondary | ICD-10-CM | POA: Insufficient documentation

## 2024-07-29 DIAGNOSIS — R319 Hematuria, unspecified: Secondary | ICD-10-CM | POA: Diagnosis present

## 2024-07-29 DIAGNOSIS — R310 Gross hematuria: Secondary | ICD-10-CM | POA: Diagnosis not present

## 2024-07-29 LAB — URINALYSIS, ROUTINE W REFLEX MICROSCOPIC
Bacteria, UA: NONE SEEN
Bilirubin Urine: NEGATIVE
Glucose, UA: >=500 mg/dL — AB
Ketones, ur: NEGATIVE mg/dL
Leukocytes,Ua: NEGATIVE
Nitrite: NEGATIVE
Protein, ur: NEGATIVE mg/dL
RBC / HPF: >50 RBC/hpf (ref 0–5)
Specific Gravity, Urine: 1.024 (ref 1.005–1.030)
pH: 6 (ref 5.0–8.0)

## 2024-07-29 LAB — CBC
HCT: 44.4 % (ref 39.0–52.0)
Hemoglobin: 14.8 g/dL (ref 13.0–17.0)
MCH: 29.2 pg (ref 26.0–34.0)
MCHC: 33.3 g/dL (ref 30.0–36.0)
MCV: 87.7 fL (ref 80.0–100.0)
Platelets: 95 K/uL — ABNORMAL LOW (ref 150–400)
RBC: 5.06 MIL/uL (ref 4.22–5.81)
RDW: 12.7 % (ref 11.5–15.5)
WBC: 2.5 K/uL — ABNORMAL LOW (ref 4.0–10.5)
nRBC: 0 % (ref 0.0–0.2)

## 2024-07-29 LAB — COMPREHENSIVE METABOLIC PANEL WITH GFR
ALT: 21 U/L (ref 0–44)
AST: 35 U/L (ref 15–41)
Albumin: 4.2 g/dL (ref 3.5–5.0)
Alkaline Phosphatase: 82 U/L (ref 38–126)
Anion gap: 11 (ref 5–15)
BUN: 10 mg/dL (ref 8–23)
CO2: 25 mmol/L (ref 22–32)
Calcium: 9 mg/dL (ref 8.9–10.3)
Chloride: 98 mmol/L (ref 98–111)
Creatinine, Ser: 1.02 mg/dL (ref 0.61–1.24)
GFR, Estimated: >60 mL/min (ref >60)
Glucose, Bld: 297 mg/dL — ABNORMAL HIGH (ref 70–99)
Potassium: 3.9 mmol/L (ref 3.5–5.1)
Sodium: 134 mmol/L — ABNORMAL LOW (ref 135–145)
Total Bilirubin: 0.9 mg/dL (ref 0.0–1.2)
Total Protein: 7.2 g/dL (ref 6.5–8.1)

## 2024-07-29 NOTE — ED Provider Notes (Signed)
 [place] EMERGENCY DEPARTMENT AT [facility] Provider Note   CSN: [id number] Arrival date & time: 07/29/24  1425     Patient presents with: No chief complaint on file.   Malik Hill is a 63 y.o. male.   HPI   Patient presents because of hematuria.  Patient states has had hematuria in the past but today it was slightly worse.  Usually has to sit down to use the the bathroom.  Patient states that he sat down to use the bathroom and noticed that he passed a large amount of blood in his urine today.  Patient states that he has had small amounts of blood in his urine in the past but never this large amount.  Patient Dors is a chronic history of dysuria.  No fever no chills.  Endorses chronic fatigue over the past couple weeks.  No abdominal pain.  No flank pain.  No documented fevers at home.  No chest pain or shortness of breath.  Denies all other complaints.  Previous medical history reviewed : Patient followed up with urology on 07/17/24. Urethral stricture. S/p urethral dilation.    Prior to Admission medications   Medication Sig Start Date End Date Taking? Authorizing Provider  acetaminophen  (TYLENOL ) 500 MG tablet Take 1,000 mg by mouth every 6 (six) hours as needed for moderate pain or headache.    [provider]  ALPRAZolam  (XANAX ) 1 MG tablet Take 1 mg by mouth 4 (four) times daily as needed for anxiety. 09/19/21   [provider]  atenolol  (TENORMIN ) 25 MG tablet Take 25 mg by mouth 2 (two) times daily. 03/31/24   [provider]  Atogepant  (QULIPTA ) 60 MG TABS Take 1 tablet (60 mg total) by mouth daily. Patient taking differently: Take 60 mg by mouth daily as needed (migraines). 11/19/23   Penumalli, Vikram R, MD  cetirizine (ZYRTEC) 10 MG tablet Take 10 mg by mouth daily as needed for allergies.    [provider]  fluticasone (FLONASE) 50 MCG/ACT nasal spray Place 2 sprays into both nostrils daily as needed for allergies or  rhinitis.    [provider]  glipiZIDE (GLUCOTROL XL) 10 MG 24 hr tablet Take 10 mg by mouth 2 (two) times daily. 09/18/21   [provider]  hydrochlorothiazide  (HYDRODIURIL ) 12.5 MG tablet Take 12.5 mg by mouth daily. 06/02/24   [provider]  losartan  (COZAAR ) 25 MG tablet Take 25 mg by mouth daily.    [provider]  omeprazole (PRILOSEC OTC) 20 MG tablet Take 20 mg by mouth daily as needed (acid reflux).    [provider]  oxyCODONE -acetaminophen  (PERCOCET/ROXICET) 5-325 MG tablet Take 1 tablet by mouth every 6 (six) hours as needed for severe pain (pain score 7-10). 07/03/24   McKenzie, Belvie CROME, MD  OZEMPIC, 0.25 OR 0.5 MG/DOSE, 2 MG/3ML SOPN Inject 0.5 mg into the skin once a week. Thursdays 06/21/24   [provider]  PARoxetine  (PAXIL ) 40 MG tablet Take 40 mg by mouth daily. 07/28/21   [provider]  QUEtiapine (SEROQUEL) 100 MG tablet Take 100 mg by mouth at bedtime. 07/28/22   [provider]  tamsulosin  (FLOMAX ) 0.4 MG CAPS capsule Take 1 capsule (0.4 mg total) by mouth daily after supper. 07/17/24   McKenzie, Belvie CROME, MD  traZODone  (DESYREL ) 50 MG tablet Take 100 mg by mouth at bedtime. 09/18/21   [provider]  Vitamin D , Ergocalciferol , (DRISDOL ) 1.25 MG (50000 UNIT) CAPS capsule Take 1  capsule (50,000 Units total) by mouth every 7 (seven) days. 10/04/21   Ricky Fines, MD    Allergies: Penicillins    Review of Systems  Constitutional:  Negative for chills and fever.  HENT:  Negative for ear pain and sore throat.   Eyes:  Negative for pain and visual disturbance.  Respiratory:  Negative for cough and shortness of breath.   Cardiovascular:  Negative for chest pain and palpitations.  Gastrointestinal:  Negative for abdominal pain and vomiting.  Genitourinary:  Negative for dysuria and hematuria.  Musculoskeletal:  Negative for arthralgias and back pain.  Skin:  Negative for color change  and rash.  Neurological:  Negative for seizures and syncope.  All other systems reviewed and are negative.   Updated Vital Signs BP 124/81 (BP Location: Right Arm)   Pulse 88   Temp 97.9 F (36.6 C) (Oral)   Resp 18   Ht 6' 5 (1.956 m)   Wt 117.9 kg   SpO2 98%   BMI 30.83 kg/m   Physical Exam Vitals and nursing note reviewed.  Constitutional:      General: He is not in acute distress.    Appearance: He is well-developed.  HENT:     Head: Normocephalic and atraumatic.  Eyes:     Conjunctiva/sclera: Conjunctivae normal.  Cardiovascular:     Rate and Rhythm: Normal rate and regular rhythm.     Heart sounds: No murmur heard. Pulmonary:     Effort: Pulmonary effort is normal. No respiratory distress.     Breath sounds: Normal breath sounds.  Abdominal:     Palpations: Abdomen is soft.     Tenderness: There is no abdominal tenderness.  Musculoskeletal:        General: No swelling.     Cervical back: Neck supple.  Skin:    General: Skin is warm and dry.     Capillary Refill: Capillary refill takes less than 2 seconds.  Neurological:     Mental Status: He is alert.  Psychiatric:        Mood and Affect: Mood normal.     (all labs ordered are listed, but only abnormal results are displayed) Labs Reviewed  URINALYSIS, ROUTINE W REFLEX MICROSCOPIC - Abnormal; Notable for the following components:      Result Value   Glucose, UA >=500 (*)    Hgb urine dipstick LARGE (*)    All other components within normal limits  CBC - Abnormal; Notable for the following components:   WBC 2.5 (*)    Platelets 95 (*)    All other components within normal limits  COMPREHENSIVE METABOLIC PANEL WITH GFR - Abnormal; Notable for the following components:   Sodium 134 (*)    Glucose, Bld 297 (*)    All other components within normal limits    EKG: None  Radiology: No results found.   Procedures   Medications Ordered in the ED - No data to display                                   Medical Decision Making Amount and/or Complexity of Data Reviewed Labs: ordered.     HPI:     Patient presents because of hematuria.  Patient states has had hematuria in the past but today it was slightly worse.  Usually has to sit down to use the the bathroom.  Patient states that he sat down to use  the bathroom and noticed that he passed a large amount of blood in his urine today.  Patient states that he has had small amounts of blood in his urine in the past but never this large amount.  Patient Dors is a chronic history of dysuria.  No fever no chills.  Endorses chronic fatigue over the past couple weeks.  No abdominal pain.  No flank pain.  No documented fevers at home.  No chest pain or shortness of breath.  Denies all other complaints.  Previous medical history reviewed : Patient followed up with urology on 07/17/24. Urethral stricture. S/p urethral dilation.    MDM: \  Upon exam, patient hemodynamically stable.  ANO x 3 GCS 15.  Soft and benign abdomen.  No rebound guarding or tenderness.  No pain to the CVA.  No concerns for nephrolithiasis.  No concerns for pyelonephritis.  This is likely a lower GU bleed likely from bladder or stricture of the urethra has been well-documented.  Plan is to obtain basic laboratory workup.  When assessed make sure there is no evidence of significant anemia from blood loss.  Also obtain postvoid bladder scan to make sure he is emptying his bladder.   Reevaluation:   Reexamination, patient stable.  Patient had a normal postvoid.  Able to empty his bladder completely.   UA did show some hemoglobin.  No evidence of UTI at this time.  No indication starting biotics.  Recommend follow-up with urologist.  Platelet count in the 90s.  Patient has chronic thrombocytopenia.  Recommended follow-up with PCP about this.   Stricture per precautions for any kind of difficulty voiding.   Social Determinant of Health: none    Disposition and  Follow Up: urology       Final diagnoses:  Gross hematuria  Thrombocytopenia    ED Discharge Orders     None          Simon Lavonia SAILOR, MD 07/29/24 [phone]

## 2024-07-29 NOTE — ED Triage Notes (Addendum)
 Pt with scar tissue to urethra , last surgery 2 weeks ago.  Pt with hematuria. Denies any pain

## 2024-07-29 NOTE — Discharge Instructions (Addendum)
 You are able to empty your bladder completely with a postvoid trial.  Your hemoglobin is stable for you.  Your platelets were a little low.  Please follow-up with your primary care physician about this.  Please give your urologist a call.   If you get to the point that you are not able to urinate please come back to the ED.

## 2024-07-31 ENCOUNTER — Ambulatory Visit: Admitting: Urology

## 2024-07-31 ENCOUNTER — Encounter: Payer: Self-pay | Admitting: Urology

## 2024-07-31 VITALS — BP 127/86 | HR 93

## 2024-07-31 DIAGNOSIS — R310 Gross hematuria: Secondary | ICD-10-CM | POA: Diagnosis not present

## 2024-07-31 DIAGNOSIS — R6882 Decreased libido: Secondary | ICD-10-CM

## 2024-07-31 DIAGNOSIS — Z09 Encounter for follow-up examination after completed treatment for conditions other than malignant neoplasm: Secondary | ICD-10-CM

## 2024-07-31 DIAGNOSIS — Z87448 Personal history of other diseases of urinary system: Secondary | ICD-10-CM | POA: Diagnosis not present

## 2024-07-31 DIAGNOSIS — D696 Thrombocytopenia, unspecified: Secondary | ICD-10-CM

## 2024-07-31 LAB — BLADDER SCAN AMB NON-IMAGING: Scan Result: 109

## 2024-07-31 NOTE — Progress Notes (Unsigned)
 07/31/2024 4:41 PM   Reyes Collum 1961/07/29 [id number]  Referring provider: Atilano Deward ORN, MD [address] [place],  [place] [address]  Gross hematuria   HPI: Mr Hohmann is a 63yo here for evaluation of gross hematuria. 07/29/24 he had an episode of gross painless hematuria and presented to the ER. The hematuria resolved prior to getting the the hospital. Platelets were 95. UA today shows 3-10 RBCs. No issues urinating.    PMH: Past Medical History:  Diagnosis Date   Anxiety    BPH (benign prostatic hyperplasia)    Cirrhosis (HCC)    Depression    Diabetes mellitus without complication (HCC)    Headache    History of kidney stones    MDD (major depressive disorder)    Memory loss    Osteoarthritis    Stroke (HCC) 09/2021    Surgical History: Past Surgical History:  Procedure Laterality Date   BIOPSY  06/10/2023   Procedure: BIOPSY;  Surgeon: Cinderella Deatrice FALCON, MD;  [facility]: [facility];  Service: Endoscopy;;   BUNIONECTOMY     COLONOSCOPY  06/09/2007   COLONOSCOPY WITH PROPOFOL  N/A 06/10/2023   Procedure: COLONOSCOPY WITH PROPOFOL ;  Surgeon: Cinderella Deatrice FALCON, MD;  Location: [facility];  Service: Endoscopy;  Laterality: N/A;  9:45am;asa 3   CYSTOSCOPY WITH RETROGRADE PYELOGRAM, URETEROSCOPY AND STENT PLACEMENT Left 01/21/2023   Procedure: CYSTOSCOPY WITH RETROGRADE PYELOGRAM, URETEROSCOPY AND STENT PLACEMENT;  Surgeon: Sherrilee Belvie CROME, MD;  Location: [facility];  Service: Urology;  Laterality: Left;   CYSTOSCOPY WITH RETROGRADE PYELOGRAM, URETEROSCOPY AND STENT PLACEMENT Left 07/01/2023   Procedure: CYSTOSCOPY WITH RETROGRADE PYELOGRAM, URETEROSCOPY AND STENT PLACEMENT;  Surgeon: Sherrilee Belvie CROME, MD;  Location: [facility];  Service: Urology;  Laterality: Left;   CYSTOSCOPY WITH URETHRAL DILATATION N/A 07/03/2024   Procedure: CYSTOSCOPY, WITH URETHRAL DILATION WITH OPTILUME;  Surgeon: Sherrilee Belvie CROME, MD;  Location: [facility];  Service: Urology;  Laterality: N/A;    ESOPHAGOGASTRODUODENOSCOPY (EGD) WITH PROPOFOL  N/A 06/10/2023   Procedure: ESOPHAGOGASTRODUODENOSCOPY (EGD) WITH PROPOFOL ;  Surgeon: Cinderella Deatrice FALCON, MD;  [facility]: [facility];  Service: Endoscopy;  Laterality: N/A;  9:45am;asa 3   HOLMIUM LASER APPLICATION Left 07/01/2023   Procedure: HOLMIUM LASER APPLICATION;  Surgeon: Sherrilee Belvie CROME, MD;  Location: [facility];  Service: Urology;  Laterality: Left;   KNEE SURGERY Left 2015   PENILE ADHESIONS LYSIS     POLYPECTOMY  06/10/2023   Procedure: POLYPECTOMY;  Surgeon: Cinderella Deatrice FALCON, MD;  [facility]: [facility];  Service: Endoscopy;;   TONSILLECTOMY AND ADENOIDECTOMY     age 23    Home Medications:  Allergies as of 07/31/2024       Reactions   Penicillins Rash        Medication List        Accurate as of July 31, 2024  4:41 PM. If you have any questions, ask your nurse or doctor.          acetaminophen  500 MG tablet Commonly known as: TYLENOL  Take 1,000 mg by mouth every 6 (six) hours as needed for moderate pain or headache.   ALPRAZolam  1 MG tablet Commonly known as: XANAX  Take 1 mg by mouth 4 (four) times daily as needed for anxiety.   atenolol  25 MG tablet Commonly known as: TENORMIN  Take 25 mg by mouth 2 (two) times daily.   cetirizine 10 MG tablet Commonly known as: ZYRTEC Take 10 mg by mouth daily as needed for  allergies.   fluticasone 50 MCG/ACT nasal spray Commonly known as: FLONASE Place 2 sprays into both nostrils daily as needed for allergies or rhinitis.   glipiZIDE 10 MG 24 hr tablet Commonly known as: GLUCOTROL XL Take 10 mg by mouth 2 (two) times daily.   hydrochlorothiazide  12.5 MG tablet Commonly known as: HYDRODIURIL  Take 12.5 mg by mouth daily.   losartan  25 MG tablet Commonly known as: COZAAR  Take 25 mg by mouth daily.   omeprazole 20 MG tablet Commonly known as: PRILOSEC OTC Take 20 mg by mouth daily as needed (acid reflux).   oxyCODONE -acetaminophen  5-325 MG  tablet Commonly known as: PERCOCET/ROXICET Take 1 tablet by mouth every 6 (six) hours as needed for severe pain (pain score 7-10).   Ozempic (0.25 or 0.5 MG/DOSE) 2 MG/3ML Sopn Generic drug: Semaglutide(0.25 or 0.5MG /DOS) Inject 0.5 mg into the skin once a week. Thursdays   PARoxetine  40 MG tablet Commonly known as: PAXIL  Take 40 mg by mouth daily.   QUEtiapine 100 MG tablet Commonly known as: SEROQUEL Take 100 mg by mouth at bedtime.   Qulipta  60 MG Tabs Generic drug: Atogepant  Take 1 tablet (60 mg total) by mouth daily. What changed:  when to take this reasons to take this   tamsulosin  0.4 MG Caps capsule Commonly known as: FLOMAX  Take 1 capsule (0.4 mg total) by mouth daily after supper.   traZODone  50 MG tablet Commonly known as: DESYREL  Take 100 mg by mouth at bedtime.   Vitamin D  (Ergocalciferol ) 1.25 MG (50000 UNIT) Caps capsule Commonly known as: DRISDOL  Take 1 capsule (50,000 Units total) by mouth every 7 (seven) days.        Allergies:  Allergies  Allergen Reactions   Penicillins Rash    Family History: Family History  Problem Relation Age of Onset   Hypertension Mother    Hypothyroidism Mother    Heart attack Father    Cancer Sister     Social History:  reports that he has quit smoking. His smoking use included cigarettes. He has never used smokeless tobacco. He reports that he does not drink alcohol and does not use drugs.  ROS: All other review of systems were reviewed and are negative except what is noted above in HPI  Physical Exam: BP 127/86   Pulse 93   Constitutional:  Alert and oriented, No acute distress. HEENT: [place] AT, moist mucus membranes.  Trachea midline, no masses. Cardiovascular: No clubbing, cyanosis, or edema. Respiratory: Normal respiratory effort, no increased work of breathing. GI: Abdomen is soft, nontender, nondistended, no abdominal masses GU: No CVA tenderness.  Lymph: No cervical or inguinal  lymphadenopathy. Skin: No rashes, bruises or suspicious lesions. Neurologic: Grossly intact, no focal deficits, moving all 4 extremities. Psychiatric: Normal mood and affect.  Laboratory Data: Lab Results  Component Value Date   WBC 2.5 (L) 07/29/2024   HGB 14.8 07/29/2024   HCT 44.4 07/29/2024   MCV 87.7 07/29/2024   PLT 95 (L) 07/29/2024    Lab Results  Component Value Date   CREATININE 1.02 07/29/2024    No results found for: PSA  No results found for: TESTOSTERONE  Lab Results  Component Value Date   HGBA1C 7.7 (H) 06/29/2024    Urinalysis    Component Value Date/Time   COLORURINE YELLOW 07/29/2024 1607   APPEARANCEUR CLEAR 07/29/2024 1607   APPEARANCEUR Clear 07/17/2024 1401   LABSPEC 1.024 07/29/2024 1607   PHURINE 6.0 07/29/2024 1607   GLUCOSEU >=500 (A) 07/29/2024 1607  HGBUR LARGE (A) 07/29/2024 1607   BILIRUBINUR NEGATIVE 07/29/2024 1607   BILIRUBINUR Negative 07/17/2024 1401   KETONESUR NEGATIVE 07/29/2024 1607   PROTEINUR NEGATIVE 07/29/2024 1607   UROBILINOGEN 0.2 02/17/2008 1558   NITRITE NEGATIVE 07/29/2024 1607   LEUKOCYTESUR NEGATIVE 07/29/2024 1607    Lab Results  Component Value Date   LABMICR See below: 07/17/2024   WBCUA 0-5 07/17/2024   LABEPIT 0-10 07/17/2024   MUCUS Present (A) 03/03/2024   BACTERIA NONE SEEN 07/29/2024    Pertinent Imaging:  No results found for this or any previous visit.  No results found for this or any previous visit.  No results found for this or any previous visit.  No results found for this or any previous visit.  Results for orders placed during the hospital encounter of 01/19/24  Ultrasound renal complete  Narrative CLINICAL DATA:  nephrolithiasis  EXAM: RENAL / URINARY TRACT ULTRASOUND COMPLETE  COMPARISON:  July 21, 2023  FINDINGS: Right Kidney:  Renal measurements: 12.2 x 7.1 x 7.4 cm = volume: 334 mL. Echogenicity within normal limits. No mass or  hydronephrosis visualized.  Left Kidney:  Renal measurements: 14.9 x 5.8 x 5.5 cm = volume: 249 mL. Echogenicity within normal limits. No mass visualized. There is mild hydronephrosis with increased pelviectasis, increased since prior ultrasound.  Bladder:  Appears normal for degree of bladder distention.  Other:  None.  IMPRESSION: There is mild left hydronephrosis with increased pelviectasis, overall increased since prior ultrasound.  If there is a persistent clinical concern for obstructing nephrolithiasis, dedicated CT abdomen pelvis would be recommended.  These results will be called to the ordering clinician or representative by the Radiologist Assistant, and communication documented in the PACS or [employer].   Electronically Signed By: Corean Salter M.D. On: 02/02/2024 15:09  No results found for this or any previous visit.  Results for orders placed during the hospital encounter of 12/04/22  CT HEMATURIA WORKUP  Narrative CLINICAL DATA:  Gross hematuria * Tracking Code: BO *  EXAM: CT ABDOMEN AND PELVIS WITHOUT AND WITH CONTRAST  TECHNIQUE: Multidetector CT imaging of the abdomen and pelvis was performed following the standard protocol before and following the bolus administration of intravenous contrast.  RADIATION DOSE REDUCTION: This exam was performed according to the departmental dose-optimization program which includes automated exposure control, adjustment of the mA and/or kV according to patient size and/or use of iterative reconstruction technique.  CONTRAST:  OMNIPAQUE  IOHEXOL  350 MG/ML SOLN  COMPARISON:  02/13/2008  FINDINGS: Lower chest: No acute abnormality.  Hepatobiliary: No solid liver abnormality is seen. Coarse, nodular contour of the liver. No gallstones, gallbladder wall thickening, or biliary dilatation.  Pancreas: Unremarkable. No pancreatic ductal dilatation or surrounding inflammatory  changes.  Spleen: Splenomegaly, maximum coronal span 16.5 cm.  Adrenals/Urinary Tract: Adrenal glands are unremarkable. Numerous left renal calculi. Heterogeneous, expansile and faintly calcified appearance of the left renal pelvis filling defect measuring 4.9 x 2.9 x 1.8 cm (series 10, image 64, series 7, image 52). The right kidney is normal, without renal calculi, solid lesion, or hydronephrosis. Mild wall thickening of the urinary bladder.  Stomach/Bowel: Stomach is within normal limits. Diverticulum of the descending duodenum. Appendix appears normal. No evidence of bowel wall thickening, distention, or inflammatory changes. Descending and sigmoid diverticulosis.  Vascular/Lymphatic: No significant vascular findings are present. No enlarged abdominal or pelvic lymph nodes.  Reproductive: No mass or other significant abnormality.  Other: No abdominal wall hernia or abnormality. No ascites.  Musculoskeletal:  No acute or significant osseous findings.  IMPRESSION: 1. Numerous left renal calculi. 2. Heterogeneous, expansile and faintly calcified appearance of the left renal pelvis filling defect measuring 4.9 x 2.9 x 1.8 cm. This may reflect a large, partially calcified staghorn calculus but appearance is somewhat concerning for urothelial malignancy. 3. No evidence of lymphadenopathy or metastatic disease in the abdomen or pelvis. 4. Mild wall thickening of the urinary bladder, likely related to chronic outlet obstruction 5. Cirrhotic morphology of the liver.  Splenomegaly. 6. Descending and sigmoid diverticulosis without evidence of acute diverticulitis.  These results will be called to the ordering clinician or representative by the Radiologist Assistant, and communication documented in the PACS or [employer].   Electronically Signed By: Marolyn JONETTA Jaksch M.D. On: 12/05/2022 17:04  Results for orders placed in visit on 02/08/24  CT RENAL STONE  STUDY  Narrative CLINICAL DATA:  Abdominal pain/flank pain  EXAM: CT ABDOMEN AND PELVIS WITHOUT CONTRAST  TECHNIQUE: Multidetector CT imaging of the abdomen and pelvis was performed following the standard protocol without IV contrast.  RADIATION DOSE REDUCTION: This exam was performed according to the departmental dose-optimization program which includes automated exposure control, adjustment of the mA and/or kV according to patient size and/or use of iterative reconstruction technique.  COMPARISON:  Renal ultrasound performed January 19, 2024. CT of the abdomen and pelvis performed May 03, 2023  FINDINGS: Lower chest: No acute abnormality.  Hepatobiliary: Cirrhotic morphology of the liver. No gallstones, gallbladder wall thickening, or biliary dilatation.  Pancreas: Unremarkable. No pancreatic ductal dilatation or surrounding inflammatory changes.  Spleen: Enlarged spleen, similar.  Adrenals/Urinary Tract: The adrenal glands are within normal limits. There is mild prominence of the left renal collecting system. Prominent left renal pelvis. 4 mm stone in the midpole left kidney. Urinary bladder is partially decompressed.  Stomach/Bowel: No dilated loops of bowel. The appendix is well seen and within normal limits. Scattered colonic diverticula.  Vascular/Lymphatic: Nothing significant.  Reproductive: Prostate is unremarkable.  Other: Nothing significant.  Musculoskeletal: Mild degenerative changes in the lumbar spine.  IMPRESSION: 1. Left-sided nephrolithiasis. 2. Prominent renal pelvis without radiopaque calculi identified within the left renal pelvis or left ureter.   Electronically Signed By: Maude Naegeli M.D. On: 02/27/2024 09:25   Assessment & Plan:    1. Gross hematuria (Primary) -resolved - Urinalysis, Routine w reflex microscopic - BLADDER SCAN AMB NON-IMAGING  2. Referral to Hem Onc due to low platelets   No follow-ups on file.  Belvie Clara, MD  [facility] [place]

## 2024-07-31 NOTE — Progress Notes (Unsigned)
   Patient can void prior to the bladder scan. Bladder scan result: 109  Performed By: [facility] LPN

## 2024-07-31 NOTE — Patient Instructions (Signed)
 Blood in the Pee (Hematuria) in Adults: What to Know  Hematuria is blood in the pee. You may be able to see blood in the pee. In some cases, a health care provider may find blood with a test.  Blood in the pee can be caused by infections of the kidney, bladder, or the urethra. The urethra is the tube that drains pee from the bladder.  Other causes may include: Kidney stones. Infection of the prostate. Cancer. Too much calcium in the pee. Conditions that are passed from parent to child. Too much exercise. Infections can be treated with medicine. A kidney stone will usually leave your body when you pee. If infections or kidney stones didn't cause the blood in the urine, then more tests may be needed. It is very important to tell your provider about any blood in your pee, even if you have no pain or the blood stops with no treatment. Blood in the pee can be a sign of a very serious problem, such as cancer. Follow these instructions at home: Medicines Take your medicines only as told. If you were given antibiotics, take them as told. Do not stop taking them even if you start to feel better. Eating and drinking Drink more fluids as told. Aim to drink 3-4 quarts (2.8-3.8 L) a day. Avoid caffeine, tea, and carbonated drinks. These can bother the bladder. Avoid alcohol if a male because it may irritate the prostate. General instructions If you have been diagnosed with a kidney stone, strain your pee to catch the stone if told by your provider. Empty your bladder often. Avoid holding pee for a long time. If you're male, make sure that: You wipe from front to back after using the bathroom. You use each piece of toilet paper only once. You pee before and after sex. It's up to you to get the results of any tests. Ask when your results will be ready and how to get them. You may need to call or meet with your provider to get your results. Keep all follow-up visits. Your provider will need to know  about any changes or any new symptoms. Contact a health care provider if: Your symptoms don't get better after 3 days. Your symptoms get worse. You have back pain or belly pain. You have a fever or chills. You throw up or feel like you may throw up. You throw up every time you take medicine. Get help right away if: You pass blood clots in your pee. You pass out. These symptoms may be an emergency. Call 911 right away. Do not wait to see if the symptoms will go away. Do not drive yourself to the hospital. This information is not intended to replace advice given to you by your health care provider. Make sure you discuss any questions you have with your health care provider. Document Revised: 07/08/2023 Document Reviewed: 06/17/2023 Elsevier Patient Education  2024 [employer].

## 2024-08-01 ENCOUNTER — Other Ambulatory Visit

## 2024-08-01 DIAGNOSIS — R6882 Decreased libido: Secondary | ICD-10-CM

## 2024-08-01 LAB — URINALYSIS, ROUTINE W REFLEX MICROSCOPIC
Bilirubin, UA: NEGATIVE
Ketones, UA: NEGATIVE
Leukocytes,UA: NEGATIVE
Nitrite, UA: NEGATIVE
Protein,UA: NEGATIVE
Specific Gravity, UA: 1.01 (ref 1.005–1.030)
Urobilinogen, Ur: 1 mg/dL (ref 0.2–1.0)
pH, UA: 6 (ref 5.0–7.5)

## 2024-08-01 LAB — MICROSCOPIC EXAMINATION: Bacteria, UA: NONE SEEN

## 2024-08-02 LAB — TESTOSTERONE,FREE AND TOTAL
Testosterone, Free: 11 pg/mL (ref 6.6–18.1)
Testosterone: 344 ng/dL (ref 264–916)

## 2024-08-04 ENCOUNTER — Encounter (HOSPITAL_COMMUNITY): Payer: Self-pay | Admitting: Emergency Medicine

## 2024-08-04 ENCOUNTER — Emergency Department (HOSPITAL_COMMUNITY)
Admission: EM | Admit: 2024-08-04 | Discharge: 2024-08-04 | Disposition: A | Discharge status: Home / Self Care | Attending: Emergency Medicine | Admitting: Emergency Medicine

## 2024-08-04 ENCOUNTER — Encounter

## 2024-08-04 ENCOUNTER — Encounter: Payer: Self-pay | Admitting: Urology

## 2024-08-04 ENCOUNTER — Other Ambulatory Visit: Payer: Self-pay

## 2024-08-04 DIAGNOSIS — R300 Dysuria: Secondary | ICD-10-CM | POA: Diagnosis present

## 2024-08-04 DIAGNOSIS — E119 Type 2 diabetes mellitus without complications: Secondary | ICD-10-CM | POA: Diagnosis not present

## 2024-08-04 DIAGNOSIS — Z8673 Personal history of transient ischemic attack (TIA), and cerebral infarction without residual deficits: Secondary | ICD-10-CM | POA: Insufficient documentation

## 2024-08-04 DIAGNOSIS — Z7984 Long term (current) use of oral hypoglycemic drugs: Secondary | ICD-10-CM | POA: Insufficient documentation

## 2024-08-04 DIAGNOSIS — R319 Hematuria, unspecified: Secondary | ICD-10-CM | POA: Diagnosis not present

## 2024-08-04 DIAGNOSIS — Z87891 Personal history of nicotine dependence: Secondary | ICD-10-CM | POA: Diagnosis not present

## 2024-08-04 LAB — URINALYSIS, W/ REFLEX TO CULTURE (INFECTION SUSPECTED)
Bacteria, UA: NONE SEEN
Bilirubin Urine: NEGATIVE
Glucose, UA: >=500 mg/dL — AB
Ketones, ur: NEGATIVE mg/dL
Leukocytes,Ua: NEGATIVE
Nitrite: NEGATIVE
Protein, ur: NEGATIVE mg/dL
Specific Gravity, Urine: 1.026 (ref 1.005–1.030)
pH: 5 (ref 5.0–8.0)

## 2024-08-04 LAB — CBC WITH DIFFERENTIAL/PLATELET
Abs Immature Granulocytes: 0.01 K/uL (ref 0.00–0.07)
Basophils Absolute: 0 K/uL (ref 0.0–0.1)
Basophils Relative: 0 %
Eosinophils Absolute: 0.1 K/uL (ref 0.0–0.5)
Eosinophils Relative: 2 %
HCT: 41 % (ref 39.0–52.0)
Hemoglobin: 13.8 g/dL (ref 13.0–17.0)
Immature Granulocytes: 0 %
Lymphocytes Relative: 38 %
Lymphs Abs: 1.3 K/uL (ref 0.7–4.0)
MCH: 29.2 pg (ref 26.0–34.0)
MCHC: 33.7 g/dL (ref 30.0–36.0)
MCV: 86.9 fL (ref 80.0–100.0)
Monocytes Absolute: 0.4 K/uL (ref 0.1–1.0)
Monocytes Relative: 10 %
Neutro Abs: 1.7 K/uL (ref 1.7–7.7)
Neutrophils Relative %: 50 %
Platelets: 103 K/uL — ABNORMAL LOW (ref 150–400)
RBC: 4.72 MIL/uL (ref 4.22–5.81)
RDW: 12.4 % (ref 11.5–15.5)
WBC: 3.4 K/uL — ABNORMAL LOW (ref 4.0–10.5)
nRBC: 0 % (ref 0.0–0.2)

## 2024-08-04 LAB — BASIC METABOLIC PANEL WITH GFR
Anion gap: 11 (ref 5–15)
BUN: 13 mg/dL (ref 8–23)
CO2: 24 mmol/L (ref 22–32)
Calcium: 8.7 mg/dL — ABNORMAL LOW (ref 8.9–10.3)
Chloride: 101 mmol/L (ref 98–111)
Creatinine, Ser: 0.98 mg/dL (ref 0.61–1.24)
GFR, Estimated: >60 mL/min (ref >60)
Glucose, Bld: 193 mg/dL — ABNORMAL HIGH (ref 70–99)
Potassium: 4 mmol/L (ref 3.5–5.1)
Sodium: 136 mmol/L (ref 135–145)

## 2024-08-04 NOTE — ED Triage Notes (Signed)
 Pt to the ED with Penile bleeding after surgery earlier in the month.  Denies pain.

## 2024-08-04 NOTE — Discharge Instructions (Signed)
 We evaluated you for your blood in your urine. Your testing was reassuring. We didn't see signs of inability to empty your bladder or signs of a urine infection. Please follow up with Dr. Sherrilee for further care.

## 2024-08-04 NOTE — ED Provider Notes (Signed)
 [place] EMERGENCY DEPARTMENT AT [facility] Provider Note  CSN: [id number] Arrival date & time: 08/04/24 1451  Chief Complaint(s) Penile Bleeding  HPI Malik Hill is a 63 y.o. male history of BPH, cirrhosis, diabetes, prior stroke presenting to the emergency department with urethral bleeding.  Patient reports he urinated this morning and then had an episode of bleeding from his urethra.  He reports chronic unchanged dysuria.  No fevers or chills.  No back pain or flank pain.  No abdominal pain.  No fevers or chills.  Was seen in the ER recently for this, saw his urologist earlier this week for the same.  Reports sometimes has to struggle to urinate but feels he is emptying his bladder.  No fainting or syncope.  Denies blood thinner use.   Past Medical History Past Medical History:  Diagnosis Date   Anxiety    BPH (benign prostatic hyperplasia)    Cirrhosis (HCC)    Depression    Diabetes mellitus without complication (HCC)    Headache    History of kidney stones    MDD (major depressive disorder)    Memory loss    Osteoarthritis    Stroke (HCC) 09/2021   Patient Active Problem List   Diagnosis Date Noted   Gastric polyp 06/10/2023   Gastritis and gastroduodenitis 06/10/2023   Adenomatous polyp of transverse colon 06/10/2023   Portal hypertension (HCC) 05/18/2023   BMI 32.0-32.9,adult 05/18/2023   Cirrhosis of liver without ascites (HCC) 05/18/2023   Colon cancer screening 05/18/2023   Nephrolithiasis 01/21/2023   Weakness    Bronchitis    SIRS (systemic inflammatory response syndrome) (HCC) 09/25/2021   DM (diabetes mellitus) (HCC) 09/25/2021   Essential hypertension 09/25/2021   Depression 09/25/2021   Home Medication(s) Prior to Admission medications   Medication Sig Start Date End Date Taking? Authorizing Provider  acetaminophen  (TYLENOL ) 500 MG tablet Take 1,000 mg by mouth every 6 (six) hours as needed for moderate pain or headache.    [provider]  ALPRAZolam  (XANAX ) 1 MG tablet Take 1 mg by mouth 4 (four) times daily as needed for anxiety. 09/19/21   [provider]  atenolol  (TENORMIN ) 25 MG tablet Take 25 mg by mouth 2 (two) times daily. 03/31/24   [provider]  Atogepant  (QULIPTA ) 60 MG TABS Take 1 tablet (60 mg total) by mouth daily. Patient taking differently: Take 60 mg by mouth daily as needed (migraines). 11/19/23   Penumalli, Vikram R, MD  cetirizine (ZYRTEC) 10 MG tablet Take 10 mg by mouth daily as needed for allergies.    [provider]  fluticasone (FLONASE) 50 MCG/ACT nasal spray Place 2 sprays into both nostrils daily as needed for allergies or rhinitis.    [provider]  glipiZIDE (GLUCOTROL XL) 10 MG 24 hr tablet Take 10 mg by mouth 2 (two) times daily. 09/18/21   [provider]  hydrochlorothiazide  (HYDRODIURIL ) 12.5 MG tablet Take 12.5 mg by mouth daily. 06/02/24   [provider]  losartan  (COZAAR ) 25 MG tablet Take 25 mg by mouth daily.    [provider]  omeprazole (PRILOSEC OTC) 20 MG tablet Take 20 mg by mouth daily as needed (acid reflux).    [provider]  oxyCODONE -acetaminophen  (PERCOCET/ROXICET) 5-325 MG tablet Take 1 tablet by mouth every 6 (six) hours as needed for severe pain (pain score 7-10). 07/03/24   McKenzie, Belvie CROME, MD  OZEMPIC, 0.25 OR 0.5 MG/DOSE, 2 MG/3ML SOPN Inject 0.5 mg  into the skin once a week. Thursdays 06/21/24   [provider]  PARoxetine  (PAXIL ) 40 MG tablet Take 40 mg by mouth daily. 07/28/21   [provider]  QUEtiapine (SEROQUEL) 100 MG tablet Take 100 mg by mouth at bedtime. 07/28/22   [provider]  tamsulosin  (FLOMAX ) 0.4 MG CAPS capsule Take 1 capsule (0.4 mg total) by mouth daily after supper. 07/17/24   McKenzie, Belvie CROME, MD  traZODone  (DESYREL ) 50 MG tablet Take 100 mg by mouth at bedtime. 09/18/21   [provider]  Vitamin D , Ergocalciferol ,  (DRISDOL ) 1.25 MG (50000 UNIT) CAPS capsule Take 1 capsule (50,000 Units total) by mouth every 7 (seven) days. 10/04/21   Ricky Fines, MD                                                                                                                                    Past Surgical History Past Surgical History:  Procedure Laterality Date   BIOPSY  06/10/2023   Procedure: BIOPSY;  Surgeon: Cinderella Deatrice FALCON, MD;  Location: [facility];  Service: Endoscopy;;   BUNIONECTOMY     COLONOSCOPY  06/09/2007   COLONOSCOPY WITH PROPOFOL  N/A 06/10/2023   Procedure: COLONOSCOPY WITH PROPOFOL ;  Surgeon: Cinderella Deatrice FALCON, MD;  Location: [facility];  Service: Endoscopy;  Laterality: N/A;  9:45am;asa 3   CYSTOSCOPY WITH RETROGRADE PYELOGRAM, URETEROSCOPY AND STENT PLACEMENT Left 01/21/2023   Procedure: CYSTOSCOPY WITH RETROGRADE PYELOGRAM, URETEROSCOPY AND STENT PLACEMENT;  Surgeon: Sherrilee Belvie CROME, MD;  Location: [facility];  Service: Urology;  Laterality: Left;   CYSTOSCOPY WITH RETROGRADE PYELOGRAM, URETEROSCOPY AND STENT PLACEMENT Left 07/01/2023   Procedure: CYSTOSCOPY WITH RETROGRADE PYELOGRAM, URETEROSCOPY AND STENT PLACEMENT;  Surgeon: Sherrilee Belvie CROME, MD;  Location: [facility];  Service: Urology;  Laterality: Left;   CYSTOSCOPY WITH URETHRAL DILATATION N/A 07/03/2024   Procedure: CYSTOSCOPY, WITH URETHRAL DILATION WITH OPTILUME;  Surgeon: Sherrilee Belvie CROME, MD;  Location: [facility];  Service: Urology;  Laterality: N/A;   ESOPHAGOGASTRODUODENOSCOPY (EGD) WITH PROPOFOL  N/A 06/10/2023   Procedure: ESOPHAGOGASTRODUODENOSCOPY (EGD) WITH PROPOFOL ;  Surgeon: Cinderella Deatrice FALCON, MD;  [facility]: [facility];  Service: Endoscopy;  Laterality: N/A;  9:45am;asa 3   HOLMIUM LASER APPLICATION Left 07/01/2023   Procedure: HOLMIUM LASER APPLICATION;  Surgeon: Sherrilee Belvie CROME, MD;  Location: [facility];  Service: Urology;  Laterality: Left;   KNEE SURGERY Left 2015   PENILE ADHESIONS LYSIS     POLYPECTOMY  06/10/2023    Procedure: POLYPECTOMY;  Surgeon: Cinderella Deatrice FALCON, MD;  Location: [facility];  Service: Endoscopy;;   TONSILLECTOMY AND ADENOIDECTOMY     age 22   Family History Family History  Problem Relation Age of Onset   Hypertension Mother    Hypothyroidism Mother    Heart attack Father    Cancer Sister     Social History Social History   Tobacco Use  Smoking status: Former    Types: Cigarettes   Smokeless tobacco: Never  Vaping Use   Vaping status: Never Used  Substance Use Topics   Alcohol use: Never   Drug use: No   Allergies Penicillins  Review of Systems Review of Systems  All other systems reviewed and are negative.   Physical Exam Vital Signs  I have reviewed the triage vital signs BP 123/87   Pulse 88   Temp 98.2 F (36.8 C) (Oral)   Resp 16   Ht 6' 5 (1.956 m)   Wt 117.9 kg   SpO2 94%   BMI 30.83 kg/m  Physical Exam Vitals and nursing note reviewed.  Constitutional:      General: He is not in acute distress.    Appearance: Normal appearance.  HENT:     Head: Normocephalic and atraumatic.     Mouth/Throat:     Mouth: Mucous membranes are moist.  Eyes:     Conjunctiva/sclera: Conjunctivae normal.  Cardiovascular:     Rate and Rhythm: Normal rate and regular rhythm.  Pulmonary:     Effort: Pulmonary effort is normal. No respiratory distress.     Breath sounds: Normal breath sounds.  Abdominal:     General: Abdomen is flat.     Palpations: Abdomen is soft.     Tenderness: There is no abdominal tenderness. There is no right CVA tenderness or left CVA tenderness.  Musculoskeletal:     Right lower leg: No edema.     Left lower leg: No edema.  Skin:    General: Skin is warm and dry.     Capillary Refill: Capillary refill takes less than 2 seconds.  Neurological:     General: No focal deficit present.     Mental Status: He is alert and oriented to person, place, and time. Mental status is at baseline.  Psychiatric:        Mood and Affect: Mood  normal.        Behavior: Behavior normal.     ED Results and Treatments Labs (all labs ordered are listed, but only abnormal results are displayed) Labs Reviewed  BASIC METABOLIC PANEL WITH GFR - Abnormal; Notable for the following components:      Result Value   Glucose, Bld 193 (*)    Calcium  8.7 (*)    All other components within normal limits  CBC WITH DIFFERENTIAL/PLATELET - Abnormal; Notable for the following components:   WBC 3.4 (*)    Platelets 103 (*)    All other components within normal limits  URINALYSIS, W/ REFLEX TO CULTURE (INFECTION SUSPECTED) - Abnormal; Notable for the following components:   Glucose, UA >=500 (*)    Hgb urine dipstick MODERATE (*)    All other components within normal limits                                                                                                                          Radiology No  results found.  Pertinent labs & imaging results that were available during my care of the patient were reviewed by me and considered in my medical decision making (see MDM for details).  Medications Ordered in ED Medications - No data to display                                                                                                                                   Procedures Procedures  (including critical care time)  Medical Decision Making / ED Course   MDM:  63 year old presenting to the emergency department with episode of urethral bleeding.  Patient overall well-appearing, examination is overall unremarkable.  Will check labs including CBC, BMP.  Will check urinalysis.  Will check postvoid residual.  Overall concern for dangerous process is low.  Seems to be more of a chronic issue.  Lower concern for UTI, pyelonephritis, nephrolithiasis, anemia.  Will reassess.  If labs are reassuring anticipate discharge with continued urology outpatient follow-up.  Clinical Course as of 08/04/24 1827  Fri Aug 04, 2024  1826 PVR  is zero. Labs reassuring. Urinalysis with some hematuria but no signs of UTI. Will discharge patient to home. All questions answered. Patient comfortable with plan of discharge. Return precautions discussed with patient and specified on the after visit summary.  [WS]    Clinical Course User Index [WS] Francesca Elsie CROME, MD     Additional history obtained: -Additional history obtained from spouse -External records from outside source obtained and reviewed including: Chart review including previous notes, labs, imaging, consultation notes including prior urology and ER notes    Lab Tests: -I ordered, reviewed, and interpreted labs.   The pertinent results include:   Labs Reviewed  BASIC METABOLIC PANEL WITH GFR - Abnormal; Notable for the following components:      Result Value   Glucose, Bld 193 (*)    Calcium  8.7 (*)    All other components within normal limits  CBC WITH DIFFERENTIAL/PLATELET - Abnormal; Notable for the following components:   WBC 3.4 (*)    Platelets 103 (*)    All other components within normal limits  URINALYSIS, W/ REFLEX TO CULTURE (INFECTION SUSPECTED) - Abnormal; Notable for the following components:   Glucose, UA >=500 (*)    Hgb urine dipstick MODERATE (*)    All other components within normal limits    Notable for mild hyperglycemia, hematuria   Medicines ordered and prescription drug management: No orders of the defined types were placed in this encounter.   -I have reviewed the patients home medicines and have made adjustments as needed  Social Determinants of Health:  Diagnosis or treatment significantly limited by social determinants of health: obesity   Co morbidities that complicate the patient evaluation  Past Medical History:  Diagnosis Date   Anxiety    BPH (benign prostatic hyperplasia)    Cirrhosis (HCC)    Depression    Diabetes  mellitus without complication (HCC)    Headache    History of kidney stones    MDD (major  depressive disorder)    Memory loss    Osteoarthritis    Stroke [facility]) 09/2021      Dispostion: Disposition decision including need for hospitalization was considered, and patient discharged from emergency department.    Final Clinical Impression(s) / ED Diagnoses Final diagnoses:  Hematuria, unspecified type     This chart was dictated using voice recognition software.  Despite best efforts to proofread,  errors can occur which can change the documentation meaning.    Francesca Elsie CROME, MD 08/04/24 [phone]

## 2024-08-07 ENCOUNTER — Telehealth: Payer: Self-pay

## 2024-08-07 NOTE — Telephone Encounter (Signed)
 fyi

## 2024-08-07 NOTE — Telephone Encounter (Signed)
 Patient left a voice message 08-07-2024.  Patient still has bleeding due to recent cysto since being seen at hospital.  Please advise.  Call: [phone]

## 2024-08-07 NOTE — Telephone Encounter (Signed)
 Pt states he just wanted to call us  and let us  know he went to the Ed and his pallets are low pt states he has not seen any blood or had any pain since Saturday 11/01 pt notified that MD did put in the referral for his low platelet

## 2024-08-08 ENCOUNTER — Ambulatory Visit: Payer: Self-pay

## 2024-08-11 ENCOUNTER — Telehealth: Payer: Self-pay

## 2024-08-11 ENCOUNTER — Ambulatory Visit: Admitting: Urology

## 2024-08-11 VITALS — BP 114/70 | HR 102

## 2024-08-11 DIAGNOSIS — R310 Gross hematuria: Secondary | ICD-10-CM | POA: Diagnosis not present

## 2024-08-11 DIAGNOSIS — R319 Hematuria, unspecified: Secondary | ICD-10-CM | POA: Diagnosis not present

## 2024-08-11 LAB — URINALYSIS, ROUTINE W REFLEX MICROSCOPIC
Bilirubin, UA: NEGATIVE
Ketones, UA: NEGATIVE
Leukocytes,UA: NEGATIVE
Nitrite, UA: NEGATIVE
Protein,UA: NEGATIVE
RBC, UA: NEGATIVE
Specific Gravity, UA: 1.015 (ref 1.005–1.030)
Urobilinogen, Ur: 1 mg/dL (ref 0.2–1.0)
pH, UA: 6 (ref 5.0–7.5)

## 2024-08-11 MED ORDER — CIPROFLOXACIN HCL 500 MG PO TABS
500.0000 mg | ORAL_TABLET | Freq: Once | ORAL | Status: AC
  Administered 2024-08-11: 500 mg via ORAL

## 2024-08-11 MED ORDER — DOXYCYCLINE HYCLATE 100 MG PO CAPS: 100.0000 mg | ORAL_CAPSULE | Freq: Two times a day (BID) | ORAL | 0 refills | Status: AC

## 2024-08-11 NOTE — Progress Notes (Signed)
   08/11/24  CC: hematuria   HPI: Malik Hill is a 63yo here for cystoscopy for hematuria Blood pressure 114/70, pulse (!) 102. NED. A&Ox3.   No respiratory distress   Abd soft, NT, ND Normal phallus with bilateral descended testicles  Cystoscopy Procedure Note  Patient identification was confirmed, informed consent was obtained, and patient was prepped using Betadine solution.  Lidocaine  jelly was administered per urethral meatus.     Pre-Procedure: - Inspection reveals a normal caliber ureteral meatus.  Procedure: The flexible cystoscope was introduced without difficulty - bulbar urethral stricture present - Enlarged prostate erythematous - Normal bladder neck - Bilateral ureteral orifices identified - Bladder mucosa  reveals no ulcers, tumors, or lesions - No bladder stones - No trabeculation     Post-Procedure: - Patient tolerated the procedure well  Assessment/ Plan: Doxycycline  100mg  BID for 28 days  No follow-ups on file.  Belvie Clara, MD

## 2024-08-11 NOTE — Telephone Encounter (Signed)
 Patient has not heard from   AMB REFERRAL TO HEMATOLOGY / ONCOLOGY   Asking which location is the office, [facility] or [facility]?  Please advise.  Call:  [phone] BENNIE)

## 2024-08-14 NOTE — Telephone Encounter (Signed)
 Called pt and lvm to let him know referral was put in for [place] office and left number to c/b if needed

## 2024-08-15 ENCOUNTER — Inpatient Hospital Stay: Attending: Internal Medicine | Admitting: Internal Medicine

## 2024-08-15 ENCOUNTER — Encounter: Payer: Self-pay | Admitting: Urology

## 2024-08-15 ENCOUNTER — Inpatient Hospital Stay

## 2024-08-15 ENCOUNTER — Encounter: Payer: Self-pay | Admitting: Internal Medicine

## 2024-08-15 VITALS — BP 119/84 | HR 88 | Temp 96.8°F | Resp 98 | Ht 77.0 in | Wt 267.5 lb

## 2024-08-15 DIAGNOSIS — I10 Essential (primary) hypertension: Secondary | ICD-10-CM | POA: Diagnosis not present

## 2024-08-15 DIAGNOSIS — E876 Hypokalemia: Secondary | ICD-10-CM | POA: Diagnosis not present

## 2024-08-15 DIAGNOSIS — D696 Thrombocytopenia, unspecified: Secondary | ICD-10-CM | POA: Insufficient documentation

## 2024-08-15 DIAGNOSIS — K760 Fatty (change of) liver, not elsewhere classified: Secondary | ICD-10-CM | POA: Diagnosis not present

## 2024-08-15 DIAGNOSIS — E871 Hypo-osmolality and hyponatremia: Secondary | ICD-10-CM | POA: Diagnosis not present

## 2024-08-15 DIAGNOSIS — E559 Vitamin D deficiency, unspecified: Secondary | ICD-10-CM | POA: Diagnosis not present

## 2024-08-15 DIAGNOSIS — F419 Anxiety disorder, unspecified: Secondary | ICD-10-CM | POA: Diagnosis not present

## 2024-08-15 DIAGNOSIS — Z87891 Personal history of nicotine dependence: Secondary | ICD-10-CM | POA: Insufficient documentation

## 2024-08-15 DIAGNOSIS — E1165 Type 2 diabetes mellitus with hyperglycemia: Secondary | ICD-10-CM | POA: Diagnosis not present

## 2024-08-15 DIAGNOSIS — E7849 Other hyperlipidemia: Secondary | ICD-10-CM | POA: Diagnosis not present

## 2024-08-15 NOTE — Progress Notes (Signed)
 Patient states he is anxious regarding his referral for today.

## 2024-08-15 NOTE — Progress Notes (Signed)
 [facility] CONSULT NOTE  Patient Care Team: Practice, [facility] as PCP - General Rourk, Lamar HERO, MD as Consulting Physician (Gastroenterology) Rennie Cindy SAUNDERS, MD as Consulting Physician (Oncology)  CHIEF COMPLAINTS/PURPOSE OF CONSULTATION: Thrombocytopenia  HISTORY OF PRESENTING ILLNESS:  Malik Hill 63 y.o.  malereferred to us  for further evaluation of thrombocytopenia.  Discussed the use of AI scribe software for clinical note transcription with the patient, who gave verbal consent to proceed.  History of Present Illness   Malik Hill is a 63 year old male who presents for evaluation of thrombocytopenia. He was referred by Dr. Sherrilee for evaluation of thrombocytopenia.  Thrombocytopenia was initially discovered by his urologist during a procedure to trim scar tissue in his urinary tract. His platelet counts have been consistently low since at least 2022, with values ranging from 80 to 128. He does not experience epistaxis or gum bleeding but reports significant fatigue.  He has a history of urinary issues and underwent a procedure involving a balloon with chemotherapy to address scar tissue. Despite this intervention, he continues to experience bleeding, and his urologist identified three spots in his bladder, though malignancy is not suspected.  He has a history of cirrhosis, diagnosed following a colonoscopy and endoscopy. He does not consume alcohol and has been told he has cirrhosis. His sister died of colon cancer 18 years ago, and he had not had a colonoscopy in years until recently.  He experienced a mild stroke in 2022, after which he has not fully regained his energy levels. He is on several medications, including Seroquel and trazodone  for anxiety and psychiatric issues. He also takes Qulipta  for migraines, though he finds Tylenol  equally effective.  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 He does not report any leg swelling and has a patch of psoriasis on his throat.      Patient denies any easy bruising or bleeding.  No gum bleeds or nosebleeds. Review of Systems  Constitutional:  Positive for malaise/fatigue. Negative for chills, diaphoresis, fever and weight loss.  HENT:  Negative for nosebleeds and sore throat.   Eyes:  Negative for double vision.  Respiratory:  Negative for cough, hemoptysis, sputum production, shortness of breath and wheezing.   Cardiovascular:  Negative for chest pain, palpitations, orthopnea and leg swelling.  Gastrointestinal:  Negative for abdominal pain, blood in stool, constipation, diarrhea, heartburn, melena, nausea and vomiting.  Genitourinary:  Negative for dysuria, frequency and urgency.  Musculoskeletal:  Negative for back pain and joint pain.  Skin: Negative.  Negative for itching and rash.  Neurological:  Negative for dizziness, tingling, focal weakness, weakness and headaches.  Endo/Heme/Allergies:  Does not bruise/bleed easily.  Psychiatric/Behavioral:  Negative for depression. The patient is not nervous/anxious and does not have insomnia.      MEDICAL HISTORY:  Past Medical History:  Diagnosis Date   Anxiety    BPH (benign prostatic hyperplasia)    Cirrhosis (HCC)    Depression    Diabetes mellitus without complication (HCC)    Headache    History of kidney stones    MDD (major depressive disorder)    Memory loss    Osteoarthritis    Stroke (HCC) 09/2021    SURGICAL HISTORY: Past Surgical History:  Procedure Laterality Date   BIOPSY  06/10/2023   Procedure: BIOPSY;  Surgeon: Cinderella Deatrice FALCON, MD;  [facility]: [facility];  Service: Endoscopy;;  BUNIONECTOMY     COLONOSCOPY  06/09/2007   COLONOSCOPY WITH PROPOFOL  N/A 06/10/2023   Procedure: COLONOSCOPY WITH PROPOFOL ;  Surgeon: Cinderella Deatrice FALCON, MD;  Location: [facility];  Service: Endoscopy;  Laterality: N/A;  9:45am;asa 3    CYSTOSCOPY WITH RETROGRADE PYELOGRAM, URETEROSCOPY AND STENT PLACEMENT Left 01/21/2023   Procedure: CYSTOSCOPY WITH RETROGRADE PYELOGRAM, URETEROSCOPY AND STENT PLACEMENT;  Surgeon: Sherrilee Belvie CROME, MD;  Location: [facility];  Service: Urology;  Laterality: Left;   CYSTOSCOPY WITH RETROGRADE PYELOGRAM, URETEROSCOPY AND STENT PLACEMENT Left 07/01/2023   Procedure: CYSTOSCOPY WITH RETROGRADE PYELOGRAM, URETEROSCOPY AND STENT PLACEMENT;  Surgeon: Sherrilee Belvie CROME, MD;  Location: [facility];  Service: Urology;  Laterality: Left;   CYSTOSCOPY WITH URETHRAL DILATATION N/A 07/03/2024   Procedure: CYSTOSCOPY, WITH URETHRAL DILATION WITH OPTILUME;  Surgeon: Sherrilee Belvie CROME, MD;  Location: [facility];  Service: Urology;  Laterality: N/A;   ESOPHAGOGASTRODUODENOSCOPY (EGD) WITH PROPOFOL  N/A 06/10/2023   Procedure: ESOPHAGOGASTRODUODENOSCOPY (EGD) WITH PROPOFOL ;  Surgeon: Cinderella Deatrice FALCON, MD;  [facility];  Service: Endoscopy;  Laterality: N/A;  9:45am;asa 3   HOLMIUM LASER APPLICATION Left 07/01/2023   Procedure: HOLMIUM LASER APPLICATION;  Surgeon: Sherrilee Belvie CROME, MD;  Location: [facility];  Service: Urology;  Laterality: Left;   KNEE SURGERY Left 2015   PENILE ADHESIONS LYSIS     POLYPECTOMY  06/10/2023   Procedure: POLYPECTOMY;  Surgeon: Cinderella Deatrice FALCON, MD;  [facility];  Service: Endoscopy;;   TONSILLECTOMY AND ADENOIDECTOMY     age 50    SOCIAL HISTORY: Social History   Socioeconomic History   Marital status: Married    Spouse name: Not on file   Number of children: Not on file   Years of education: Not on file   Highest education level: Not on file  Occupational History   Not on file  Tobacco Use   Smoking status: Former    Types: Cigarettes   Smokeless tobacco: Never  Vaping Use   Vaping status: Never Used  Substance and Sexual Activity   Alcohol use: Never   Drug use: No   Sexual activity: Not on file  Other Topics Concern   Not on file  Social History  Narrative   Not on file   Social Drivers of Health   Financial Resource Strain: Not on file  Food Insecurity: No Food Insecurity (08/15/2024)   Hunger Vital Sign    Worried About Running Out of Food in the Last Year: Never true    Ran Out of Food in the Last Year: Never true  Transportation Needs: No Transportation Needs (08/15/2024)   PRAPARE - Administrator, Civil Service (Medical): No    Lack of Transportation (Non-Medical): No  Physical Activity: Not on file  Stress: Not on file  Social Connections: Unknown (01/20/2023)   Received from [facility]   Social Network    Social Network: Not on file  Intimate Partner Violence: Not At Risk (08/15/2024)   Humiliation, Afraid, Rape, and Kick questionnaire    Fear of Current or Ex-Partner: No    Emotionally Abused: No    Physically Abused: No    Sexually Abused: No    FAMILY HISTORY: Family History  Problem Relation Age of Onset   Hypertension Mother    Hypothyroidism Mother    Anemia Mother    Heart attack Father    Cancer Sister        colon at 75    ALLERGIES:  is allergic to penicillins.  MEDICATIONS:  Current Outpatient Medications  Medication Sig Dispense Refill   acetaminophen  (TYLENOL ) 500 MG tablet Take 1,000 mg by mouth every 6 (six) hours as needed for moderate pain or headache.     ALPRAZolam  (XANAX ) 1 MG tablet Take 1 mg by mouth 4 (four) times daily as needed for anxiety.     atenolol  (TENORMIN ) 25 MG tablet Take 25 mg by mouth 2 (two) times daily.     Atogepant  (QULIPTA ) 60 MG TABS Take 1 tablet (60 mg total) by mouth daily. (Patient taking differently: Take 60 mg by mouth daily as needed (migraines).) 30 tablet 6   cetirizine (ZYRTEC) 10 MG tablet Take 10 mg by mouth daily as needed for allergies.     doxycycline  (VIBRAMYCIN ) 100 MG capsule Take 1 capsule (100 mg total) by mouth 2 (two) times daily for 28 days. 56 capsule 0   fluticasone (FLONASE) 50 MCG/ACT nasal spray Place 2 sprays into  both nostrils daily as needed for allergies or rhinitis.     glipiZIDE (GLUCOTROL XL) 10 MG 24 hr tablet Take 10 mg by mouth 2 (two) times daily.     hydrochlorothiazide  (HYDRODIURIL ) 12.5 MG tablet Take 12.5 mg by mouth daily.     losartan  (COZAAR ) 25 MG tablet Take 25 mg by mouth daily.     omeprazole (PRILOSEC OTC) 20 MG tablet Take 20 mg by mouth daily as needed (acid reflux).     oxyCODONE -acetaminophen  (PERCOCET/ROXICET) 5-325 MG tablet Take 1 tablet by mouth every 6 (six) hours as needed for severe pain (pain score 7-10). 15 tablet 0   OZEMPIC, 0.25 OR 0.5 MG/DOSE, 2 MG/3ML SOPN Inject 0.5 mg into the skin once a week. Thursdays     PARoxetine  (PAXIL ) 40 MG tablet Take 40 mg by mouth daily.     QUEtiapine (SEROQUEL) 100 MG tablet Take 100 mg by mouth at bedtime.     tamsulosin  (FLOMAX ) 0.4 MG CAPS capsule Take 1 capsule (0.4 mg total) by mouth daily after supper. 30 capsule 11   traZODone  (DESYREL ) 50 MG tablet Take 100 mg by mouth at bedtime.     Vitamin D , Ergocalciferol , (DRISDOL ) 1.25 MG (50000 UNIT) CAPS capsule Take 1 capsule (50,000 Units total) by mouth every 7 (seven) days. 12 capsule 0   No current facility-administered medications for this visit.    PHYSICAL EXAMINATION:  Vitals:   08/15/24 1347  BP: 119/84  Pulse: 88  Resp: (!) 98  Temp: (!) 96.8 F (36 C)   Filed Weights   08/15/24 1347  Weight: 267 lb 8 oz (121.3 kg)    Physical Exam Vitals and nursing note reviewed.  HENT:     Head: Normocephalic and atraumatic.     Mouth/Throat:     Pharynx: Oropharynx is clear.  Eyes:     Extraocular Movements: Extraocular movements intact.     Pupils: Pupils are equal, round, and reactive to light.  Cardiovascular:     Rate and Rhythm: Normal rate and regular rhythm.  Pulmonary:     Comments: Decreased breath sounds bilaterally.  Abdominal:     Palpations: Abdomen is soft.  Musculoskeletal:        General: Normal range of motion.     Cervical back: Normal range  of motion.  Skin:    General: Skin is warm.  Neurological:     General: No focal deficit present.     Mental Status: He is alert and oriented to person, place, and  time.  Psychiatric:        Behavior: Behavior normal.        Judgment: Judgment normal.      LABORATORY DATA:  I have reviewed the data as listed Lab Results  Component Value Date   WBC 3.4 (L) 08/04/2024   HGB 13.8 08/04/2024   HCT 41.0 08/04/2024   MCV 86.9 08/04/2024   PLT 103 (L) 08/04/2024   Recent Labs    06/29/24 1022 07/29/24 1518 08/04/24 1707  NA 136 134* 136  K 3.9 3.9 4.0  CL 104 98 101  CO2 22 25 24   GLUCOSE 292* 297* 193*  BUN 10 10 13   CREATININE 1.05 1.02 0.98  CALCIUM  8.4* 9.0 8.7*  GFRNONAA >60 >60 >60  PROT  --  7.2  --   ALBUMIN  --  4.2  --   AST  --  35  --   ALT  --  21  --   ALKPHOS  --  82  --   BILITOT  --  0.9  --     RADIOGRAPHIC STUDIES: I have personally reviewed the radiological images as listed and agreed with the findings in the report. No results found.  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 Platelet counts 95-128, WBC slightly low. 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 No liver cancer on ultrasound. Bone marrow biopsy not indicated. - Continue follow-up with gastroenterologist or family doctor for ultrasounds every six months to monitor for liver cancer. - Advised dietary modifications: reduce processed foods, increase vegetables, lean meats, fish, reduce red meat. - Encouraged regular exercise. - No additional blood work or workup recommended.  Anxiety disorder Chronic anxiety managed with Seroquel, quidipine, trazodone . Persistent fatigue since mild stroke in 2022, possibly medication-related. - Discuss with psychiatrist adjusting psychiatric medications to address anxiety and  fatigue.      Thank you Dr. Gayla  for allowing me to participate in the care of your pleasant patient. Please do not hesitate to contact me with questions or concerns in the interim.  Patient lives in [place]-I gave him the option of following up at [facility] center/or with his PCP.  He will let us  know of his preference.   # DISPOSITION: # NO labs today-  # follow up  as needed- Dr.B    All questions were answered. The patient knows to call the clinic with any problems, questions or concerns.       Cindy JONELLE Joe, MD 08/15/2024 3:11 PM

## 2024-08-15 NOTE — Assessment & Plan Note (Addendum)
  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 Platelet counts 95-128, WBC slightly low. 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 No liver cancer on ultrasound. Bone marrow biopsy not indicated. - Continue follow-up with gastroenterologist or family doctor for ultrasounds every six months to monitor for liver cancer. - Advised dietary modifications: reduce processed foods, increase vegetables, lean meats, fish, reduce red meat. - Encouraged regular exercise. - No additional blood work or workup recommended.  Anxiety disorder Chronic anxiety managed with Seroquel, quidipine, trazodone . Persistent fatigue since mild stroke in 2022, possibly medication-related. - Discuss with psychiatrist adjusting psychiatric medications to address anxiety and fatigue.      Thank you Dr. Gayla  for allowing me to participate in the care of your pleasant patient. Please do not hesitate to contact me with questions or concerns in the interim.  Patient lives in [place]-I gave him the option of following up at [facility] cancer center/or with his PCP.  He will let us  know of his preference.   # DISPOSITION: # NO labs today-  # follow up  as needed- Dr.B

## 2024-08-15 NOTE — Patient Instructions (Signed)

## 2024-08-15 NOTE — Patient Instructions (Signed)
  VISIT SUMMARY: Today, we discussed your ongoing issues with low platelet counts (thrombocytopenia) and other related health concerns. We reviewed your history of cirrhosis, which is likely contributing to your low platelet counts and white blood cell counts. We also talked about your anxiety and fatigue, which may be related to your medications.  YOUR PLAN: -THROMBOCYTOPENIA, LEUKOPENIA, AND SPLENOMEGALY SECONDARY TO CIRRHOSIS AND FATTY LIVER DISEASE: Your low platelet counts and white blood cell counts are likely due to your cirrhosis and an enlarged spleen, which are related to fatty liver disease. This condition is often linked to diet and diabetes. We will continue to monitor your liver health with ultrasounds every six months to check for liver cancer. You should follow dietary recommendations to reduce processed foods, increase vegetables, lean meats, and fish, and reduce red meat. Regular exercise is also encouraged.  -ANXIETY DISORDER: Your chronic anxiety is currently managed with medications like Seroquel, quetiapine, and trazodone . Since you have been experiencing persistent fatigue since your mild stroke in 2022, it may be related to your medications. You should discuss with your psychiatrist about possibly adjusting your medications to better manage your anxiety and fatigue.  INSTRUCTIONS: Please continue to follow up with your gastroenterologist or family doctor for ultrasounds every six months to monitor for liver cancer. Additionally, discuss with your psychiatrist about adjusting your psychiatric medications to address your anxiety and fatigue.                      Contains text generated by [employer]bridge.                                 Contains text generated by Abridge.

## 2024-08-23 DIAGNOSIS — E1165 Type 2 diabetes mellitus with hyperglycemia: Secondary | ICD-10-CM | POA: Diagnosis not present

## 2024-08-23 DIAGNOSIS — R413 Other amnesia: Secondary | ICD-10-CM | POA: Diagnosis not present

## 2024-08-23 DIAGNOSIS — K760 Fatty (change of) liver, not elsewhere classified: Secondary | ICD-10-CM | POA: Diagnosis not present

## 2024-08-23 DIAGNOSIS — G44329 Chronic post-traumatic headache, not intractable: Secondary | ICD-10-CM | POA: Diagnosis not present

## 2024-08-23 DIAGNOSIS — I10 Essential (primary) hypertension: Secondary | ICD-10-CM | POA: Diagnosis not present

## 2024-08-23 DIAGNOSIS — E559 Vitamin D deficiency, unspecified: Secondary | ICD-10-CM | POA: Diagnosis not present

## 2024-08-23 DIAGNOSIS — D559 Anemia due to enzyme disorder, unspecified: Secondary | ICD-10-CM | POA: Diagnosis not present

## 2024-08-23 DIAGNOSIS — K7460 Unspecified cirrhosis of liver: Secondary | ICD-10-CM | POA: Diagnosis not present

## 2024-08-23 DIAGNOSIS — F419 Anxiety disorder, unspecified: Secondary | ICD-10-CM | POA: Diagnosis not present

## 2024-09-08 DIAGNOSIS — R0789 Other chest pain: Secondary | ICD-10-CM | POA: Diagnosis not present

## 2024-09-08 DIAGNOSIS — Z6831 Body mass index (BMI) 31.0-31.9, adult: Secondary | ICD-10-CM | POA: Diagnosis not present

## 2024-09-18 NOTE — Telephone Encounter (Signed)
 Pt has scheduled the needed f/u

## 2024-09-21 ENCOUNTER — Telehealth: Payer: Self-pay | Admitting: Adult Health

## 2024-09-21 ENCOUNTER — Encounter: Payer: Self-pay | Admitting: Adult Health

## 2024-09-21 ENCOUNTER — Ambulatory Visit: Admitting: Adult Health

## 2024-09-21 VITALS — BP 109/74 | HR 94 | Ht 77.0 in | Wt 272.8 lb

## 2024-09-21 DIAGNOSIS — Z8673 Personal history of transient ischemic attack (TIA), and cerebral infarction without residual deficits: Secondary | ICD-10-CM

## 2024-09-21 DIAGNOSIS — G43709 Chronic migraine without aura, not intractable, without status migrainosus: Secondary | ICD-10-CM

## 2024-09-21 DIAGNOSIS — G3184 Mild cognitive impairment, so stated: Secondary | ICD-10-CM | POA: Diagnosis not present

## 2024-09-21 MED ORDER — MEMANTINE HCL 28 X 5 MG & 21 X 10 MG PO TABS: ORAL_TABLET | ORAL | 0 refills | Status: DC

## 2024-09-21 NOTE — Patient Instructions (Addendum)
 Your Plan:  Start Namenda  with gradual titration to 10mg  twice daily  If doing well with this once you reach 10mg  dosing, please let me know so I can place an updated order  Referral placed for cognitive therapy at [facility]   Referral placed for repeat neurocognitive evaluation      Follow up in 6 months or call earlier if needed      Thank you for coming to see us  at [facility]. I hope we have been able to provide you high quality care today.  You may receive a patient satisfaction survey over the next few weeks. We would appreciate your feedback and comments so that we may continue to improve ourselves and the health of our patients.

## 2024-09-21 NOTE — Progress Notes (Signed)
 [facility]  PATIENT: Malik Hill DOB: 1961-08-23  REFERRING CLINICIAN: [facility] HISTORY FROM: patient, chart REASON FOR VISIT: Chronic headaches   HISTORICAL  CHIEF COMPLAINT:  Chief Complaint  Patient presents with   Follow-up    Patient in room 3 alone Patient here for follow up, patient states in the last month he's had no headaches, patient states that ever since he had his mild stroke two years ago, his short term memory is shot. Patient states he gets really confused, and he's not sleeping well. Patient states he sleeps on the couch every night, and hasn't slept in the bed since the mild stroke.  Patient states that he is concerned something might be wrong. Patient is uncomfortable going further distances when driving.      HISTORY OF PRESENT ILLNESS:   Update 09/21/2024 Malik Hill: Patient returns for follow-up visit.    His main concern today is in regards to continued memory difficulty. He feels this has gradually declined over the past year. Having greater difficulty remembering things. Gives example of getting fish every Wednesday evening but has been having difficulty remembering his wifes food order. He has been having more difficulty paying bills. Previously enjoyed playing solitaire but unable to concentrate now to play. Previously enjoyed reading back has difficulty comprehending what he is reading. He has difficulty with word finding.  He feels overall mood is well-controlled although can have times of increased anxiety which she feels can interfere with memory.  He takes paroxetine  daily as well as alprazolam  1 mg every morning, he also takes Seroquel and trazodone  to help with sleep.  He has discussed current medications with PCP previously but feels these medications are needed just to function daily.  He does still have fatigue during the day but feels better if he is continuously going, can fall asleep easily if he sits. MOCA today  23/30, deficits in visual-spatial/executive, naming, attention (initially tapping finger appropriately to A but about half way through, he started tapping his finger to every letter for about 5-10 consecutive letters until he realized what he was doing), language and delayed recall. Noted increased anxiety with mild upper extremity tremor while trying to copy the cube. Same testing completed 06/2023 with score of 27. He does have more than 12 years of education.   In regards to his migraines, he started wearing sunglasses more consistently and feels this greatly improved headaches. He will only have an occasional headache which will resolve after use of sunglasses.  He has since discontinued Qulipta  and denies current use of Nurtec.      History provided for reference purposes only NEW HPI (11/19/23, VRP): 63 year old male here for evaluation of headaches and memory loss.  Patient has had memory loss issues starting in 2023.  Had neuropsychology testing with inconclusive results due to performance validity concerns.  Also long history of anxiety, worsening stress, sleep issues.  Also long history of migraine headaches from teenage years.  Headaches resolved over the years.  However had a concussion in 2022 with recurrence of headaches.  Now having headaches mainly on the right side, frontal and occipital region, sensitive to light and sound, with some nausea.  Has tried Ubrelvy  Emgality  and variety of other medications without relief.     (06/17/23 Previously seen by Dr. Rush:) Brief HPI: 63 year old male with a history of depression, anxiety, HTN, DM, CVA, and kidney stones who follows in clinic for headaches and memory loss. MRI brain 04/06/22 showed mild  generalized atrophy and a small chronic infarct in the right parietal lobe. CTA head/neck did not show any hemodynamically significant stenosis. TTE showed EF of 60-65% with no evidence of shunt.    At his last visit he was started on nurtec  for rescue. He was started on donepezil  for memory and referred to neuropsychology. Interval History: Headaches have been well-controlled Nurtec as needed. He has not had any migraines recently so he stopped taking Emgality .   Continues to struggle with short term memory and word finding difficulty. Feels like this is worsening over time. One month ago he got lost while driving in his neighborhood. Was able to find his house because his wife was waving on the front porch. He essentially only drives to the bank and grocery story which are a block from his house. Saw neuropsychology who noted testing was invalid due to poor effort, but was not suggestive of a neurodegenerative disorder. The patient states he did try his best on the test. He has an appointment with neuropsychology for a second opinion in November. He developed diarrhea on donepezil  so he stopped it.   He was started on Lipitor but was unable to tolerate it due to muscle aches. Tried Crestor as well but couldn't tolerate this either.   Migraine days per month: 0 Headache free days per month: 30   Current Headache Regimen: Preventative: Emgality  Abortive: Nurtec    Prior Therapies                                  Rescue: Tylenol  Ubrelvy  100 mg PRN Cannot take NSAIDs due to kidney issues Triptans contrainidcated due to CVA   Prevention: Losartan  25 mg daily Atenolol  25 mg daily Paroxetine  30 mg daily Emgality  120 mg monthly Topamax contraindicated due to kidney stones    REVIEW OF SYSTEMS: Full 14 system review of systems performed and negative with exception of: as per HPI.  ALLERGIES: Allergies  Allergen Reactions   Penicillins Rash    HOME MEDICATIONS: Outpatient Medications Prior to Visit  Medication Sig Dispense Refill   acetaminophen  (TYLENOL ) 500 MG tablet Take 1,000 mg by mouth every 6 (six) hours as needed for moderate pain or headache.     ALPRAZolam  (XANAX ) 1 MG tablet Take 1 mg by mouth 4 (four)  times daily as needed for anxiety.     atenolol  (TENORMIN ) 25 MG tablet Take 25 mg by mouth 2 (two) times daily.     cetirizine (ZYRTEC) 10 MG tablet Take 10 mg by mouth daily as needed for allergies.     fluticasone (FLONASE) 50 MCG/ACT nasal spray Place 2 sprays into both nostrils daily as needed for allergies or rhinitis.     glipiZIDE (GLUCOTROL XL) 10 MG 24 hr tablet Take 10 mg by mouth 2 (two) times daily.     hydrochlorothiazide  (HYDRODIURIL ) 12.5 MG tablet Take 12.5 mg by mouth daily.     losartan  (COZAAR ) 25 MG tablet Take 25 mg by mouth daily.     omeprazole (PRILOSEC OTC) 20 MG tablet Take 20 mg by mouth daily as needed (acid reflux).     oxyCODONE -acetaminophen  (PERCOCET/ROXICET) 5-325 MG tablet Take 1 tablet by mouth every 6 (six) hours as needed for severe pain (pain score 7-10). 15 tablet 0   OZEMPIC, 0.25 OR 0.5 MG/DOSE, 2 MG/3ML SOPN Inject 0.5 mg into the skin once a week. Thursdays     PARoxetine  (PAXIL ) 40  MG tablet Take 40 mg by mouth daily.     QUEtiapine (SEROQUEL) 100 MG tablet Take 100 mg by mouth at bedtime.     tamsulosin  (FLOMAX ) 0.4 MG CAPS capsule Take 1 capsule (0.4 mg total) by mouth daily after supper. 30 capsule 11   traZODone  (DESYREL ) 50 MG tablet Take 100 mg by mouth at bedtime.     Vitamin D , Ergocalciferol , (DRISDOL ) 1.25 MG (50000 UNIT) CAPS capsule Take 1 capsule (50,000 Units total) by mouth every 7 (seven) days. 12 capsule 0   Atogepant  (QULIPTA ) 60 MG TABS Take 1 tablet (60 mg total) by mouth daily. (Patient not taking: Reported on 09/21/2024) 30 tablet 6   No facility-administered medications prior to visit.    PAST MEDICAL HISTORY: Past Medical History:  Diagnosis Date   Anxiety    BPH (benign prostatic hyperplasia)    Cirrhosis (HCC)    Depression    Diabetes mellitus without complication (HCC)    Headache    History of kidney stones    MDD (major depressive disorder)    Memory loss    Osteoarthritis    Stroke (HCC) 09/2021    PAST  SURGICAL HISTORY: Past Surgical History:  Procedure Laterality Date   BIOPSY  06/10/2023   Procedure: BIOPSY;  Surgeon: Cinderella Deatrice FALCON, MD;  [facility];  Service: Endoscopy;;   BUNIONECTOMY     COLONOSCOPY  06/09/2007   COLONOSCOPY WITH PROPOFOL  N/A 06/10/2023   Procedure: COLONOSCOPY WITH PROPOFOL ;  Surgeon: Cinderella Deatrice FALCON, MD;  Location: [facility];  Service: Endoscopy;  Laterality: N/A;  9:45am;asa 3   CYSTOSCOPY WITH RETROGRADE PYELOGRAM, URETEROSCOPY AND STENT PLACEMENT Left 01/21/2023   Procedure: CYSTOSCOPY WITH RETROGRADE PYELOGRAM, URETEROSCOPY AND STENT PLACEMENT;  Surgeon: Sherrilee Belvie CROME, MD;  Location: [facility];  Service: Urology;  Laterality: Left;   CYSTOSCOPY WITH RETROGRADE PYELOGRAM, URETEROSCOPY AND STENT PLACEMENT Left 07/01/2023   Procedure: CYSTOSCOPY WITH RETROGRADE PYELOGRAM, URETEROSCOPY AND STENT PLACEMENT;  Surgeon: Sherrilee Belvie CROME, MD;  Location: [facility];  Service: Urology;  Laterality: Left;   CYSTOSCOPY WITH URETHRAL DILATATION N/A 07/03/2024   Procedure: CYSTOSCOPY, WITH URETHRAL DILATION WITH OPTILUME;  Surgeon: Sherrilee Belvie CROME, MD;  Location: [facility];  Service: Urology;  Laterality: N/A;   ESOPHAGOGASTRODUODENOSCOPY (EGD) WITH PROPOFOL  N/A 06/10/2023   Procedure: ESOPHAGOGASTRODUODENOSCOPY (EGD) WITH PROPOFOL ;  Surgeon: Cinderella Deatrice FALCON, MD;  [facility]: [facility];  Service: Endoscopy;  Laterality: N/A;  9:45am;asa 3   HOLMIUM LASER APPLICATION Left 07/01/2023   Procedure: HOLMIUM LASER APPLICATION;  Surgeon: Sherrilee Belvie CROME, MD;  Location: [facility];  Service: Urology;  Laterality: Left;   KNEE SURGERY Left 2015   PENILE ADHESIONS LYSIS     POLYPECTOMY  06/10/2023   Procedure: POLYPECTOMY;  Surgeon: Cinderella Deatrice FALCON, MD;  [facility]: [facility];  Service: Endoscopy;;   TONSILLECTOMY AND ADENOIDECTOMY     age 51    FAMILY HISTORY: Family History  Problem Relation Age of Onset   Hypertension Mother    Hypothyroidism Mother     Anemia Mother    Anxiety disorder Mother    Diabetes Mother    Varicose Veins Mother    Heart attack Father    Alcohol abuse Father    Cancer Sister        colon at 45    SOCIAL HISTORY: Social History   Socioeconomic History   Marital status: Married    Spouse name: Not on file   Number of children: Not  on file   Years of education: Not on file   Highest education level: Not on file  Occupational History   Not on file  Tobacco Use   Smoking status: Former    Types: Cigarettes   Smokeless tobacco: Never  Vaping Use   Vaping status: Never Used  Substance and Sexual Activity   Alcohol use: Never   Drug use: No   Sexual activity: Not on file  Other Topics Concern   Not on file  Social History Narrative   Patient lives at home with his wife   Patient is currently retired.    Social Drivers of Health   Tobacco Use: Medium Risk (09/21/2024)   Patient History    Smoking Tobacco Use: Former    Smokeless Tobacco Use: Never    Passive Exposure: Not on Actuary Strain: Not on file  Food Insecurity: No Food Insecurity (08/15/2024)   Epic    Worried About Programme Researcher, Broadcasting/film/video in the Last Year: Never true    Ran Out of Food in the Last Year: Never true  Transportation Needs: No Transportation Needs (08/15/2024)   Epic    Lack of Transportation (Medical): No    Lack of Transportation (Non-Medical): No  Physical Activity: Not on file  Stress: Not on file  Social Connections: Unknown (01/20/2023)   Received from [facility]   Social Network    Social Network: Not on file  Intimate Partner Violence: Not At Risk (08/15/2024)   Epic    Fear of Current or Ex-Partner: No    Emotionally Abused: No    Physically Abused: No    Sexually Abused: No  Depression (PHQ2-9): Low Risk (08/15/2024)   Depression (PHQ2-9)    PHQ-2 Score: 1  Alcohol Screen: Not on file  Housing: Low Risk (08/15/2024)   Epic    Unable to Pay for Housing in the Last Year: No    Number  of Times Moved in the Last Year: 1    Homeless in the Last Year: No  Utilities: Not At Risk (08/15/2024)   Epic    Threatened with loss of utilities: No  Health Literacy: Not on file    PHYSICAL EXAM  GENERAL EXAM/CONSTITUTIONAL: Vitals:  Vitals:   09/21/24 1111  BP: 109/74  Pulse: 94  Weight: 272 lb 12.8 oz (123.7 kg)  Height: 6' 5 (1.956 m)   Patient is in no distress; well developed, nourished and groomed; neck is supple   MUSCULOSKELETAL: Gait, strength, tone, movements noted in Neurologic exam below  NEUROLOGIC: MENTAL STATUS:     09/21/2024   11:41 AM 06/17/2023   11:43 AM 08/11/2022   11:21 AM  Montreal Cognitive Assessment   Visuospatial/ Executive (0/5) 3 5 0  Naming (0/3) 2 3 1   Attention: Read list of digits (0/2) 2 2 2   Attention: Read list of letters (0/1) 0 1 1  Attention: Serial 7 subtraction starting at 100 (0/3) 3 3 1   Language: Repeat phrase (0/2) 2 1 1   Language : Fluency (0/1) 0 1 0  Abstraction (0/2) 2 2 2   Delayed Recall (0/5) 3 3 2   Orientation (0/6) 6 6 6   Total 23 27 16   Adjusted Score (based on education)  28 16   awake, alert, oriented to person, place and time recent memory impaired and remote memory intact normal attention and concentration comprehension intact, naming intact, intermittent word finding difficulty or delayed recall during conversation fund of knowledge appropriate  CRANIAL NERVE:  2nd - no papilledema on fundoscopic exam 2nd, 3rd, 4th, 6th - pupils equal and reactive to light, visual fields full to confrontation, extraocular muscles intact, no nystagmus 5th - facial sensation symmetric 7th - facial strength symmetric 8th - hearing intact 9th - palate elevates symmetrically, uvula midline 11th - shoulder shrug symmetric 12th - tongue protrusion midline  MOTOR:  normal bulk and tone, full strength in the BUE, BLE  COORDINATION:  finger-nose-finger, fine finger movements normal  GAIT/STATION:  narrow based  gait     DIAGNOSTIC DATA (LABS, IMAGING, TESTING) - I reviewed patient records, labs, notes, testing and imaging myself where available.  Lab Results  Component Value Date   WBC 3.4 (L) 08/04/2024   HGB 13.8 08/04/2024   HCT 41.0 08/04/2024   MCV 86.9 08/04/2024   PLT 103 (L) 08/04/2024      Component Value Date/Time   NA 136 08/04/2024 1707   K 4.0 08/04/2024 1707   CL 101 08/04/2024 1707   CO2 24 08/04/2024 1707   GLUCOSE 193 (H) 08/04/2024 1707   BUN 13 08/04/2024 1707   CREATININE 0.98 08/04/2024 1707   CREATININE 1.20 05/18/2023 1152   CALCIUM  8.7 (L) 08/04/2024 1707   PROT 7.2 07/29/2024 1518   ALBUMIN  4.2 07/29/2024 1518   AST 35 07/29/2024 1518   ALT 21 07/29/2024 1518   ALKPHOS 82 07/29/2024 1518   BILITOT 0.9 07/29/2024 1518   GFRNONAA >60 08/04/2024 1707   GFRAA >60 08/10/2016 1331   No results found for: CHOL, HDL, LDLCALC, LDLDIRECT, TRIG, CHOLHDL Lab Results  Component Value Date   HGBA1C 7.7 (H) 06/29/2024   Lab Results  Component Value Date   VITAMINB12 222 09/26/2021   Lab Results  Component Value Date   TSH 0.818 09/26/2021    09/25/21 MR THORACIC SPINE IMPRESSION 1. Thoracic multilevel disc degeneration. Multi level disc protrusions. Largest disc protrusion is at T9-10 causing cord flattening and moderate spinal stenosis. 2. Negative for thoracic infection   09/25/21 MR LUMBAR SPINE IMPRESSION 1. Negative for lumbar infection.  Chronic fracture L1 2. Shallow left foraminal disc protrusion L3-4. Central disc protrusion L5-S1 touching the S1 nerve roots but not causing nerve root compression.  04/06/22 MRI brain 1. No evidence of acute intracranial abnormality.  2. Small chronic cortical/subcortical infarct within the medial  right parietal lobe.  3. Prominent perivascular space versus chronic lacunar infarct  within the right caudate head.  4. Mild generalized cerebral atrophy.  5. Mild bilateral frontal and ethmoid  sinus mucosal thickening.  6. Bilateral mastoid effusions.       ASSESSMENT AND PLAN  63 y.o. year old male here with:    Dx:  1. Mild cognitive impairment   2. History of stroke   3. Chronic migraine without aura without status migrainosus, not intractable      PLAN:  HEADACHES (since Sept 2023 post-concussion; h/o migraine since teenager years; likely combination of chronic daily headaches + migraine with aura)  Overall greatly improved after utilizing sunglasses Declines interest in preventative or rescue medications at this time Discussed continued conservative/nonpharmacological measures Advised to call with any worsening headaches   MEMORY LOSS (suspect multifactoral from prior stroke, underlying anxiety, poor sleep and polypharmacy; neuropsychology testing in May 2024 was inconclusive due to performance validity problems) -referred to physical medicine and rehab neuropsychology for recent neurocognitive evaluation.  Prior neurocognitive testing in 2024 inconclusive due to performance concerns although patient reports he tried his best on the testing and cannot understand  why MD thought he would be trying to alter results. Based on testing, can consider further evaluation based on results such as with biomarkers and/or PET imaging -referral placed to SLP for cognitive therapy at [facility] -due to concern of gradual decline, start memantine  with gradual titration to 10 mg twice daily -continue ongoing close f/u with PCP to discuss ongoing need of multiple medications that could be contributing -Continue to follow with PCP/behavioral health for management/monitoring of anxiety -Discussed importance of routine physical and cognitive exercises as well as ensuring good sleep, healthy diet, routine physical activity, management of vascular risk factors and routine socialization    Orders Placed This Encounter  Procedures   Ambulatory referral to Speech Therapy   Ambulatory  referral to Neuropsychology   Meds ordered this encounter  Medications   memantine  (NAMENDA  TITRATION PAK) tablet pack    Sig: 5 mg/day for =1 week; 5 mg twice daily for =1 week; 15 mg/day given in 5 mg and 10 mg separated doses for =1 week; then 10 mg twice daily    Dispense:  49 tablet    Refill:  0   Return in about 6 months (around 03/22/2025).    I personally spent a total of 60 minutes in the care of the patient today including preparing to see the patient, getting/reviewing separately obtained history, performing a medically appropriate exam/evaluation, counseling and educating, placing orders, referring and communicating with other health care professionals, and documenting clinical information in the EHR. This is our first time meeting and time has been spent reviewing past medical history and relevant medical records.   Harlene Bogaert, AGNP-BC  [facility] [address] [place], [place] [address]  Phone [phone] Fax [phone] Note: This document was prepared with digital dictation and possible smart phrase technology. Any transcriptional errors that result from this process are unintentional.

## 2024-09-21 NOTE — Telephone Encounter (Signed)
 Referral for Psychology sent thru epic to [facility]  [facility] Phone: [phone]

## 2024-09-21 NOTE — Telephone Encounter (Signed)
 Referral speech Therapy  sent thru epic to [facility] at [facility] at [facility] [phone]

## 2024-10-02 ENCOUNTER — Encounter (HOSPITAL_COMMUNITY): Payer: Self-pay | Admitting: Speech Pathology

## 2024-10-02 ENCOUNTER — Ambulatory Visit (HOSPITAL_COMMUNITY): Admitting: Speech Pathology

## 2024-10-02 ENCOUNTER — Other Ambulatory Visit: Payer: Self-pay

## 2024-10-02 DIAGNOSIS — G3184 Mild cognitive impairment, so stated: Secondary | ICD-10-CM | POA: Diagnosis not present

## 2024-10-02 DIAGNOSIS — Z8673 Personal history of transient ischemic attack (TIA), and cerebral infarction without residual deficits: Secondary | ICD-10-CM | POA: Diagnosis not present

## 2024-10-02 DIAGNOSIS — R41841 Cognitive communication deficit: Secondary | ICD-10-CM | POA: Diagnosis present

## 2024-10-02 NOTE — Therapy (Signed)
 " OUTPATIENT SPEECH LANGUAGE PATHOLOGY EVALUATION   Patient Name: Malik Hill MRN: [id number] DOB:1961-02-03, 63 y.o., male Today's Date: 10/02/2024  PCP: [facility] REFERRING PROVIDER: Whitfield Raisin, NP  END OF SESSION:  End of Session - 10/02/24 1206     Visit Number 1    Number of Visits 9    Date for Recertification  11/09/24    Authorization Type [employer]   Eff: 10/06/23  Oop: 4000  Copay 25  Auth yes   SLP Start Time 1120    SLP Stop Time  1210    SLP Time Calculation (min) 50 min    Activity Tolerance Patient tolerated treatment well          Past Medical History:  Diagnosis Date   Anxiety    BPH (benign prostatic hyperplasia)    Cirrhosis (HCC)    Depression    Diabetes mellitus without complication (HCC)    Headache    History of kidney stones    MDD (major depressive disorder)    Memory loss    Osteoarthritis    Stroke (HCC) 09/2021   Past Surgical History:  Procedure Laterality Date   BIOPSY  06/10/2023   Procedure: BIOPSY;  Surgeon: Cinderella Deatrice FALCON, MD;  [facility];  Service: Endoscopy;;   BUNIONECTOMY     COLONOSCOPY  06/09/2007   COLONOSCOPY WITH PROPOFOL  N/A 06/10/2023   Procedure: COLONOSCOPY WITH PROPOFOL ;  Surgeon: Cinderella Deatrice FALCON, MD;  [facility];  Service: Endoscopy;  Laterality: N/A;  9:45am;asa 3   CYSTOSCOPY WITH RETROGRADE PYELOGRAM, URETEROSCOPY AND STENT PLACEMENT Left 01/21/2023   Procedure: CYSTOSCOPY WITH RETROGRADE PYELOGRAM, URETEROSCOPY AND STENT PLACEMENT;  Surgeon: Sherrilee Belvie CROME, MD;  Location: [facility];  Service: Urology;  Laterality: Left;   CYSTOSCOPY WITH RETROGRADE PYELOGRAM, URETEROSCOPY AND STENT PLACEMENT Left 07/01/2023   Procedure: CYSTOSCOPY WITH RETROGRADE PYELOGRAM, URETEROSCOPY AND STENT PLACEMENT;  Surgeon: Sherrilee Belvie CROME, MD;  Location: [facility];  Service: Urology;  Laterality: Left;   CYSTOSCOPY WITH URETHRAL DILATATION N/A 07/03/2024   Procedure: CYSTOSCOPY,  WITH URETHRAL DILATION WITH OPTILUME;  Surgeon: Sherrilee Belvie CROME, MD;  Location: [facility];  Service: Urology;  Laterality: N/A;   ESOPHAGOGASTRODUODENOSCOPY (EGD) WITH PROPOFOL  N/A 06/10/2023   Procedure: ESOPHAGOGASTRODUODENOSCOPY (EGD) WITH PROPOFOL ;  Surgeon: Cinderella Deatrice FALCON, MD;  [facility];  Service: Endoscopy;  Laterality: N/A;  9:45am;asa 3   HOLMIUM LASER APPLICATION Left 07/01/2023   Procedure: HOLMIUM LASER APPLICATION;  Surgeon: Sherrilee Belvie CROME, MD;  Location: [facility];  Service: Urology;  Laterality: Left;   KNEE SURGERY Left 2015   PENILE ADHESIONS LYSIS     POLYPECTOMY  06/10/2023   Procedure: POLYPECTOMY;  Surgeon: Cinderella Deatrice FALCON, MD;  [facility];  Service: Endoscopy;;   TONSILLECTOMY AND ADENOIDECTOMY     age 48   Patient Active Problem List   Diagnosis Date Noted   Thrombocytopenia 08/15/2024   Gastric polyp 06/10/2023   Gastritis and gastroduodenitis 06/10/2023   Adenomatous polyp of transverse colon 06/10/2023   Portal hypertension (HCC) 05/18/2023   BMI 32.0-32.9,adult 05/18/2023   Cirrhosis of liver without ascites (HCC) 05/18/2023   Colon cancer screening 05/18/2023   Nephrolithiasis 01/21/2023   Weakness    Bronchitis    SIRS (systemic inflammatory response syndrome) (HCC) 09/25/2021   DM (diabetes mellitus) (HCC) 09/25/2021   Essential hypertension 09/25/2021   Depression 09/25/2021    ONSET DATE: ~2023   REFERRING DIAG: G31.84 (ICD-10-CM) - Mild  cognitive impairment Z86.73 (ICD-10-CM) - History of stroke  THERAPY DIAG:  Cognitive communication deficit  Rationale for Evaluation and Treatment: Rehabilitation  SUBJECTIVE:   SUBJECTIVE STATEMENT: I get turned around. (Driving)  Pt accompanied by: self  PERTINENT HISTORY: Malik Hill is a 63 yo male who was referred by Harlene Bogaert, NP (neurology) for a cognitive linguistic evaluation and treatment due to Pt reports of memory loss, confusion, and word finding  difficulties.  Past medical history is significant for osteoarthritis, stroke, memory loss, history of kidney stones, depression, anxiety, liver disease, BPH, hyperlipidemia, migraine headaches, and obesity, with excessive daytime somnolence as well as chronic difficulty initiating and maintaining sleep, DM2, HTN, HLD, generalized anxiety disorder, and an episode of altered mental status back in December, 2022.  PAIN:  Are you having pain? No  FALLS: Has patient fallen in last 6 months?  Yes, Number of falls: 2x, Comment: fell after tripping over curb, fell out of shower, but now has handles  LIVING ENVIRONMENT: Lives with: lives with their spouse Lives in: House/apartment  PLOF:  Level of assistance: Independent with ADLs, Independent with IADLs Employment: Retired  PATIENT GOALS: Improve attention and focus, not get lost while driving  OBJECTIVE:  Note: Objective measures were completed at Evaluation unless otherwise noted.  DIAGNOSTIC FINDINGS:  MRI Brain 04/06/2022 as per radiology report: Impression 1. No evidence of acute intracranial abnormality. 2. Small chronic cortical/subcortical infarct within the medial right parietal lobe. 3. Prominent perivascular space versus chronic lacunar infarct within the right caudate head. 4. Mild generalized cerebral atrophy. 5. Mild bilateral frontal and ethmoid sinus mucosal thickening. 6. Bilateral mastoid effusions.   COGNITION: Overall cognitive status: Impaired: Areas of impairment:  Attention: Impaired: Sustained Memory: Impaired: Working It Consultant function: Impaired: Planning and Slow processing  Areas of impairment:  Attention: Impaired: Sustained Memory: Impaired: Working It Consultant function: Impaired: Planning and Slow processing Functional deficits: getting lost while driving, missing bill payments  AUDITORY COMPREHENSION: Overall auditory comprehension: Impaired: complex YES/NO questions:  Appears intact Following directions: Impaired: complex Conversation: Complex Interfering components: attention, processing speed, and working Research Scientist (life Sciences): extra processing time and repetition/stressing words  READING COMPREHENSION: Impaired: Pt has to re-read due to attention and focus difficulties  EXPRESSION: verbal  VERBAL EXPRESSION: Level of generative/spontaneous verbalization: conversation Automatic speech: name: intact, social response: intact, and day of week: intact  Repetition: Appears intact Naming: Responsive: 76-100%, Confrontation: 76-100%, and Divergent: 51-75% Pragmatics: Appears intact Comments: Pt reports some difficulty with word finding Interfering components: attention Effective technique: phonemic cues Non-verbal means of communication: N/A  WRITTEN EXPRESSION: Dominant hand: right Written expression: Not tested  MOTOR SPEECH: Overall motor speech: Appears intact Level of impairment: N/A Respiration: WNL Phonation: normal Resonance: WFL Articulation: Appears intact Intelligibility: Intelligible Motor planning: Appears intact Motor speech errors: N/A Interfering components: N/A Effective technique: N/A  ORAL MOTOR EXAMINATION: Overall status: WFL Comments: N/A  RECOMMENDATIONS FROM OBJECTIVE SWALLOW STUDY (MBSS/FEES):  N/A  STANDARDIZED ASSESSMENTS: SLUMS: 22/30  [facility] SLUMS Examination Orientation  3/3  Numeric Problem Solving  3/3  Memory  3/5  Attention 2/2  Thought Organization 2/3 (11 animals in 1 minute)  Clock Drawing 2/4  Visuospatial Skills               2/2  Short Story Recall  4/8  Total  22/30     Scoring  High School Education  Less than High School Education   Normal  27-30 25-30  Mild Neurocognitive Disorder 21-26  20-24  Dementia  1-20 1-19                                                                                                                      TREATMENT DATE: 10/02/2024 Evaluation completed  this date only    PATIENT EDUCATION: Education details: Reviewed plan for SLP treatment to address deficits stated below Person educated: Patient Education method: Explanation Education comprehension: verbalized understanding   GOALS: Goals reviewed with patient? Yes  SHORT TERM GOALS: Target date: 11/09/2024  Pt will implement memory strategies in functional therapy activities with 90% acc with min cues. Baseline:70% Goal status: INITIAL  2.  Pt will increase recall for requested information to 90% acc during functional memory exercises with use of compensatory strategies as needed when provided min cues.   Baseline: 50% Goal status: INITIAL  3.  Pt will complete moderately complex attention tasks in session with 90% acc with min assist for use of strategies. Baseline: 75% Goal status: INITIAL  4.  Pt will implement word-finding strategies with 90% accuracy when unable to verbalize desired word in conversation/functional tasks with min assist.    Baseline: mi/mod assist Goal status: INITIAL  5.  Pt will describe objects and pictures by providing at least three salient features as judged by clinician with 90% acc when provided min cues.  Baseline: 80% Goal status: INITIAL  LONG TERM GOALS: Target date: 12/03/2024  Patient will increase word finding of low frequency info/abstract thoughts with cues using compensatory strategies. Baseline: mild impairment Goal status: INITIAL  2.  Pt will incorporate memory and attention strategies in functional tasks to [facility]  Baseline: mi/mod assist Goal status: INITIAL   ASSESSMENT:  CLINICAL IMPRESSION: from initial evaluation 10/02/2024) Patient is a 63 y.o. male who was seen today for a cognitive linguistic evaluation following referral from Harlene Bogaert, NP.  Pt has a complicated history in terms of onset of his symptoms of reduced attention, memory, and word finding. He has a remote history of a right parietal lobe infarct (~04/2022),  anxiety and depression, and possible head injuries from previous falls from 2022 onward. He lives with his wife and he has been responsible for finances, medication management, cleaning, driving, and picking up foods (light cooking only). He reports getting lost while driving, missing bill payments, and poor attention when trying to read. They recently (6 months ago) moved into a 55+ community and this has provided good support for him and his wife. He presents with mild neurocognitive deficits which may be impacted by anxiety and depression. Recommend short term SLP therapy to address attention, memory, thought organization, and word finding deficits. Pt is motivated to address the above and has good family support.   OBJECTIVE IMPAIRMENTS: include attention, memory, executive functioning, expressive language, and receptive language. These impairments are limiting patient from managing medications, managing appointments, managing finances, and effectively communicating at home and in community. Factors affecting potential to achieve goals and functional outcome are ability to learn/carryover information. Patient will  benefit from skilled SLP services to address above impairments and improve overall function.  REHAB POTENTIAL: Good  PLAN:  SLP FREQUENCY: 2x/week  SLP DURATION: 6 weeks  PLANNED INTERVENTIONS: Cueing hierachy, Cognitive reorganization, Internal/external aids, Functional tasks, Multimodal communication approach, SLP instruction and feedback, Compensatory strategies, Patient/family education, [phone] Treatment of speech (30 or 45 min) , and [id number]- Speech [address], Artic, Phon, Eval Compre, Express   Thank you,  Lamar Candy, CCC-SLP [phone]  CANDY LAMAR, CCC-SLP 10/02/2024, 12:10 PM  [employer] Auth Request Treatment Start Date: 10/10/2024  Referring diagnosis code (ICD 10)? G31.84 (ICD-10-CM) - Mild cognitive impairment Z86.73 (ICD-10-CM) - History of stroke Treatment  diagnosis codes (ICD 10)? (if different than referring diagnosis) R 41.841  What was this (referring dx) caused by? []  Surgery []  Fall []  Ongoing issue []  Arthritis [x]  Other: possibly due to a fall in 2023, unknown__________  Laterality: []  Rt []  Lt []  Both  Deficits: []  Pain []  Stiffness []  Weakness []  Edema []  Balance Deficits []  Coordination []  Gait Disturbance []  ROM [x]  Other   Functional Tool Score: N/A  CPT codes: See Planned Interventions listed in the Plan section of the Evaluation.      "

## 2024-10-10 ENCOUNTER — Ambulatory Visit (HOSPITAL_COMMUNITY): Attending: Adult Health | Admitting: Speech Pathology

## 2024-10-10 DIAGNOSIS — R41841 Cognitive communication deficit: Secondary | ICD-10-CM | POA: Insufficient documentation

## 2024-10-10 NOTE — Therapy (Signed)
 " OUTPATIENT SPEECH LANGUAGE PATHOLOGY TREATMENT   Patient Name: Malik Hill MRN: [id number] DOB:01-Mar-1961, 64 y.o., male Today's Date: 10/10/2024  PCP: [facility] REFERRING PROVIDER: Whitfield Raisin, NP  END OF SESSION:  End of Session - 10/10/24 1343     Visit Number 2    Number of Visits 9    Date for Recertification  11/09/24    Authorization Type [employer]   Eff: 10/06/23  Oop: 4000  Copay 25  Auth yes   SLP Start Time 1333    SLP Stop Time  1416    SLP Time Calculation (min) 43 min    Activity Tolerance Patient tolerated treatment well          Past Medical History:  Diagnosis Date   Anxiety    BPH (benign prostatic hyperplasia)    Cirrhosis (HCC)    Depression    Diabetes mellitus without complication (HCC)    Headache    History of kidney stones    MDD (major depressive disorder)    Memory loss    Osteoarthritis    Stroke (HCC) 09/2021   Past Surgical History:  Procedure Laterality Date   BIOPSY  06/10/2023   Procedure: BIOPSY;  Surgeon: Cinderella Deatrice FALCON, MD;  [facility];  Service: Endoscopy;;   BUNIONECTOMY     COLONOSCOPY  06/09/2007   COLONOSCOPY WITH PROPOFOL  N/A 06/10/2023   Procedure: COLONOSCOPY WITH PROPOFOL ;  Surgeon: Cinderella Deatrice FALCON, MD;  [facility];  Service: Endoscopy;  Laterality: N/A;  9:45am;asa 3   CYSTOSCOPY WITH RETROGRADE PYELOGRAM, URETEROSCOPY AND STENT PLACEMENT Left 01/21/2023   Procedure: CYSTOSCOPY WITH RETROGRADE PYELOGRAM, URETEROSCOPY AND STENT PLACEMENT;  Surgeon: Sherrilee Belvie CROME, MD;  [facility];  Service: Urology;  Laterality: Left;   CYSTOSCOPY WITH RETROGRADE PYELOGRAM, URETEROSCOPY AND STENT PLACEMENT Left 07/01/2023   Procedure: CYSTOSCOPY WITH RETROGRADE PYELOGRAM, URETEROSCOPY AND STENT PLACEMENT;  Surgeon: Sherrilee Belvie CROME, MD;  Location: [facility];  Service: Urology;  Laterality: Left;   CYSTOSCOPY WITH URETHRAL DILATATION N/A 07/03/2024   Procedure: CYSTOSCOPY, WITH  URETHRAL DILATION WITH OPTILUME;  Surgeon: Sherrilee Belvie CROME, MD;  Location: [facility];  Service: Urology;  Laterality: N/A;   ESOPHAGOGASTRODUODENOSCOPY (EGD) WITH PROPOFOL  N/A 06/10/2023   Procedure: ESOPHAGOGASTRODUODENOSCOPY (EGD) WITH PROPOFOL ;  Surgeon: Cinderella Deatrice FALCON, MD;  [facility];  Service: Endoscopy;  Laterality: N/A;  9:45am;asa 3   HOLMIUM LASER APPLICATION Left 07/01/2023   Procedure: HOLMIUM LASER APPLICATION;  Surgeon: Sherrilee Belvie CROME, MD;  Location: [facility];  Service: Urology;  Laterality: Left;   KNEE SURGERY Left 2015   PENILE ADHESIONS LYSIS     POLYPECTOMY  06/10/2023   Procedure: POLYPECTOMY;  Surgeon: Cinderella Deatrice FALCON, MD;  [facility];  Service: Endoscopy;;   TONSILLECTOMY AND ADENOIDECTOMY     age 64   Patient Active Problem List   Diagnosis Date Noted   Thrombocytopenia 08/15/2024   Gastric polyp 06/10/2023   Gastritis and gastroduodenitis 06/10/2023   Adenomatous polyp of transverse colon 06/10/2023   Portal hypertension (HCC) 05/18/2023   BMI 32.0-32.9,adult 05/18/2023   Cirrhosis of liver without ascites (HCC) 05/18/2023   Colon cancer screening 05/18/2023   Nephrolithiasis 01/21/2023   Weakness    Bronchitis    SIRS (systemic inflammatory response syndrome) (HCC) 09/25/2021   DM (diabetes mellitus) (HCC) 09/25/2021   Essential hypertension 09/25/2021   Depression 09/25/2021    ONSET DATE: ~2023   REFERRING DIAG: G31.84 (ICD-10-CM) - Mild  cognitive impairment Z86.73 (ICD-10-CM) - History of stroke  THERAPY DIAG:  Cognitive communication deficit  Rationale for Evaluation and Treatment: Rehabilitation  SUBJECTIVE:   SUBJECTIVE STATEMENT: I get turned around. (Driving)  Pt accompanied by: self  PERTINENT HISTORY: Malik Hill is a 64 yo male who was referred by Harlene Bogaert, NP (neurology) for a cognitive linguistic evaluation and treatment due to Pt reports of memory loss, confusion, and word finding  difficulties.  Past medical history is significant for osteoarthritis, stroke, memory loss, history of kidney stones, depression, anxiety, liver disease, BPH, hyperlipidemia, migraine headaches, and obesity, with excessive daytime somnolence as well as chronic difficulty initiating and maintaining sleep, DM2, HTN, HLD, generalized anxiety disorder, and an episode of altered mental status back in December, 2022.  PAIN:  Are you having pain? No  FALLS: Has patient fallen in last 6 months?  Yes, Number of falls: 2x, Comment: fell after tripping over curb, fell out of shower, but now has handles  LIVING ENVIRONMENT: Lives with: lives with their spouse Lives in: House/apartment  PLOF:  Level of assistance: Independent with ADLs, Independent with IADLs Employment: Retired  PATIENT GOALS: Improve attention and focus, not get lost while driving  OBJECTIVE:  Note: Objective measures were completed at Evaluation unless otherwise noted.  DIAGNOSTIC FINDINGS:  MRI Brain 04/06/2022 as per radiology report: Impression 1. No evidence of acute intracranial abnormality. 2. Small chronic cortical/subcortical infarct within the medial right parietal lobe. 3. Prominent perivascular space versus chronic lacunar infarct within the right caudate head. 4. Mild generalized cerebral atrophy. 5. Mild bilateral frontal and ethmoid sinus mucosal thickening. 6. Bilateral mastoid effusions.                                                                                                                      TREATMENT DATE: 10/10/2024 Malik Hill brought memantine  medication that Harlene Bogaert prescribed during his visit in December. He indicates that he does not yet see any changes. He had a few headaches and he was encouraged to track those. SLP reviewed the evaluation and goals with Pt and provided written memory strategies. Pt asked to identify barriers or problems and then SLP provided assist to generate possible  solutions. He identified two problems: going to the grocery store and forgetting what he was supposed to be buying for his wife and losing his train of thought in conversation. SLP provided education on memory strategies and guided Pt in finding possible solutions (write it down, discuss items with his wife before he leaves, call his wife, take a photo with his phone, voice memo, notes section of his phone, visual tracking of conversations by writing down key words before or during conversations). Pt asked to write down other problems he encounters at home over the next few days. He does not use a written calendar or take notes, but relies on My Chart on his phone. Continue to target goals.   PATIENT EDUCATION: Education details: Review memory strategies and write down barriers or  problems he encounters at home Person educated: Patient Education method: Explanation Education comprehension: verbalized understanding   GOALS: Goals reviewed with patient? Yes  SHORT TERM GOALS: Target date: 11/09/2024  Pt will implement memory strategies in functional therapy activities with 90% acc with min cues. Baseline:70% Goal status: ONGOING  2.  Pt will increase recall for requested information to 90% acc during functional memory exercises with use of compensatory strategies as needed when provided min cues.   Baseline: 50% Goal status: ONGOING  3.  Pt will complete moderately complex attention tasks in session with 90% acc with min assist for use of strategies. Baseline: 75% Goal status: ONGOING  4.  Pt will implement word-finding strategies with 90% accuracy when unable to verbalize desired word in conversation/functional tasks with min assist.    Baseline: mi/mod assist Goal status: ONGOING  5.  Pt will describe objects and pictures by providing at least three salient features as judged by clinician with 90% acc when provided min cues.  Baseline: 80% Goal status: ONGOING  LONG TERM GOALS:  Target date: 12/03/2024  Patient will increase word finding of low frequency info/abstract thoughts with cues using compensatory strategies. Baseline: mild impairment Goal status: ONGOING  2.  Pt will incorporate memory and attention strategies in functional tasks to [facility]  Baseline: mi/mod assist Goal status: ONGOING   ASSESSMENT:  CLINICAL IMPRESSION: from initial evaluation 10/02/2024) Patient is a 64 y.o. male who was seen today for a cognitive linguistic evaluation following referral from Harlene Bogaert, NP.  Pt has a complicated history in terms of onset of his symptoms of reduced attention, memory, and word finding. He has a remote history of a right parietal lobe infarct (~04/2022), anxiety and depression, and possible head injuries from previous falls from 2022 onward. He lives with his wife and he has been responsible for finances, medication management, cleaning, driving, and picking up foods (light cooking only). He reports getting lost while driving, missing bill payments, and poor attention when trying to read. They recently (6 months ago) moved into a 55+ community and this has provided good support for him and his wife. He presents with mild neurocognitive deficits which may be impacted by anxiety and depression. Recommend short term SLP therapy to address attention, memory, thought organization, and word finding deficits. Pt is motivated to address the above and has good family support.   OBJECTIVE IMPAIRMENTS: include attention, memory, executive functioning, expressive language, and receptive language. These impairments are limiting patient from managing medications, managing appointments, managing finances, and effectively communicating at home and in community. Factors affecting potential to achieve goals and functional outcome are ability to learn/carryover information. Patient will benefit from skilled SLP services to address above impairments and improve overall function.  REHAB  POTENTIAL: Good  PLAN:  SLP FREQUENCY: 2x/week  SLP DURATION: 6 weeks  PLANNED INTERVENTIONS: Cueing hierachy, Cognitive reorganization, Internal/external aids, Functional tasks, Multimodal communication approach, SLP instruction and feedback, Compensatory strategies, Patient/family education, [phone] Treatment of speech (30 or 45 min) , and [id number]- Speech [address], [place], Phon, Eval Compre, Express   Thank you,  Lamar Candy, CCC-SLP [phone]  Tyishia Aune, CCC-SLP 10/10/2024, 2:28 PM       "

## 2024-10-12 ENCOUNTER — Ambulatory Visit (HOSPITAL_COMMUNITY): Admitting: Speech Pathology

## 2024-10-12 DIAGNOSIS — R41841 Cognitive communication deficit: Secondary | ICD-10-CM | POA: Diagnosis not present

## 2024-10-12 NOTE — Therapy (Signed)
 " OUTPATIENT SPEECH LANGUAGE PATHOLOGY TREATMENT   Patient Name: Malik Hill MRN: [id number] DOB:June 10, 1961, 64 y.o., male Today's Date: 10/12/2024  PCP: [facility] REFERRING PROVIDER: Whitfield Raisin, NP  END OF SESSION:  End of Session - 10/12/24 0945     Visit Number 3    Number of Visits 9    Date for Recertification  11/09/24    Authorization Type [employer]   Eff: 10/06/23  Oop: 4000  Copay 25  Auth yes   SLP Start Time 0945    SLP Stop Time  1030    SLP Time Calculation (min) 45 min    Activity Tolerance Patient tolerated treatment well          Past Medical History:  Diagnosis Date   Anxiety    BPH (benign prostatic hyperplasia)    Cirrhosis (HCC)    Depression    Diabetes mellitus without complication (HCC)    Headache    History of kidney stones    MDD (major depressive disorder)    Memory loss    Osteoarthritis    Stroke (HCC) 09/2021   Past Surgical History:  Procedure Laterality Date   BIOPSY  06/10/2023   Procedure: BIOPSY;  Surgeon: Malik Deatrice FALCON, MD;  [facility];  Service: Endoscopy;;   BUNIONECTOMY     COLONOSCOPY  06/09/2007   COLONOSCOPY WITH PROPOFOL  N/A 06/10/2023   Procedure: COLONOSCOPY WITH PROPOFOL ;  Surgeon: Malik Deatrice FALCON, MD;  [facility];  Service: Endoscopy;  Laterality: N/A;  9:45am;asa 3   CYSTOSCOPY WITH RETROGRADE PYELOGRAM, URETEROSCOPY AND STENT PLACEMENT Left 01/21/2023   Procedure: CYSTOSCOPY WITH RETROGRADE PYELOGRAM, URETEROSCOPY AND STENT PLACEMENT;  Surgeon: Malik Belvie CROME, MD;  Location: [facility];  Service: Urology;  Laterality: Left;   CYSTOSCOPY WITH RETROGRADE PYELOGRAM, URETEROSCOPY AND STENT PLACEMENT Left 07/01/2023   Procedure: CYSTOSCOPY WITH RETROGRADE PYELOGRAM, URETEROSCOPY AND STENT PLACEMENT;  Surgeon: Malik Belvie CROME, MD;  Location: [facility];  Service: Urology;  Laterality: Left;   CYSTOSCOPY WITH URETHRAL DILATATION N/A 07/03/2024   Procedure: CYSTOSCOPY, WITH  URETHRAL DILATION WITH OPTILUME;  Surgeon: Malik Belvie CROME, MD;  Location: [facility];  Service: Urology;  Laterality: N/A;   ESOPHAGOGASTRODUODENOSCOPY (EGD) WITH PROPOFOL  N/A 06/10/2023   Procedure: ESOPHAGOGASTRODUODENOSCOPY (EGD) WITH PROPOFOL ;  Surgeon: Malik Deatrice FALCON, MD;  [facility];  Service: Endoscopy;  Laterality: N/A;  9:45am;asa 3   HOLMIUM LASER APPLICATION Left 07/01/2023   Procedure: HOLMIUM LASER APPLICATION;  Surgeon: Malik Belvie CROME, MD;  Location: [facility];  Service: Urology;  Laterality: Left;   KNEE SURGERY Left 2015   PENILE ADHESIONS LYSIS     POLYPECTOMY  06/10/2023   Procedure: POLYPECTOMY;  Surgeon: Malik Deatrice FALCON, MD;  [facility];  Service: Endoscopy;;   TONSILLECTOMY AND ADENOIDECTOMY     age 18   Patient Active Problem List   Diagnosis Date Noted   Thrombocytopenia 08/15/2024   Gastric polyp 06/10/2023   Gastritis and gastroduodenitis 06/10/2023   Adenomatous polyp of transverse colon 06/10/2023   Portal hypertension (HCC) 05/18/2023   BMI 32.0-32.9,adult 05/18/2023   Cirrhosis of liver without ascites (HCC) 05/18/2023   Colon cancer screening 05/18/2023   Nephrolithiasis 01/21/2023   Weakness    Bronchitis    SIRS (systemic inflammatory response syndrome) (HCC) 09/25/2021   DM (diabetes mellitus) (HCC) 09/25/2021   Essential hypertension 09/25/2021   Depression 09/25/2021    ONSET DATE: ~2023   REFERRING DIAG: G31.84 (ICD-10-CM) - Mild  cognitive impairment Z86.73 (ICD-10-CM) - History of stroke  THERAPY DIAG:  Cognitive communication deficit  Rationale for Evaluation and Treatment: Rehabilitation  SUBJECTIVE:   SUBJECTIVE STATEMENT: Thank you for listening to me and believing me.  Pt accompanied by: self  PERTINENT HISTORY: Malik Hill is a 64 yo male who was referred by Malik Bogaert, NP (neurology) for a cognitive linguistic evaluation and treatment due to Pt reports of memory loss, confusion, and  word finding difficulties.  Past medical history is significant for osteoarthritis, stroke, memory loss, history of kidney stones, depression, anxiety, liver disease, BPH, hyperlipidemia, migraine headaches, and obesity, with excessive daytime somnolence as well as chronic difficulty initiating and maintaining sleep, DM2, HTN, HLD, generalized anxiety disorder, and an episode of altered mental status back in December, 2022.  PAIN:  Are you having pain? No  FALLS: Has patient fallen in last 6 months?  Yes, Number of falls: 2x, Comment: fell after tripping over curb, fell out of shower, but now has handles  LIVING ENVIRONMENT: Lives with: lives with their spouse Lives in: House/apartment  PLOF:  Level of assistance: Independent with ADLs, Independent with IADLs Employment: Retired  PATIENT GOALS: Improve attention and focus, not get lost while driving  OBJECTIVE:  Note: Objective measures were completed at Evaluation unless otherwise noted.  DIAGNOSTIC FINDINGS:  MRI Brain 04/06/2022 as per radiology report: Impression 1. No evidence of acute intracranial abnormality. 2. Small chronic cortical/subcortical infarct within the medial right parietal lobe. 3. Prominent perivascular space versus chronic lacunar infarct within the right caudate head. 4. Mild generalized cerebral atrophy. 5. Mild bilateral frontal and ethmoid sinus mucosal thickening. 6. Bilateral mastoid effusions.                                                                                                                      Previous Treatment: Malik Hill brought memantine  medication that Malik Hill prescribed during his visit in December. He indicates that he does not yet see any changes. He had a few headaches and he was encouraged to track those. SLP reviewed the evaluation and goals with Pt and provided written memory strategies. Pt asked to identify barriers or problems and then SLP provided assist to generate  possible solutions. He identified two problems: going to the grocery store and forgetting what he was supposed to be buying for his wife and losing his train of thought in conversation. SLP provided education on memory strategies and guided Pt in finding possible solutions (write it down, discuss items with his wife before he leaves, call his wife, take a photo with his phone, voice memo, notes section of his phone, visual tracking of conversations by writing down key words before or during conversations). Pt asked to write down other problems he encounters at home over the next few days. He does not use a written calendar or take notes, but relies on My Chart on his phone. Continue to target goals.   TREATMENT DATE: 10/12/2024 Pt expressed appreciation to SLP for  listening and believing him in regards to the cognitive changes he's noted. He reports feeling more confidence and is implementing strategies we have discussed in sessions. He is using his iPhone to assist with memory (photos, notes, calendar). He skipped his memantine  yesterday and wants to taper off. He was encouraged to call his neurologist to discuss. In session, he completed a 10-item recall and recognition with association cues. He completed the first trial with 4/10 and subsequent trials with 8/10, 9/10, and 10/10. He was then able to recall 7/10 words without association cues. He was given HEP to identify barriers at home. Continue to target goals.  PATIENT EDUCATION: Education details: Review memory strategies and write down barriers or problems he encounters at home Person educated: Patient Education method: Explanation Education comprehension: verbalized understanding   GOALS: Goals reviewed with patient? Yes  SHORT TERM GOALS: Target date: 11/09/2024  Pt will implement memory strategies in functional therapy activities with 90% acc with min cues. Baseline:70% Goal status: ONGOING  2.  Pt will increase recall for requested  information to 90% acc during functional memory exercises with use of compensatory strategies as needed when provided min cues.   Baseline: 50% Goal status: ONGOING  3.  Pt will complete moderately complex attention tasks in session with 90% acc with min assist for use of strategies. Baseline: 75% Goal status: ONGOING  4.  Pt will implement word-finding strategies with 90% accuracy when unable to verbalize desired word in conversation/functional tasks with min assist.    Baseline: mi/mod assist Goal status: ONGOING  5.  Pt will describe objects and pictures by providing at least three salient features as judged by clinician with 90% acc when provided min cues.  Baseline: 80% Goal status: ONGOING  LONG TERM GOALS: Target date: 12/03/2024  Patient will increase word finding of low frequency info/abstract thoughts with cues using compensatory strategies. Baseline: mild impairment Goal status: ONGOING  2.  Pt will incorporate memory and attention strategies in functional tasks to [facility]  Baseline: mi/mod assist Goal status: ONGOING   ASSESSMENT:  CLINICAL IMPRESSION: from initial evaluation 10/02/2024) Patient is a 64 y.o. male who was seen today for a cognitive linguistic evaluation following referral from Malik Bogaert, NP.  Pt has a complicated history in terms of onset of his symptoms of reduced attention, memory, and word finding. He has a remote history of a right parietal lobe infarct (~04/2022), anxiety and depression, and possible head injuries from previous falls from 2022 onward. He lives with his wife and he has been responsible for finances, medication management, cleaning, driving, and picking up foods (light cooking only). He reports getting lost while driving, missing bill payments, and poor attention when trying to read. They recently (6 months ago) moved into a 55+ community and this has provided good support for him and his wife. He presents with mild neurocognitive deficits  which may be impacted by anxiety and depression. Recommend short term SLP therapy to address attention, memory, thought organization, and word finding deficits. Pt is motivated to address the above and has good family support.   OBJECTIVE IMPAIRMENTS: include attention, memory, executive functioning, expressive language, and receptive language. These impairments are limiting patient from managing medications, managing appointments, managing finances, and effectively communicating at home and in community. Factors affecting potential to achieve goals and functional outcome are ability to learn/carryover information. Patient will benefit from skilled SLP services to address above impairments and improve overall function.  REHAB POTENTIAL: Good  PLAN:  SLP FREQUENCY:  2x/week  SLP DURATION: 6 weeks  PLANNED INTERVENTIONS: Cueing hierachy, Cognitive reorganization, Internal/external aids, Functional tasks, Multimodal communication approach, SLP instruction and feedback, Compensatory strategies, Patient/family education, [phone] Treatment of speech (30 or 45 min) , and [id number]- Speech [address], Artic, Phon, Eval Compre, Express   Thank you,  Lamar Candy, CCC-SLP [phone]  Lekita Kerekes, CCC-SLP 10/12/2024, 9:47 AM       "

## 2024-10-17 ENCOUNTER — Encounter (HOSPITAL_COMMUNITY): Payer: Self-pay | Admitting: Speech Pathology

## 2024-10-17 ENCOUNTER — Ambulatory Visit (HOSPITAL_COMMUNITY): Admitting: Speech Pathology

## 2024-10-17 DIAGNOSIS — R41841 Cognitive communication deficit: Secondary | ICD-10-CM

## 2024-10-17 NOTE — Therapy (Signed)
 " OUTPATIENT SPEECH LANGUAGE PATHOLOGY TREATMENT   Patient Name: Malik Hill MRN: [id number] DOB:03/15/1961, 64 y.o., male Today's Date: 10/17/2024  PCP: [facility] REFERRING PROVIDER: Whitfield Raisin, NP  END OF SESSION:  End of Session - 10/17/24 1511     Visit Number 4    Number of Visits 9    Date for Recertification  11/09/24    Authorization Type [employer]   Eff: 10/06/23  Oop: 4000  Copay 25  Auth yes   SLP Start Time 1506    SLP Stop Time  1546    SLP Time Calculation (min) 40 min    Activity Tolerance Patient tolerated treatment well          Past Medical History:  Diagnosis Date   Anxiety    BPH (benign prostatic hyperplasia)    Cirrhosis (HCC)    Depression    Diabetes mellitus without complication (HCC)    Headache    History of kidney stones    MDD (major depressive disorder)    Memory loss    Osteoarthritis    Stroke (HCC) 09/2021   Past Surgical History:  Procedure Laterality Date   BIOPSY  06/10/2023   Procedure: BIOPSY;  Surgeon: Cinderella Deatrice FALCON, MD;  [facility];  Service: Endoscopy;;   BUNIONECTOMY     COLONOSCOPY  06/09/2007   COLONOSCOPY WITH PROPOFOL  N/A 06/10/2023   Procedure: COLONOSCOPY WITH PROPOFOL ;  Surgeon: Cinderella Deatrice FALCON, MD;  [facility];  Service: Endoscopy;  Laterality: N/A;  9:45am;asa 3   CYSTOSCOPY WITH RETROGRADE PYELOGRAM, URETEROSCOPY AND STENT PLACEMENT Left 01/21/2023   Procedure: CYSTOSCOPY WITH RETROGRADE PYELOGRAM, URETEROSCOPY AND STENT PLACEMENT;  Surgeon: Sherrilee Belvie CROME, MD;  Location: [facility];  Service: Urology;  Laterality: Left;   CYSTOSCOPY WITH RETROGRADE PYELOGRAM, URETEROSCOPY AND STENT PLACEMENT Left 07/01/2023   Procedure: CYSTOSCOPY WITH RETROGRADE PYELOGRAM, URETEROSCOPY AND STENT PLACEMENT;  Surgeon: Sherrilee Belvie CROME, MD;  Location: [facility];  Service: Urology;  Laterality: Left;   CYSTOSCOPY WITH URETHRAL DILATATION N/A 07/03/2024   Procedure: CYSTOSCOPY,  WITH URETHRAL DILATION WITH OPTILUME;  Surgeon: Sherrilee Belvie CROME, MD;  Location: [facility];  Service: Urology;  Laterality: N/A;   ESOPHAGOGASTRODUODENOSCOPY (EGD) WITH PROPOFOL  N/A 06/10/2023   Procedure: ESOPHAGOGASTRODUODENOSCOPY (EGD) WITH PROPOFOL ;  Surgeon: Cinderella Deatrice FALCON, MD;  [facility];  Service: Endoscopy;  Laterality: N/A;  9:45am;asa 3   HOLMIUM LASER APPLICATION Left 07/01/2023   Procedure: HOLMIUM LASER APPLICATION;  Surgeon: Sherrilee Belvie CROME, MD;  Location: [facility];  Service: Urology;  Laterality: Left;   KNEE SURGERY Left 2015   PENILE ADHESIONS LYSIS     POLYPECTOMY  06/10/2023   Procedure: POLYPECTOMY;  Surgeon: Cinderella Deatrice FALCON, MD;  [facility];  Service: Endoscopy;;   TONSILLECTOMY AND ADENOIDECTOMY     age 11   Patient Active Problem List   Diagnosis Date Noted   Thrombocytopenia 08/15/2024   Gastric polyp 06/10/2023   Gastritis and gastroduodenitis 06/10/2023   Adenomatous polyp of transverse colon 06/10/2023   Portal hypertension (HCC) 05/18/2023   BMI 32.0-32.9,adult 05/18/2023   Cirrhosis of liver without ascites (HCC) 05/18/2023   Colon cancer screening 05/18/2023   Nephrolithiasis 01/21/2023   Weakness    Bronchitis    SIRS (systemic inflammatory response syndrome) (HCC) 09/25/2021   DM (diabetes mellitus) (HCC) 09/25/2021   Essential hypertension 09/25/2021   Depression 09/25/2021    ONSET DATE: ~2023   REFERRING DIAG: G31.84 (ICD-10-CM) - Mild  cognitive impairment Z86.73 (ICD-10-CM) - History of stroke  THERAPY DIAG:  Cognitive communication deficit  Rationale for Evaluation and Treatment: Rehabilitation  SUBJECTIVE:   SUBJECTIVE STATEMENT: I have trouble with commitments due to my anxiety.  Pt accompanied by: self  PERTINENT HISTORY: Malik Hill is a 64 yo male who was referred by Harlene Bogaert, NP (neurology) for a cognitive linguistic evaluation and treatment due to Pt reports of memory loss,  confusion, and word finding difficulties.  Past medical history is significant for osteoarthritis, stroke, memory loss, history of kidney stones, depression, anxiety, liver disease, BPH, hyperlipidemia, migraine headaches, and obesity, with excessive daytime somnolence as well as chronic difficulty initiating and maintaining sleep, DM2, HTN, HLD, generalized anxiety disorder, and an episode of altered mental status back in December, 2022.  PAIN:  Are you having pain? No  FALLS: Has patient fallen in last 6 months?  Yes, Number of falls: 2x, Comment: fell after tripping over curb, fell out of shower, but now has handles  LIVING ENVIRONMENT: Lives with: lives with their spouse Lives in: House/apartment  PLOF:  Level of assistance: Independent with ADLs, Independent with IADLs Employment: Retired  PATIENT GOALS: Improve attention and focus, not get lost while driving  OBJECTIVE:  Note: Objective measures were completed at Evaluation unless otherwise noted.  DIAGNOSTIC FINDINGS:  MRI Brain 04/06/2022 as per radiology report: Impression 1. No evidence of acute intracranial abnormality. 2. Small chronic cortical/subcortical infarct within the medial right parietal lobe. 3. Prominent perivascular space versus chronic lacunar infarct within the right caudate head. 4. Mild generalized cerebral atrophy. 5. Mild bilateral frontal and ethmoid sinus mucosal thickening. 6. Bilateral mastoid effusions.                                                                                                                      Previous Treatment: Malik Hill brought memantine  medication that Harlene Bogaert prescribed during his visit in December. He indicates that he does not yet see any changes. He had a few headaches and he was encouraged to track those. SLP reviewed the evaluation and goals with Pt and provided written memory strategies. Pt asked to identify barriers or problems and then SLP provided assist  to generate possible solutions. He identified two problems: going to the grocery store and forgetting what he was supposed to be buying for his wife and losing his train of thought in conversation. SLP provided education on memory strategies and guided Pt in finding possible solutions (write it down, discuss items with his wife before he leaves, call his wife, take a photo with his phone, voice memo, notes section of his phone, visual tracking of conversations by writing down key words before or during conversations). Pt asked to write down other problems he encounters at home over the next few days. He does not use a written calendar or take notes, but relies on My Chart on his phone. Continue to target goals.   Previous Treatment: Pt expressed appreciation to SLP for listening  and believing him in regards to the cognitive changes he's noted. He reports feeling more confidence and is implementing strategies we have discussed in sessions. He is using his iPhone to assist with memory (photos, notes, calendar). He skipped his memantine  yesterday and wants to taper off. He was encouraged to call his neurologist to discuss. In session, he completed a 10-item recall and recognition with association cues. He completed the first trial with 4/10 and subsequent trials with 8/10, 9/10, and 10/10. He was then able to recall 7/10 words without association cues. He was given HEP to identify barriers at home. Continue to target goals.  TREATMENT DATE: 10/17/2024 Malik Hill states that he decided to continue taking memantine  and his wife notes a reduction in his manic behavior or excessive talking and he reports feeling more alert. He was asked to identify other barriers at home and he stated that he fears committing to events/activities because of his anxiety. He did not go to church on Sunday because of that and reports feeling more comfortable in smaller group settings. SLP encouraged him to give himself grace to let go of  events that are socially challenging for him. He has not had any memory lapses that he can identify since our last session. He described low frequency words with 100% acc in barrier task to SLP with only rare min cues to expand upon descriptors when SLP was unable to ascertain the word. Next session, will complete recall of paragraph length material.   PATIENT EDUCATION: Education details: Review memory strategies and write down barriers or problems he encounters at home Person educated: Patient Education method: Explanation Education comprehension: verbalized understanding   GOALS: Goals reviewed with patient? Yes  SHORT TERM GOALS: Target date: 11/09/2024  Pt will implement memory strategies in functional therapy activities with 90% acc with min cues. Baseline:70% Goal status: ONGOING  2.  Pt will increase recall for requested information to 90% acc during functional memory exercises with use of compensatory strategies as needed when provided min cues.   Baseline: 50% Goal status: ONGOING  3.  Pt will complete moderately complex attention tasks in session with 90% acc with min assist for use of strategies. Baseline: 75% Goal status: ONGOING  4.  Pt will implement word-finding strategies with 90% accuracy when unable to verbalize desired word in conversation/functional tasks with min assist.    Baseline: mi/mod assist Goal status: ONGOING  5.  Pt will describe objects and pictures by providing at least three salient features as judged by clinician with 90% acc when provided min cues.  Baseline: 80% Goal status: ONGOING  LONG TERM GOALS: Target date: 12/03/2024  Patient will increase word finding of low frequency info/abstract thoughts with cues using compensatory strategies. Baseline: mild impairment Goal status: ONGOING  2.  Pt will incorporate memory and attention strategies in functional tasks to [facility]  Baseline: mi/mod assist Goal status:  ONGOING   ASSESSMENT:  CLINICAL IMPRESSION: from initial evaluation 10/02/2024) Patient is a 64 y.o. male who was seen today for a cognitive linguistic evaluation following referral from Harlene Bogaert, NP.  Pt has a complicated history in terms of onset of his symptoms of reduced attention, memory, and word finding. He has a remote history of a right parietal lobe infarct (~04/2022), anxiety and depression, and possible head injuries from previous falls from 2022 onward. He lives with his wife and he has been responsible for finances, medication management, cleaning, driving, and picking up foods (light cooking only). He reports getting lost  while driving, missing bill payments, and poor attention when trying to read. They recently (6 months ago) moved into a 55+ community and this has provided good support for him and his wife. He presents with mild neurocognitive deficits which may be impacted by anxiety and depression. Recommend short term SLP therapy to address attention, memory, thought organization, and word finding deficits. Pt is motivated to address the above and has good family support.   OBJECTIVE IMPAIRMENTS: include attention, memory, executive functioning, expressive language, and receptive language. These impairments are limiting patient from managing medications, managing appointments, managing finances, and effectively communicating at home and in community. Factors affecting potential to achieve goals and functional outcome are ability to learn/carryover information. Patient will benefit from skilled SLP services to address above impairments and improve overall function.  REHAB POTENTIAL: Good  PLAN:  SLP FREQUENCY: 2x/week  SLP DURATION: 6 weeks  PLANNED INTERVENTIONS: Cueing hierachy, Cognitive reorganization, Internal/external aids, Functional tasks, Multimodal communication approach, SLP instruction and feedback, Compensatory strategies, Patient/family education, [phone]  Treatment of speech (30 or 45 min) , and [id number]- Speech [address], Artic, Phon, Eval Compre, Express   Thank you,  Lamar Candy, CCC-SLP [phone]  Zeriah Baysinger, CCC-SLP 10/17/2024, 3:12 PM       "

## 2024-10-19 ENCOUNTER — Encounter (HOSPITAL_COMMUNITY): Payer: Self-pay | Admitting: Speech Pathology

## 2024-10-19 ENCOUNTER — Ambulatory Visit (HOSPITAL_COMMUNITY): Admitting: Speech Pathology

## 2024-10-19 DIAGNOSIS — R41841 Cognitive communication deficit: Secondary | ICD-10-CM

## 2024-10-19 NOTE — Therapy (Signed)
 " OUTPATIENT SPEECH LANGUAGE PATHOLOGY TREATMENT   Patient Name: Malik Hill MRN: [id number] DOB:[birth year], 64 y.o., male Today's Date: 10/19/2024  PCP: [facility] REFERRING PROVIDER: Whitfield Raisin, NP  END OF SESSION:  End of Session - 10/19/24 1039     Visit Number 5    Number of Visits 9    Date for Recertification  11/09/24    Authorization Type [employer]   Eff: 10/06/23  Oop: 4000  Copay 25  Auth yes   SLP Start Time 1030    SLP Stop Time  1115    SLP Time Calculation (min) 45 min    Activity Tolerance Patient tolerated treatment well          Past Medical History:  Diagnosis Date   Anxiety    BPH (benign prostatic hyperplasia)    Cirrhosis (HCC)    Depression    Diabetes mellitus without complication (HCC)    Headache    History of kidney stones    MDD (major depressive disorder)    Memory loss    Osteoarthritis    Stroke (HCC) 09/2021   Past Surgical History:  Procedure Laterality Date   BIOPSY  06/10/2023   Procedure: BIOPSY;  Surgeon: Cinderella Deatrice FALCON, MD;  [facility]: [facility];  Service: Endoscopy;;   BUNIONECTOMY     COLONOSCOPY  06/09/2007   COLONOSCOPY WITH PROPOFOL  N/A 06/10/2023   Procedure: COLONOSCOPY WITH PROPOFOL ;  Surgeon: Cinderella Deatrice FALCON, MD;  Location: [facility];  Service: Endoscopy;  Laterality: N/A;  9:45am;asa 3   CYSTOSCOPY WITH RETROGRADE PYELOGRAM, URETEROSCOPY AND STENT PLACEMENT Left 01/21/2023   Procedure: CYSTOSCOPY WITH RETROGRADE PYELOGRAM, URETEROSCOPY AND STENT PLACEMENT;  Surgeon: Sherrilee Belvie CROME, MD;  Location: [facility];  Service: Urology;  Laterality: Left;   CYSTOSCOPY WITH RETROGRADE PYELOGRAM, URETEROSCOPY AND STENT PLACEMENT Left 07/01/2023   Procedure: CYSTOSCOPY WITH RETROGRADE PYELOGRAM, URETEROSCOPY AND STENT PLACEMENT;  Surgeon: Sherrilee Belvie CROME, MD;  Location: [facility];  Service: Urology;  Laterality: Left;   CYSTOSCOPY WITH URETHRAL DILATATION N/A 07/03/2024   Procedure: CYSTOSCOPY,  WITH URETHRAL DILATION WITH OPTILUME;  Surgeon: Sherrilee Belvie CROME, MD;  Location: [facility];  Service: Urology;  Laterality: N/A;   ESOPHAGOGASTRODUODENOSCOPY (EGD) WITH PROPOFOL  N/A 06/10/2023   Procedure: ESOPHAGOGASTRODUODENOSCOPY (EGD) WITH PROPOFOL ;  Surgeon: Cinderella Deatrice FALCON, MD;  [facility]: [facility];  Service: Endoscopy;  Laterality: N/A;  9:45am;asa 3   HOLMIUM LASER APPLICATION Left 07/01/2023   Procedure: HOLMIUM LASER APPLICATION;  Surgeon: Sherrilee Belvie CROME, MD;  Location: [facility];  Service: Urology;  Laterality: Left;   KNEE SURGERY Left 2015   PENILE ADHESIONS LYSIS     POLYPECTOMY  06/10/2023   Procedure: POLYPECTOMY;  Surgeon: Cinderella Deatrice FALCON, MD;  Location: [facility];  Service: Endoscopy;;   TONSILLECTOMY AND ADENOIDECTOMY     age 1   Patient Active Problem List   Diagnosis Date Noted   Thrombocytopenia 08/15/2024   Gastric polyp 06/10/2023   Gastritis and gastroduodenitis 06/10/2023   Adenomatous polyp of transverse colon 06/10/2023   Portal hypertension (HCC) 05/18/2023   BMI 32.0-32.9,adult 05/18/2023   Cirrhosis of liver without ascites (HCC) 05/18/2023   Colon cancer screening 05/18/2023   Nephrolithiasis 01/21/2023   Weakness    Bronchitis    SIRS (systemic inflammatory response syndrome) (HCC) 09/25/2021   DM (diabetes mellitus) (HCC) 09/25/2021   Essential hypertension 09/25/2021   Depression 09/25/2021    ONSET DATE: ~2023   REFERRING DIAG: G31.84 (ICD-10-CM) - Mild  cognitive impairment Z86.73 (ICD-10-CM) - History of stroke  THERAPY DIAG:  Cognitive communication deficit  Rationale for Evaluation and Treatment: Rehabilitation  SUBJECTIVE:   SUBJECTIVE STATEMENT: How can people not be stressed with the world today?  Pt accompanied by: self  PERTINENT HISTORY: [name] is a 64 yo male who was referred by Harlene Bogaert, NP (neurology) for a cognitive linguistic evaluation and treatment due to Pt reports of memory loss,  confusion, and word finding difficulties.  Past medical history is significant for osteoarthritis, stroke, memory loss, history of kidney stones, depression, anxiety, liver disease, BPH, hyperlipidemia, migraine headaches, and obesity, with excessive daytime somnolence as well as chronic difficulty initiating and maintaining sleep, DM2, HTN, HLD, generalized anxiety disorder, and an episode of altered mental status back in December, 2022.  PAIN:  Are you having pain? No  FALLS: Has patient fallen in last 6 months?  Yes, Number of falls: 2x, Comment: fell after tripping over curb, fell out of shower, but now has handles  LIVING ENVIRONMENT: Lives with: lives with their spouse Lives in: House/apartment  PLOF:  Level of assistance: Independent with ADLs, Independent with IADLs Employment: Retired  PATIENT GOALS: Improve attention and focus, not get lost while driving  OBJECTIVE:  Note: Objective measures were completed at Evaluation unless otherwise noted.  DIAGNOSTIC FINDINGS:  MRI Brain 04/06/2022 as per radiology report: Impression 1. No evidence of acute intracranial abnormality. 2. Small chronic cortical/subcortical infarct within the medial right parietal lobe. 3. Prominent perivascular space versus chronic lacunar infarct within the right caudate head. 4. Mild generalized cerebral atrophy. 5. Mild bilateral frontal and ethmoid sinus mucosal thickening. 6. Bilateral mastoid effusions.                                                                                                                      Previous Treatment: [name] brought memantine  medication that Harlene Bogaert prescribed during his visit in December. He indicates that he does not yet see any changes. He had a few headaches and he was encouraged to track those. SLP reviewed the evaluation and goals with Pt and provided written memory strategies. Pt asked to identify barriers or problems and then SLP provided assist  to generate possible solutions. He identified two problems: going to the grocery store and forgetting what he was supposed to be buying for his wife and losing his train of thought in conversation. SLP provided education on memory strategies and guided Pt in finding possible solutions (write it down, discuss items with his wife before he leaves, call his wife, take a photo with his phone, voice memo, notes section of his phone, visual tracking of conversations by writing down key words before or during conversations). Pt asked to write down other problems he encounters at home over the next few days. He does not use a written calendar or take notes, but relies on My Chart on his phone. Continue to target goals.   Previous Treatment: Pt expressed appreciation to SLP for  listening and believing him in regards to the cognitive changes he's noted. He reports feeling more confidence and is implementing strategies we have discussed in sessions. He is using his iPhone to assist with memory (photos, notes, calendar). He skipped his memantine  yesterday and wants to taper off. He was encouraged to call his neurologist to discuss. In session, he completed a 10-item recall and recognition with association cues. He completed the first trial with 4/10 and subsequent trials with 8/10, 9/10, and 10/10. He was then able to recall 7/10 words without association cues. He was given HEP to identify barriers at home. Continue to target goals.  Previous Treatment: [name] states that he decided to continue taking memantine  and his wife notes a reduction in his manic behavior or excessive talking and he reports feeling more alert. He was asked to identify other barriers at home and he stated that he fears committing to events/activities because of his anxiety. He did not go to church on Sunday because of that and reports feeling more comfortable in smaller group settings. SLP encouraged him to give himself grace to let go of  events that are socially challenging for him. He has not had any memory lapses that he can identify since our last session. He described low frequency words with 100% acc in barrier task to SLP with only rare min cues to expand upon descriptors when SLP was unable to ascertain the word. Next session, will complete recall of paragraph length material.   TREATMENT DATE: 10/19/24 [name] continues to report increased confidence and seemingly good tolerance to memantine . In session, he was asked to study a dynamic picture scene (outdoor pool scene) and then describe in detail to SLP while drawing on paper according to his description, and then cover the paper and answer recall questions. SLP drew according to his directions with only minor error for placement of items. He acknowledges that providing accurate description of initial placement would have helped. He then answered recall questions with 100% acc. SLP provided written list of memory/coding systems: visual, auditory, and kinesthetic and reviewed with Pt to ascertain how he learns best. He reports identifying with visual and auditory systems best. He tends to make up a story in his mind and associate with something personal to him to assist in recall. SLP discussed the impact of stress on cognition and he indicates that he tends to spiral or ruminate on perceptions in his mind. SLP suggested that he try to journal on paper or on his phone (can voice record also) when he does this to try and calm his thoughts. He was given a paper to record one memory for day as a start/bridge. Continue plan of care.   PATIENT EDUCATION: Education details: Review memory strategies and write down barriers or problems he encounters at home Person educated: Patient Education method: Explanation Education comprehension: verbalized understanding   GOALS: Goals reviewed with patient? Yes  SHORT TERM GOALS: Target date: 11/09/2024  Pt will implement memory strategies in  functional therapy activities with 90% acc with min cues. Baseline:70% Goal status: ONGOING  2.  Pt will increase recall for requested information to 90% acc during functional memory exercises with use of compensatory strategies as needed when provided min cues.   Baseline: 50% Goal status: ONGOING  3.  Pt will complete moderately complex attention tasks in session with 90% acc with min assist for use of strategies. Baseline: 75% Goal status: ONGOING  4.  Pt will implement word-finding strategies with 90% accuracy  when unable to verbalize desired word in conversation/functional tasks with min assist.    Baseline: mi/mod assist Goal status: ONGOING  5.  Pt will describe objects and pictures by providing at least three salient features as judged by clinician with 90% acc when provided min cues.  Baseline: 80% Goal status: ONGOING  LONG TERM GOALS: Target date: 12/03/2024  Patient will increase word finding of low frequency info/abstract thoughts with cues using compensatory strategies. Baseline: mild impairment Goal status: ONGOING  2.  Pt will incorporate memory and attention strategies in functional tasks to [facility]  Baseline: mi/mod assist Goal status: ONGOING   ASSESSMENT:  CLINICAL IMPRESSION: from initial evaluation 10/02/2024) Patient is a 64 y.o. male who was seen today for a cognitive linguistic evaluation following referral from Harlene Bogaert, NP.  Pt has a complicated history in terms of onset of his symptoms of reduced attention, memory, and word finding. He has a remote history of a right parietal lobe infarct (~04/2022), anxiety and depression, and possible head injuries from previous falls from 2022 onward. He lives with his wife and he has been responsible for finances, medication management, cleaning, driving, and picking up foods (light cooking only). He reports getting lost while driving, missing bill payments, and poor attention when trying to read. They recently (6  months ago) moved into a 55+ community and this has provided good support for him and his wife. He presents with mild neurocognitive deficits which may be impacted by anxiety and depression. Recommend short term SLP therapy to address attention, memory, thought organization, and word finding deficits. Pt is motivated to address the above and has good family support.   OBJECTIVE IMPAIRMENTS: include attention, memory, executive functioning, expressive language, and receptive language. These impairments are limiting patient from managing medications, managing appointments, managing finances, and effectively communicating at home and in community. Factors affecting potential to achieve goals and functional outcome are ability to learn/carryover information. Patient will benefit from skilled SLP services to address above impairments and improve overall function.  REHAB POTENTIAL: Good  PLAN:  SLP FREQUENCY: 2x/week  SLP DURATION: 6 weeks  PLANNED INTERVENTIONS: Cueing hierachy, Cognitive reorganization, Internal/external aids, Functional tasks, Multimodal communication approach, SLP instruction and feedback, Compensatory strategies, Patient/family education, [phone] Treatment of speech (30 or 45 min) , and [id number]- Speech [address], Artic, Phon, Eval Compre, Express   Thank you,  Lamar Candy, CCC-SLP [phone]  Deepa Barthel, CCC-SLP 10/19/2024, 10:40 AM       "

## 2024-10-23 ENCOUNTER — Encounter: Payer: Self-pay | Admitting: Psychology

## 2024-10-24 ENCOUNTER — Ambulatory Visit (INDEPENDENT_AMBULATORY_CARE_PROVIDER_SITE_OTHER): Admitting: Speech Pathology

## 2024-10-24 DIAGNOSIS — R41841 Cognitive communication deficit: Secondary | ICD-10-CM

## 2024-10-24 NOTE — Therapy (Signed)
 " OUTPATIENT SPEECH LANGUAGE PATHOLOGY TREATMENT   Patient Name: Malik Hill MRN: [id number] DOB:1961-08-04, 64 y.o., male Today's Date: 10/24/2024  PCP: [facility] REFERRING PROVIDER: Whitfield Raisin, NP  END OF SESSION:  End of Session - 10/24/24 1340     Visit Number 6    Number of Visits 9    Date for Recertification  11/09/24    Authorization Type [employer]   Eff: 10/06/23  Oop: 4000  Copay 25  Auth yes   SLP Start Time 1335    SLP Stop Time  1415    SLP Time Calculation (min) 40 min    Activity Tolerance Patient tolerated treatment well          Past Medical History:  Diagnosis Date   Anxiety    BPH (benign prostatic hyperplasia)    Cirrhosis (HCC)    Depression    Diabetes mellitus without complication (HCC)    Headache    History of kidney stones    MDD (major depressive disorder)    Memory loss    Osteoarthritis    Stroke (HCC) 09/2021   Past Surgical History:  Procedure Laterality Date   BIOPSY  06/10/2023   Procedure: BIOPSY;  Surgeon: Cinderella Deatrice FALCON, MD;  [facility]: [facility];  Service: Endoscopy;;   BUNIONECTOMY     COLONOSCOPY  06/09/2007   COLONOSCOPY WITH PROPOFOL  N/A 06/10/2023   Procedure: COLONOSCOPY WITH PROPOFOL ;  Surgeon: Cinderella Deatrice FALCON, MD;  Location: [facility];  Service: Endoscopy;  Laterality: N/A;  9:45am;asa 3   CYSTOSCOPY WITH RETROGRADE PYELOGRAM, URETEROSCOPY AND STENT PLACEMENT Left 01/21/2023   Procedure: CYSTOSCOPY WITH RETROGRADE PYELOGRAM, URETEROSCOPY AND STENT PLACEMENT;  Surgeon: Sherrilee Belvie CROME, MD;  Location: [facility];  Service: Urology;  Laterality: Left;   CYSTOSCOPY WITH RETROGRADE PYELOGRAM, URETEROSCOPY AND STENT PLACEMENT Left 07/01/2023   Procedure: CYSTOSCOPY WITH RETROGRADE PYELOGRAM, URETEROSCOPY AND STENT PLACEMENT;  Surgeon: Sherrilee Belvie CROME, MD;  Location: [facility];  Service: Urology;  Laterality: Left;   CYSTOSCOPY WITH URETHRAL DILATATION N/A 07/03/2024   Procedure: CYSTOSCOPY,  WITH URETHRAL DILATION WITH OPTILUME;  Surgeon: Sherrilee Belvie CROME, MD;  Location: [facility];  Service: Urology;  Laterality: N/A;   ESOPHAGOGASTRODUODENOSCOPY (EGD) WITH PROPOFOL  N/A 06/10/2023   Procedure: ESOPHAGOGASTRODUODENOSCOPY (EGD) WITH PROPOFOL ;  Surgeon: Cinderella Deatrice FALCON, MD;  [facility]: [facility];  Service: Endoscopy;  Laterality: N/A;  9:45am;asa 3   HOLMIUM LASER APPLICATION Left 07/01/2023   Procedure: HOLMIUM LASER APPLICATION;  Surgeon: Sherrilee Belvie CROME, MD;  Location: [facility];  Service: Urology;  Laterality: Left;   KNEE SURGERY Left 2015   PENILE ADHESIONS LYSIS     POLYPECTOMY  06/10/2023   Procedure: POLYPECTOMY;  Surgeon: Cinderella Deatrice FALCON, MD;  Location: [facility];  Service: Endoscopy;;   TONSILLECTOMY AND ADENOIDECTOMY     age 51   Patient Active Problem List   Diagnosis Date Noted   Thrombocytopenia 08/15/2024   Gastric polyp 06/10/2023   Gastritis and gastroduodenitis 06/10/2023   Adenomatous polyp of transverse colon 06/10/2023   Portal hypertension (HCC) 05/18/2023   BMI 32.0-32.9,adult 05/18/2023   Cirrhosis of liver without ascites (HCC) 05/18/2023   Colon cancer screening 05/18/2023   Nephrolithiasis 01/21/2023   Weakness    Bronchitis    SIRS (systemic inflammatory response syndrome) (HCC) 09/25/2021   DM (diabetes mellitus) (HCC) 09/25/2021   Essential hypertension 09/25/2021   Depression 09/25/2021    ONSET DATE: ~2023   REFERRING DIAG: G31.84 (ICD-10-CM) - Mild  cognitive impairment Z86.73 (ICD-10-CM) - History of stroke  THERAPY DIAG:  Cognitive communication deficit  Rationale for Evaluation and Treatment: Rehabilitation  SUBJECTIVE:   SUBJECTIVE STATEMENT: I haven't felt well; since Saturday.  Pt accompanied by: self  PERTINENT HISTORY: Malik Hill is a 64 yo male who was referred by Harlene Bogaert, NP (neurology) for a cognitive linguistic evaluation and treatment due to Pt reports of memory loss, confusion, and word  finding difficulties.  Past medical history is significant for osteoarthritis, stroke, memory loss, history of kidney stones, depression, anxiety, liver disease, BPH, hyperlipidemia, migraine headaches, and obesity, with excessive daytime somnolence as well as chronic difficulty initiating and maintaining sleep, DM2, HTN, HLD, generalized anxiety disorder, and an episode of altered mental status back in December, 2022.  PAIN:  Are you having pain? No  FALLS: Has patient fallen in last 6 months?  Yes, Number of falls: 2x, Comment: fell after tripping over curb, fell out of shower, but now has handles  LIVING ENVIRONMENT: Lives with: lives with their spouse Lives in: House/apartment  PLOF:  Level of assistance: Independent with ADLs, Independent with IADLs Employment: Retired  PATIENT GOALS: Improve attention and focus, not get lost while driving  OBJECTIVE:  Note: Objective measures were completed at Evaluation unless otherwise noted.  DIAGNOSTIC FINDINGS:  MRI Brain 04/06/2022 as per radiology report: Impression 1. No evidence of acute intracranial abnormality. 2. Small chronic cortical/subcortical infarct within the medial right parietal lobe. 3. Prominent perivascular space versus chronic lacunar infarct within the right caudate head. 4. Mild generalized cerebral atrophy. 5. Mild bilateral frontal and ethmoid sinus mucosal thickening. 6. Bilateral mastoid effusions.                                                                                                                      Previous Treatment: [name] brought memantine  medication that Harlene Bogaert prescribed during his visit in December. He indicates that he does not yet see any changes. He had a few headaches and he was encouraged to track those. SLP reviewed the evaluation and goals with Pt and provided written memory strategies. Pt asked to identify barriers or problems and then SLP provided assist to generate possible  solutions. He identified two problems: going to the grocery store and forgetting what he was supposed to be buying for his wife and losing his train of thought in conversation. SLP provided education on memory strategies and guided Pt in finding possible solutions (write it down, discuss items with his wife before he leaves, call his wife, take a photo with his phone, voice memo, notes section of his phone, visual tracking of conversations by writing down key words before or during conversations). Pt asked to write down other problems he encounters at home over the next few days. He does not use a written calendar or take notes, but relies on My Chart on his phone. Continue to target goals.   Previous Treatment: Pt expressed appreciation to SLP for listening and believing him  in regards to the cognitive changes he's noted. He reports feeling more confidence and is implementing strategies we have discussed in sessions. He is using his iPhone to assist with memory (photos, notes, calendar). He skipped his memantine  yesterday and wants to taper off. He was encouraged to call his neurologist to discuss. In session, he completed a 10-item recall and recognition with association cues. He completed the first trial with 4/10 and subsequent trials with 8/10, 9/10, and 10/10. He was then able to recall 7/10 words without association cues. He was given HEP to identify barriers at home. Continue to target goals.  Previous Treatment: [name] states that he decided to continue taking memantine  and his wife notes a reduction in his manic behavior or excessive talking and he reports feeling more alert. He was asked to identify other barriers at home and he stated that he fears committing to events/activities because of his anxiety. He did not go to church on Sunday because of that and reports feeling more comfortable in smaller group settings. SLP encouraged him to give himself grace to let go of events that are socially  challenging for him. He has not had any memory lapses that he can identify since our last session. He described low frequency words with 100% acc in barrier task to SLP with only rare min cues to expand upon descriptors when SLP was unable to ascertain the word. Next session, will complete recall of paragraph length material.   Previous Treatment: [name] continues to report increased confidence and seemingly good tolerance to memantine . In session, he was asked to study a dynamic picture scene (outdoor pool scene) and then describe in detail to SLP while drawing on paper according to his description, and then cover the paper and answer recall questions. SLP drew according to his directions with only minor error for placement of items. He acknowledges that providing accurate description of initial placement would have helped. He then answered recall questions with 100% acc. SLP provided written list of memory/coding systems: visual, auditory, and kinesthetic and reviewed with Pt to ascertain how he learns best. He reports identifying with visual and auditory systems best. He tends to make up a story in his mind and associate with something personal to him to assist in recall. SLP discussed the impact of stress on cognition and he indicates that he tends to spiral or ruminate on perceptions in his mind. SLP suggested that he try to journal on paper or on his phone (can voice record also) when he does this to try and calm his thoughts. He was given a paper to record one memory for day as a start/bridge. Continue plan of care.  TREATMENT DATE: 10/24/24 Pt reports feeling a little off since Saturday after he attended a funeral. He reports feeling like he has lost his confidence. He is still taking the Namenda  and feels conflicted about whether to continue taking it. In our session, we did a quick search to find that sometimes affects may take as long as 3 months. He was encouraged to record/track his feelings at  this time and any potential side effects. In session, he was shown four paired shapes and asked to make an association between the pairings to help him remember the objects. He required min cues to create associations and then was able to recall 4/4 pairings and then draw the corresponding match with 95% acc. He completed a story recall task with 90% acc when asked questions after SLP read him the story. Pt  reports that he has an appointment with a neuropsychologist in early March. Continue to target goals.    PATIENT EDUCATION: Education details: Record daily memories Person educated: Patient Education method: Explanation Education comprehension: verbalized understanding  GOALS: Goals reviewed with patient? Yes  SHORT TERM GOALS: Target date: 11/09/2024  Pt will implement memory strategies in functional therapy activities with 90% acc with min cues. Baseline:70% Goal status: ONGOING  2.  Pt will increase recall for requested information to 90% acc during functional memory exercises with use of compensatory strategies as needed when provided min cues.   Baseline: 50% Goal status: ONGOING  3.  Pt will complete moderately complex attention tasks in session with 90% acc with min assist for use of strategies. Baseline: 75% Goal status: ONGOING  4.  Pt will implement word-finding strategies with 90% accuracy when unable to verbalize desired word in conversation/functional tasks with min assist.    Baseline: mi/mod assist Goal status: ONGOING  5.  Pt will describe objects and pictures by providing at least three salient features as judged by clinician with 90% acc when provided min cues.  Baseline: 80% Goal status: ONGOING  LONG TERM GOALS: Target date: 12/03/2024  Patient will increase word finding of low frequency info/abstract thoughts with cues using compensatory strategies. Baseline: mild impairment Goal status: ONGOING  2.  Pt will incorporate memory and attention strategies in  functional tasks to [facility]  Baseline: mi/mod assist Goal status: ONGOING   ASSESSMENT:  CLINICAL IMPRESSION: from initial evaluation 10/02/2024) Patient is a 64 y.o. male who was seen today for a cognitive linguistic evaluation following referral from Harlene Bogaert, NP.  Pt has a complicated history in terms of onset of his symptoms of reduced attention, memory, and word finding. He has a remote history of a right parietal lobe infarct (~04/2022), anxiety and depression, and possible head injuries from previous falls from 2022 onward. He lives with his wife and he has been responsible for finances, medication management, cleaning, driving, and picking up foods (light cooking only). He reports getting lost while driving, missing bill payments, and poor attention when trying to read. They recently (6 months ago) moved into a 55+ community and this has provided good support for him and his wife. He presents with mild neurocognitive deficits which may be impacted by anxiety and depression. Recommend short term SLP therapy to address attention, memory, thought organization, and word finding deficits. Pt is motivated to address the above and has good family support.   OBJECTIVE IMPAIRMENTS: include attention, memory, executive functioning, expressive language, and receptive language. These impairments are limiting patient from managing medications, managing appointments, managing finances, and effectively communicating at home and in community. Factors affecting potential to achieve goals and functional outcome are ability to learn/carryover information. Patient will benefit from skilled SLP services to address above impairments and improve overall function.  REHAB POTENTIAL: Good  PLAN:  SLP FREQUENCY: 2x/week  SLP DURATION: 6 weeks  PLANNED INTERVENTIONS: Cueing hierachy, Cognitive reorganization, Internal/external aids, Functional tasks, Multimodal communication approach, SLP instruction and feedback,  Compensatory strategies, Patient/family education, [phone] Treatment of speech (30 or 45 min) , and [id number]- Speech [address], Artic, Phon, Eval Compre, Express   Thank you,  Lamar Candy, CCC-SLP [phone]  Hendrik Donath, CCC-SLP 10/24/2024, 1:41 PM       "

## 2024-10-25 ENCOUNTER — Telehealth: Payer: Self-pay

## 2024-10-25 MED ORDER — MEMANTINE HCL 10 MG PO TABS: 10.0000 mg | ORAL_TABLET | Freq: Two times a day (BID) | ORAL | 3 refills | Status: AC

## 2024-10-25 NOTE — Telephone Encounter (Signed)
 Order placed for Namenda  10mg  twice daily.

## 2024-10-26 ENCOUNTER — Encounter (HOSPITAL_COMMUNITY): Payer: Self-pay | Admitting: Speech Pathology

## 2024-10-26 ENCOUNTER — Ambulatory Visit (HOSPITAL_COMMUNITY): Admitting: Speech Pathology

## 2024-10-26 DIAGNOSIS — R41841 Cognitive communication deficit: Secondary | ICD-10-CM

## 2024-10-26 NOTE — Therapy (Signed)
 " OUTPATIENT SPEECH LANGUAGE PATHOLOGY TREATMENT   Patient Name: Malik Hill MRN: [id number] DOB:10/14/1960, 64 y.o., male Today's Date: 10/26/2024  PCP: [facility] REFERRING PROVIDER: Whitfield Raisin, NP  END OF SESSION:  End of Session - 10/26/24 1039     Visit Number 7    Number of Visits 9    Date for Recertification  11/09/24    Authorization Type [employer]   Eff: 10/06/23  Oop: 4000  Copay 25  Auth yes   SLP Start Time 1030    SLP Stop Time  1115    SLP Time Calculation (min) 45 min    Activity Tolerance Patient tolerated treatment well          Past Medical History:  Diagnosis Date   Anxiety    BPH (benign prostatic hyperplasia)    Cirrhosis (HCC)    Depression    Diabetes mellitus without complication (HCC)    Headache    History of kidney stones    MDD (major depressive disorder)    Memory loss    Osteoarthritis    Stroke (HCC) 09/2021   Past Surgical History:  Procedure Laterality Date   BIOPSY  06/10/2023   Procedure: BIOPSY;  Surgeon: Cinderella Deatrice FALCON, MD;  Location: [facility];  Service: Endoscopy;;   BUNIONECTOMY     COLONOSCOPY  06/09/2007   COLONOSCOPY WITH PROPOFOL  N/A 06/10/2023   Procedure: COLONOSCOPY WITH PROPOFOL ;  Surgeon: Cinderella Deatrice FALCON, MD;  Location: [facility];  Service: Endoscopy;  Laterality: N/A;  9:45am;asa 3   CYSTOSCOPY WITH RETROGRADE PYELOGRAM, URETEROSCOPY AND STENT PLACEMENT Left 01/21/2023   Procedure: CYSTOSCOPY WITH RETROGRADE PYELOGRAM, URETEROSCOPY AND STENT PLACEMENT;  Surgeon: Sherrilee Belvie CROME, MD;  Location: [facility];  Service: Urology;  Laterality: Left;   CYSTOSCOPY WITH RETROGRADE PYELOGRAM, URETEROSCOPY AND STENT PLACEMENT Left 07/01/2023   Procedure: CYSTOSCOPY WITH RETROGRADE PYELOGRAM, URETEROSCOPY AND STENT PLACEMENT;  Surgeon: Sherrilee Belvie CROME, MD;  Location: [facility];  Service: Urology;  Laterality: Left;   CYSTOSCOPY WITH URETHRAL DILATATION N/A 07/03/2024   Procedure: CYSTOSCOPY,  WITH URETHRAL DILATION WITH OPTILUME;  Surgeon: Sherrilee Belvie CROME, MD;  Location: [facility];  Service: Urology;  Laterality: N/A;   ESOPHAGOGASTRODUODENOSCOPY (EGD) WITH PROPOFOL  N/A 06/10/2023   Procedure: ESOPHAGOGASTRODUODENOSCOPY (EGD) WITH PROPOFOL ;  Surgeon: Cinderella Deatrice FALCON, MD;  Location: [facility];  Service: Endoscopy;  Laterality: N/A;  9:45am;asa 3   HOLMIUM LASER APPLICATION Left 07/01/2023   Procedure: HOLMIUM LASER APPLICATION;  Surgeon: Sherrilee Belvie CROME, MD;  Location: [facility];  Service: Urology;  Laterality: Left;   KNEE SURGERY Left 2015   PENILE ADHESIONS LYSIS     POLYPECTOMY  06/10/2023   Procedure: POLYPECTOMY;  Surgeon: Cinderella Deatrice FALCON, MD;  Location: [facility];  Service: Endoscopy;;   TONSILLECTOMY AND ADENOIDECTOMY     age 64   Patient Active Problem List   Diagnosis Date Noted   Thrombocytopenia 08/15/2024   Gastric polyp 06/10/2023   Gastritis and gastroduodenitis 06/10/2023   Adenomatous polyp of transverse colon 06/10/2023   Portal hypertension (HCC) 05/18/2023   BMI 32.0-32.9,adult 05/18/2023   Cirrhosis of liver without ascites (HCC) 05/18/2023   Colon cancer screening 05/18/2023   Nephrolithiasis 01/21/2023   Weakness    Bronchitis    SIRS (systemic inflammatory response syndrome) (HCC) 09/25/2021   DM (diabetes mellitus) (HCC) 09/25/2021   Essential hypertension 09/25/2021   Depression 09/25/2021    ONSET DATE: ~2023   REFERRING DIAG: G31.84 (ICD-10-CM) - Mild  cognitive impairment Z86.73 (ICD-10-CM) - History of stroke  THERAPY DIAG:  Cognitive communication deficit  Rationale for Evaluation and Treatment: Rehabilitation  SUBJECTIVE:   SUBJECTIVE STATEMENT: I haven't felt right all week.  Pt accompanied by: self  PERTINENT HISTORY: Malik Hill is a 64 yo male who was referred by Harlene Bogaert, NP (neurology) for a cognitive linguistic evaluation and treatment due to Pt reports of memory loss, confusion, and word finding  difficulties.  Past medical history is significant for osteoarthritis, stroke, memory loss, history of kidney stones, depression, anxiety, liver disease, BPH, hyperlipidemia, migraine headaches, and obesity, with excessive daytime somnolence as well as chronic difficulty initiating and maintaining sleep, DM2, HTN, HLD, generalized anxiety disorder, and an episode of altered mental status back in December, 2022.  PAIN:  Are you having pain? No  FALLS: Has patient fallen in last 6 months?  Yes, Number of falls: 2x, Comment: fell after tripping over curb, fell out of shower, but now has handles  LIVING ENVIRONMENT: Lives with: lives with their spouse Lives in: House/apartment  PLOF:  Level of assistance: Independent with ADLs, Independent with IADLs Employment: Retired  PATIENT GOALS: Improve attention and focus, not get lost while driving  OBJECTIVE:  Note: Objective measures were completed at Evaluation unless otherwise noted.  DIAGNOSTIC FINDINGS:  MRI Brain 04/06/2022 as per radiology report: Impression 1. No evidence of acute intracranial abnormality. 2. Small chronic cortical/subcortical infarct within the medial right parietal lobe. 3. Prominent perivascular space versus chronic lacunar infarct within the right caudate head. 4. Mild generalized cerebral atrophy. 5. Mild bilateral frontal and ethmoid sinus mucosal thickening. 6. Bilateral mastoid effusions.                                                                                                                      Previous Treatment: Malik Hill brought memantine  medication that Harlene Bogaert prescribed during his visit in December. He indicates that he does not yet see any changes. He had a few headaches and he was encouraged to track those. SLP reviewed the evaluation and goals with Pt and provided written memory strategies. Pt asked to identify barriers or problems and then SLP provided assist to generate possible  solutions. He identified two problems: going to the grocery store and forgetting what he was supposed to be buying for his wife and losing his train of thought in conversation. SLP provided education on memory strategies and guided Pt in finding possible solutions (write it down, discuss items with his wife before he leaves, call his wife, take a photo with his phone, voice memo, notes section of his phone, visual tracking of conversations by writing down key words before or during conversations). Pt asked to write down other problems he encounters at home over the next few days. He does not use a written calendar or take notes, but relies on My Chart on his phone. Continue to target goals.   Previous Treatment: Pt expressed appreciation to SLP for listening and believing him  in regards to the cognitive changes he's noted. He reports feeling more confidence and is implementing strategies we have discussed in sessions. He is using his iPhone to assist with memory (photos, notes, calendar). He skipped his memantine  yesterday and wants to taper off. He was encouraged to call his neurologist to discuss. In session, he completed a 10-item recall and recognition with association cues. He completed the first trial with 4/10 and subsequent trials with 8/10, 9/10, and 10/10. He was then able to recall 7/10 words without association cues. He was given HEP to identify barriers at home. Continue to target goals.  Previous Treatment: Malik Hill states that he decided to continue taking memantine  and his wife notes a reduction in his manic behavior or excessive talking and he reports feeling more alert. He was asked to identify other barriers at home and he stated that he fears committing to events/activities because of his anxiety. He did not go to church on Sunday because of that and reports feeling more comfortable in smaller group settings. SLP encouraged him to give himself grace to let go of events that are socially  challenging for him. He has not had any memory lapses that he can identify since our last session. He described low frequency words with 100% acc in barrier task to SLP with only rare min cues to expand upon descriptors when SLP was unable to ascertain the word. Next session, will complete recall of paragraph length material.   Previous Treatment: Malik Hill continues to report increased confidence and seemingly good tolerance to memantine . In session, he was asked to study a dynamic picture scene (outdoor pool scene) and then describe in detail to SLP while drawing on paper according to his description, and then cover the paper and answer recall questions. SLP drew according to his directions with only minor error for placement of items. He acknowledges that providing accurate description of initial placement would have helped. He then answered recall questions with 100% acc. SLP provided written list of memory/coding systems: visual, auditory, and kinesthetic and reviewed with Pt to ascertain how he learns best. He reports identifying with visual and auditory systems best. He tends to make up a story in his mind and associate with something personal to him to assist in recall. SLP discussed the impact of stress on cognition and he indicates that he tends to spiral or ruminate on perceptions in his mind. SLP suggested that he try to journal on paper or on his phone (can voice record also) when he does this to try and calm his thoughts. He was given a paper to record one memory for day as a start/bridge. Continue plan of care.  Previous Treatment: Pt reports feeling a little off since Saturday after he attended a funeral. He reports feeling like he has lost his confidence. He is still taking the Namenda  and feels conflicted about whether to continue taking it. In our session, we did a quick search to find that sometimes affects may take as long as 3 months. He was encouraged to record/track his feelings at this  time and any potential side effects. In session, he was shown four paired shapes and asked to make an association between the pairings to help him remember the objects. He required min cues to create associations and then was able to recall 4/4 pairings and then draw the corresponding match with 95% acc. He completed a story recall task with 90% acc when asked questions after SLP read him the story. Pt reports  that he has an appointment with a neuropsychologist in early March. Continue to target goals.  TREATMENT DATE: 10/26/24 Pt reports feeling off all week. He feels stressed when he has too many things scheduled during the week. He is still taking Namenda  and refilled it with the doctor. He reports feeling confused about his homework. He completed the first three correctly and stopped. He was asked to explain what he thought he was supposed to do and this was accurate. SLP provided cues for strategies to help complete the divergent naming task according to a specific letter. He then completed the matrix with 90% acc. He benefited from cues to select the letter and figure out what options the next letter can be (G- a, e, h, I, l, o, r, and u) and think of possible words from there. He completed functional memory tasks within session with 100% acc with occasional prompts to remain on task. Continue plan of care.   PATIENT EDUCATION: Education details: deduction task Person educated: Patient Education method: Explanation Education comprehension: verbalized understanding  GOALS: Goals reviewed with patient? Yes  SHORT TERM GOALS: Target date: 11/09/2024  Pt will implement memory strategies in functional therapy activities with 90% acc with min cues. Baseline:70% Goal status: ONGOING  2.  Pt will increase recall for requested information to 90% acc during functional memory exercises with use of compensatory strategies as needed when provided min cues.   Baseline: 50% Goal status: ONGOING  3.   Pt will complete moderately complex attention tasks in session with 90% acc with min assist for use of strategies. Baseline: 75% Goal status: ONGOING  4.  Pt will implement word-finding strategies with 90% accuracy when unable to verbalize desired word in conversation/functional tasks with min assist.    Baseline: mi/mod assist Goal status: ONGOING  5.  Pt will describe objects and pictures by providing at least three salient features as judged by clinician with 90% acc when provided min cues.  Baseline: 80% Goal status: ONGOING  LONG TERM GOALS: Target date: 12/03/2024  Patient will increase word finding of low frequency info/abstract thoughts with cues using compensatory strategies. Baseline: mild impairment Goal status: ONGOING  2.  Pt will incorporate memory and attention strategies in functional tasks to Malik Hill  Baseline: mi/mod assist Goal status: ONGOING   ASSESSMENT:  CLINICAL IMPRESSION: from initial evaluation 10/02/2024) Patient is a 64 y.o. male who was seen today for a cognitive linguistic evaluation following referral from Harlene Bogaert, NP.  Pt has a complicated history in terms of onset of his symptoms of reduced attention, memory, and word finding. He has a remote history of a right parietal lobe infarct (~04/2022), anxiety and depression, and possible head injuries from previous falls from 2022 onward. He lives with his wife and he has been responsible for finances, medication management, cleaning, driving, and picking up foods (light cooking only). He reports getting lost while driving, missing bill payments, and poor attention when trying to read. They recently (6 months ago) moved into a 55+ community and this has provided good support for him and his wife. He presents with mild neurocognitive deficits which may be impacted by anxiety and depression. Recommend short term SLP therapy to address attention, memory, thought organization, and word finding deficits. Pt is motivated  to address the above and has good family support.   OBJECTIVE IMPAIRMENTS: include attention, memory, executive functioning, expressive language, and receptive language. These impairments are limiting patient from managing medications, managing appointments, managing finances, and effectively communicating  at home and in community. Factors affecting potential to achieve goals and functional outcome are ability to learn/carryover information. Patient will benefit from skilled SLP services to address above impairments and improve overall function.  REHAB POTENTIAL: Good  PLAN:  SLP FREQUENCY: 2x/week  SLP DURATION: 6 weeks  PLANNED INTERVENTIONS: Cueing hierachy, Cognitive reorganization, Internal/external aids, Functional tasks, Multimodal communication approach, SLP instruction and feedback, Compensatory strategies, Patient/family education, [phone] Treatment of speech (30 or 45 min) , and [id number]- Speech [address], Artic, Phon, Eval Compre, Express   Thank you,  Malik Hill, CCC-SLP [phone]  Malik Hill, CCC-SLP 10/26/2024, 10:40 AM       "

## 2024-10-31 ENCOUNTER — Encounter (HOSPITAL_COMMUNITY): Payer: Self-pay

## 2024-10-31 ENCOUNTER — Ambulatory Visit (HOSPITAL_COMMUNITY): Admitting: Speech Pathology

## 2024-11-15 ENCOUNTER — Ambulatory Visit: Admitting: Urology

## 2024-12-18 ENCOUNTER — Encounter: Admitting: Psychology

## 2025-04-02 ENCOUNTER — Ambulatory Visit: Admitting: Adult Health

## 2025-04-10 ENCOUNTER — Ambulatory Visit: Admitting: Adult Health
